# Patient Record
Sex: Female | Born: 1960 | Race: White | Hispanic: No | Smoking: Current every day smoker
Health system: Southern US, Community
[De-identification: ages and names within clinical notes are randomized; demographics above are authoritative.]

## PROBLEM LIST (undated history)

## (undated) DIAGNOSIS — J9612 Chronic respiratory failure with hypercapnia: Secondary | ICD-10-CM

## (undated) DIAGNOSIS — I82409 Acute embolism and thrombosis of unspecified deep veins of unspecified lower extremity: Secondary | ICD-10-CM

## (undated) DIAGNOSIS — J449 Chronic obstructive pulmonary disease, unspecified: Secondary | ICD-10-CM

## (undated) DIAGNOSIS — J9611 Chronic respiratory failure with hypoxia: Secondary | ICD-10-CM

## (undated) DIAGNOSIS — I639 Cerebral infarction, unspecified: Secondary | ICD-10-CM

## (undated) DIAGNOSIS — I10 Essential (primary) hypertension: Secondary | ICD-10-CM

## (undated) DIAGNOSIS — E0590 Thyrotoxicosis, unspecified without thyrotoxic crisis or storm: Secondary | ICD-10-CM

## (undated) DIAGNOSIS — G4733 Obstructive sleep apnea (adult) (pediatric): Secondary | ICD-10-CM

## (undated) DIAGNOSIS — I4891 Unspecified atrial fibrillation: Secondary | ICD-10-CM

## (undated) DIAGNOSIS — I50810 Right heart failure, unspecified: Secondary | ICD-10-CM

---

## 2018-12-29 ENCOUNTER — Other Ambulatory Visit: Payer: Self-pay

## 2018-12-29 ENCOUNTER — Emergency Department (HOSPITAL_BASED_OUTPATIENT_CLINIC_OR_DEPARTMENT_OTHER)
Admission: EM | Admit: 2018-12-29 | Discharge: 2018-12-29 | Discharge status: Against Medical Advice | Payer: Self-pay | Attending: Emergency Medicine | Admitting: Emergency Medicine

## 2018-12-29 ENCOUNTER — Emergency Department (HOSPITAL_BASED_OUTPATIENT_CLINIC_OR_DEPARTMENT_OTHER): Payer: Self-pay

## 2018-12-29 ENCOUNTER — Encounter (HOSPITAL_BASED_OUTPATIENT_CLINIC_OR_DEPARTMENT_OTHER): Payer: Self-pay | Admitting: Radiology

## 2018-12-29 DIAGNOSIS — J441 Chronic obstructive pulmonary disease with (acute) exacerbation: Secondary | ICD-10-CM | POA: Insufficient documentation

## 2018-12-29 DIAGNOSIS — Z79899 Other long term (current) drug therapy: Secondary | ICD-10-CM | POA: Insufficient documentation

## 2018-12-29 DIAGNOSIS — Z20828 Contact with and (suspected) exposure to other viral communicable diseases: Secondary | ICD-10-CM | POA: Insufficient documentation

## 2018-12-29 DIAGNOSIS — R600 Localized edema: Secondary | ICD-10-CM | POA: Insufficient documentation

## 2018-12-29 DIAGNOSIS — J9601 Acute respiratory failure with hypoxia: Secondary | ICD-10-CM | POA: Insufficient documentation

## 2018-12-29 LAB — TROPONIN I (HIGH SENSITIVITY)
Troponin I (High Sensitivity): 6 ng/L (ref <18)
Troponin I (High Sensitivity): 6 ng/L (ref <18)

## 2018-12-29 LAB — CBC WITH DIFFERENTIAL/PLATELET
Abs Immature Granulocytes: 0.02 10*3/uL (ref 0.00–0.07)
Basophils Absolute: 0 10*3/uL (ref 0.0–0.1)
Basophils Relative: 0 %
Eosinophils Absolute: 0.2 10*3/uL (ref 0.0–0.5)
Eosinophils Relative: 2 %
HCT: 50.1 % — ABNORMAL HIGH (ref 36.0–46.0)
Hemoglobin: 14.8 g/dL (ref 12.0–15.0)
Immature Granulocytes: 0 %
Lymphocytes Relative: 18 %
Lymphs Abs: 1.4 10*3/uL (ref 0.7–4.0)
MCH: 25.5 pg — ABNORMAL LOW (ref 26.0–34.0)
MCHC: 29.5 g/dL — ABNORMAL LOW (ref 30.0–36.0)
MCV: 86.2 fL (ref 80.0–100.0)
Monocytes Absolute: 0.6 10*3/uL (ref 0.1–1.0)
Monocytes Relative: 7 %
Neutro Abs: 5.6 10*3/uL (ref 1.7–7.7)
Neutrophils Relative %: 73 %
Platelets: 223 10*3/uL (ref 150–400)
RBC: 5.81 MIL/uL — ABNORMAL HIGH (ref 3.87–5.11)
RDW: 13.9 % (ref 11.5–15.5)
WBC: 7.8 10*3/uL (ref 4.0–10.5)
nRBC: 0 % (ref 0.0–0.2)

## 2018-12-29 LAB — COMPREHENSIVE METABOLIC PANEL
ALT: 18 U/L (ref 0–44)
AST: 19 U/L (ref 15–41)
Albumin: 3.6 g/dL (ref 3.5–5.0)
Alkaline Phosphatase: 74 U/L (ref 38–126)
Anion gap: 9 (ref 5–15)
BUN: 9 mg/dL (ref 6–20)
CO2: 30 mmol/L (ref 22–32)
Calcium: 8.8 mg/dL — ABNORMAL LOW (ref 8.9–10.3)
Chloride: 102 mmol/L (ref 98–111)
Creatinine, Ser: 0.55 mg/dL (ref 0.44–1.00)
GFR calc Af Amer: >60 mL/min (ref >60)
GFR calc non Af Amer: >60 mL/min (ref >60)
Glucose, Bld: 141 mg/dL — ABNORMAL HIGH (ref 70–99)
Potassium: 4 mmol/L (ref 3.5–5.1)
Sodium: 141 mmol/L (ref 135–145)
Total Bilirubin: 0.9 mg/dL (ref 0.3–1.2)
Total Protein: 6.7 g/dL (ref 6.5–8.1)

## 2018-12-29 LAB — BRAIN NATRIURETIC PEPTIDE: B Natriuretic Peptide: 51.8 pg/mL (ref 0.0–100.0)

## 2018-12-29 LAB — SARS CORONAVIRUS 2 AG (30 MIN TAT): SARS Coronavirus 2 Ag: NEGATIVE

## 2018-12-29 MED ORDER — METHYLPREDNISOLONE SODIUM SUCC 125 MG IJ SOLR
125.0000 mg | Freq: Once | INTRAMUSCULAR | Status: AC
  Administered 2018-12-29: 19:00:00 125 mg via INTRAVENOUS
  Filled 2018-12-29: qty 2

## 2018-12-29 MED ORDER — IOHEXOL 350 MG/ML SOLN
100.0000 mL | Freq: Once | INTRAVENOUS | Status: AC | PRN
  Administered 2018-12-29: 100 mL via INTRAVENOUS

## 2018-12-29 MED ORDER — ALBUTEROL SULFATE HFA 108 (90 BASE) MCG/ACT IN AERS
8.0000 | INHALATION_SPRAY | Freq: Once | RESPIRATORY_TRACT | Status: AC
  Administered 2018-12-29: 19:00:00 8 via RESPIRATORY_TRACT
  Filled 2018-12-29: qty 6.7

## 2018-12-29 MED ORDER — PREDNISONE 20 MG PO TABS: 60.0000 mg | ORAL_TABLET | Freq: Every day | ORAL | 0 refills | Status: AC

## 2018-12-29 NOTE — ED Notes (Signed)
Pt on monitor 

## 2018-12-29 NOTE — ED Notes (Addendum)
Attempted to place 20g IV to RAC- unsuccessful. Ct informed

## 2018-12-29 NOTE — ED Notes (Signed)
Pt refusing to stay attached to monitor- states "I am ready to go, I am done with all of this". Pt explained we were monitoring her vitals due to the hypoxia earlier. RN explained she was waiting on ultrasound and she was next. Pt states "I want to go now" EDP informed.

## 2018-12-29 NOTE — ED Triage Notes (Signed)
Pt reports left ankle edema x 2 months, intermittent shortness of breath with ambulation, heavy smoker . denies chest pain nor cough , no fever. Alert and oriented x 4. Pt got hypoxic after walking from triage to treatment room

## 2018-12-29 NOTE — ED Notes (Signed)
ED Provider at bedside. 

## 2018-12-29 NOTE — ED Provider Notes (Signed)
[facility] EMERGENCY DEPARTMENT Provider Note   CSN: [id number] Arrival date & time: 12/29/18  1614    History   Chief Complaint Chief Complaint  Patient presents with  . Shortness of Breath  . Ankle Pain    edema    HPI Yvette MolaSharon Booth is a 58 y.o. female.     The history is provided by the patient.  Shortness of Breath Severity:  Moderate Onset quality:  Gradual Timing:  Constant Progression:  Unchanged Chronicity:  New Context: activity and smoke exposure   Relieved by:  Inhaler Worsened by:  Activity Associated symptoms: wheezing   Associated symptoms: no abdominal pain, no chest pain, no cough, no ear pain, no fever, no rash, no sore throat and no vomiting   Associated symptoms comment:  Left lower leg swelling for the last several months as well Risk factors: no hx of PE/DVT     History reviewed. No pertinent past medical history.  There are no active problems to display for this patient.   History reviewed. No pertinent surgical history.   OB History   No obstetric history on file.      Home Medications    Prior to Admission medications   Medication Sig Start Date End Date Taking? Authorizing Provider  predniSONE (DELTASONE) 20 MG tablet Take 3 tablets (60 mg total) by mouth daily for 5 days. 12/29/18 01/03/19  Virgina Norfolkuratolo, Tresa Jolley, DO    Family History No family history on file.  Social History Social History   Tobacco Use  . Smoking status: Not on file  Substance Use Topics  . Alcohol use: Not on file  . Drug use: Not on file     Allergies   Patient has no known allergies.   Review of Systems Review of Systems  Constitutional: Negative for chills and fever.  HENT: Negative for ear pain and sore throat.   Eyes: Negative for pain and visual disturbance.  Respiratory: Positive for shortness of breath and wheezing. Negative for cough.   Cardiovascular: Positive for leg swelling. Negative for chest pain and palpitations.   Gastrointestinal: Negative for abdominal pain and vomiting.  Genitourinary: Negative for dysuria and hematuria.  Musculoskeletal: Negative for arthralgias and back pain.  Skin: Negative for color change and rash.  Neurological: Negative for seizures and syncope.  All other systems reviewed and are negative.    Physical Exam Updated Vital Signs  ED Triage Vitals  Enc Vitals Group     BP 12/29/18 1636 (!) 159/82     Pulse Rate 12/29/18 1636 99     Resp 12/29/18 1636 (!) 32     Temp 12/29/18 1636 99.1 F (37.3 C)     Temp Source 12/29/18 1636 Oral     SpO2 12/29/18 1636 98 %     Weight --      Height --      Head Circumference --      Peak Flow --      Pain Score 12/29/18 1646 0     Pain Loc --      Pain Edu? --      Excl. in GC? --     Physical Exam Vitals signs and nursing note reviewed.  Constitutional:      General: She is not in acute distress.    Appearance: She is well-developed.  HENT:     Head: Normocephalic and atraumatic.     Mouth/Throat:     Mouth: Mucous membranes are moist.  Eyes:  Conjunctiva/sclera: Conjunctivae normal.     Pupils: Pupils are equal, round, and reactive to light.  Neck:     Musculoskeletal: Normal range of motion and neck supple.  Cardiovascular:     Rate and Rhythm: Normal rate and regular rhythm.     Pulses: Normal pulses.     Heart sounds: Normal heart sounds. No murmur.  Pulmonary:     Effort: Tachypnea present. No respiratory distress.     Breath sounds: Decreased breath sounds and wheezing present.  Abdominal:     Palpations: Abdomen is soft.     Tenderness: There is no abdominal tenderness.  Musculoskeletal:     Right lower leg: She exhibits no tenderness. No edema.     Left lower leg: She exhibits tenderness. Edema present.  Skin:    General: Skin is warm and dry.     Capillary Refill: Capillary refill takes less than 2 seconds.  Neurological:     General: No focal deficit present.     Mental Status: She is  alert.      ED Treatments / Results  Labs (all labs ordered are listed, but only abnormal results are displayed) Labs Reviewed  CBC WITH DIFFERENTIAL/PLATELET - Abnormal; Notable for the following components:      Result Value   RBC 5.81 (*)    HCT 50.1 (*)    MCH 25.5 (*)    MCHC 29.5 (*)    All other components within normal limits  COMPREHENSIVE METABOLIC PANEL - Abnormal; Notable for the following components:   Glucose, Bld 141 (*)    Calcium 8.8 (*)    All other components within normal limits  SARS CORONAVIRUS 2 (HOSP ORDER, PERFORMED IN [place] LAB VIA ABBOTT ID)  TROPONIN I (HIGH SENSITIVITY)  BRAIN NATRIURETIC PEPTIDE  TROPONIN I (HIGH SENSITIVITY)    EKG EKG Interpretation  Date/Time:  Thursday December 29 2018 16:34:23 EDT Ventricular Rate:  101 PR Interval:    QRS Duration: 81 QT Interval:  351 QTC Calculation: 455 R Axis:   97 Text Interpretation:  Sinus tachycardia Biatrial enlargement Borderline right axis deviation Probable anteroseptal infarct, old Confirmed by Virgina NorfolkAdam, Sherwood Castilla [phone]([id number]) on 12/29/2018 5:04:05 PM   Radiology Ct Angio Chest Pe W And/or Wo Contrast  Result Date: 12/29/2018 CLINICAL DATA:  Chest pain EXAM: CT ANGIOGRAPHY CHEST WITH CONTRAST TECHNIQUE: Multidetector CT imaging of the chest was performed using the standard protocol during bolus administration of intravenous contrast. Multiplanar CT image reconstructions and MIPs were obtained to evaluate the vascular anatomy. CONTRAST:  100mL OMNIPAQUE IOHEXOL 350 MG/ML SOLN COMPARISON:  Radiograph 12/29/2018 FINDINGS: Cardiovascular: Satisfactory opacification of the pulmonary arteries to the segmental level. No evidence of pulmonary embolism. Normal heart size. No pericardial effusion. Nonaneurysmal aorta. No dissection is seen. Mediastinum/Nodes: Midline trachea. Heterogenous thyroid with multiple nodules, measuring up to 2.3 cm on the left. Scattered parenchymal calcifications. No significantly  enlarged lymph nodes. Esophagus within normal limits. Lungs/Pleura: Severe emphysema. No acute airspace disease or effusion. Subpleural 4 mm right lower lobe pulmonary nodule, series 5, image number 58. Upper Abdomen: 15 mm fat density left adrenal mass. 2.2 cm fat density right adrenal mass. No acute abnormalities in the upper abdomen. Musculoskeletal: No chest wall abnormality. No acute or significant osseous findings. Review of the MIP images confirms the above findings. IMPRESSION: 1. No CT evidence for acute pulmonary embolus or aortic dissection. 2. Severe emphysema without acute pulmonary infiltrate. 3. 4 mm right lower lobe pulmonary nodule. No follow-up needed if patient  is low-risk. Non-contrast chest CT can be considered in 12 months if patient is high-risk. This recommendation follows the consensus statement: Guidelines for Management of Incidental Pulmonary Nodules Detected on CT Images: From the Fleischner Society 2017; Radiology 2017; 284:228-243. 4. Heterogenous thyroid with multiple nodules, consider correlation with nonemergent thyroid ultrasound 5. Bilateral adrenal masses, consistent with adenoma Emphysema (ICD10-J43.9). Electronically Signed   By: Jasmine PangKim  Fujinaga M.D.   On: 12/29/2018 18:24   Dg Chest Portable 1 View  Result Date: 12/29/2018 CLINICAL DATA:  Shortness of breath. EXAM: PORTABLE CHEST 1 VIEW COMPARISON:  None. FINDINGS: 1643 hours. Lungs are hyperexpanded. Interstitial markings are diffusely coarsened with chronic features. Cardiopericardial silhouette is at upper limits of normal for size. Hila are prominent bilaterally although likely related to vasculature. Bones are diffusely demineralized. Telemetry leads overlie the chest. IMPRESSION: 1. Hyperexpansion with underlying chronic interstitial changes. No definite acute cardiopulmonary process. Electronically Signed   By: Kennith CenterEric  Mansell M.D.   On: 12/29/2018 17:50    Procedures .Critical Care Performed by: Virgina Norfolkuratolo, Jereld Presti, DO  Authorized by: Virgina Norfolkuratolo, Shaniah Baltes, DO   Critical care provider statement:    Critical care time (minutes):  35   Critical care time was exclusive of:  Separately billable procedures and treating other patients and teaching time   Critical care was necessary to treat or prevent imminent or life-threatening deterioration of the following conditions:  Respiratory failure   Critical care was time spent personally by me on the following activities:  Blood draw for specimens, development of treatment plan with patient or surrogate, discussions with primary provider, evaluation of patient's response to treatment, examination of patient, obtaining history from patient or surrogate, ordering and performing treatments and interventions, ordering and review of laboratory studies, ordering and review of radiographic studies, pulse oximetry, re-evaluation of patient's condition and review of old charts   I assumed direction of critical care for this patient from another provider in my specialty: no     (including critical care time)  Medications Ordered in ED Medications  iohexol (OMNIPAQUE) 350 MG/ML injection 100 mL (100 mLs Intravenous Contrast Given 12/29/18 1752)  albuterol (VENTOLIN HFA) 108 (90 Base) MCG/ACT inhaler 8 puff (8 puffs Inhalation Given 12/29/18 1852)  methylPREDNISolone sodium succinate (SOLU-MEDROL) 125 mg/2 mL injection 125 mg (125 mg Intravenous Given 12/29/18 1849)     Initial Impression / Assessment and Plan / ED Course  I have reviewed the triage vital signs and the nursing notes.  Pertinent labs & imaging results that were available during my care of the patient were reviewed by me and considered in my medical decision making (see chart for details).     Yvette MolaSharon Slaugh is a 58 year old female history of thyroid disorder, COPD who presents to the ED with shortness of breath, left leg swelling.  Patient found to be hypoxic and improved on 4 L of oxygen.  Patient with wheezing throughout  on exam.  No fever, no cough.  Patient has had left lower leg swelling for the last several months.  Has not seek medical attention for this.  Patient continues to smoke daily.  Has been using inhaler with no relief.  No history of blood clots.  No chest pain.  EKG shows sinus tachycardia.  No ischemic changes.  Concern for possible PE given left leg swelling, shortness of breath.  Possible COPD exacerbation.  Will give albuterol.  Will start with a CT scan of the chest.  May need a DVT study.  Less likely infectious process  in the left lower leg given the duration of symptoms.  No fever.  PE scan shows no pneumonia, no PE.  Emphysema changes.  Patient has some pulmonary nodules that she was made aware of and will need follow-up CT scans.  Patient with no significant leukocytosis, anemia.  No significant kidney injury.  BNP normal.  Doubt heart failure.  Troponin within normal limits.  Doubt ACS.  Coronavirus test negative.  Will give further breathing treatments, IV Solu-Medrol.  Patient still continues to require oxygen.  Will get a DVT study.  Patient does not want to stay for admission for COPD despite new oxygen requirement.  Will reevaluate her after DVT scan and further breathing treatments.  Patient wants to leave [place].  She does not want a stay for DVT scan.  She does not want to be admitted for acute hypoxic respiratory failure likely due to COPD exacerbation.  She has capacity to make this decision.  Will give patient prednisone prescription.  Recommend that she aggressively use her inhaler.  Told her to come back to the ED if she changes her mind.  Patient discharged [place].  This chart was dictated using voice recognition software.  Despite best efforts to proofread,  errors can occur which can change the documentation meaning.    Final Clinical Impressions(s) / ED Diagnoses   Final diagnoses:  COPD exacerbation ([place])  Acute respiratory failure with hypoxia  [facility])    ED Discharge Orders         Ordered    predniSONE (DELTASONE) 20 MG tablet  Daily     12/29/18 1947           Trigo Winterbottom, [place], DO 12/29/18 1949

## 2018-12-29 NOTE — ED Notes (Signed)
Provided discharge paperwork despite AMA status, given prednisone RX. Encouraged patient to follow up with PCP as soon as possible, and to call 911 if she feels like she felt earlier today. Pt escorted to check out area via wheelchair by EMT.

## 2019-11-05 IMAGING — CT CT ANGIOGRAPHY CHEST
1 of 8 series · 3 of 16 positions shown · IV contrast (omnipaque)
Comparison: Radiograph 12/29/2018

CLINICAL DATA: Chest pain

EXAM:
CT ANGIOGRAPHY CHEST WITH CONTRAST
TECHNIQUE: Multidetector CT imaging of the chest was performed using the
standard protocol during bolus administration of intravenous
contrast. Multiplanar CT image reconstructions and MIPs were
obtained to evaluate the vascular anatomy.
CONTRAST:  100mL OMNIPAQUE IOHEXOL 350 MG/ML SOLN

[Series 6: pe thins · axial · 0.80mm/px · z∈[-304,-3]mm · 3 of 302 slices shown]
[im 1/302  lung]
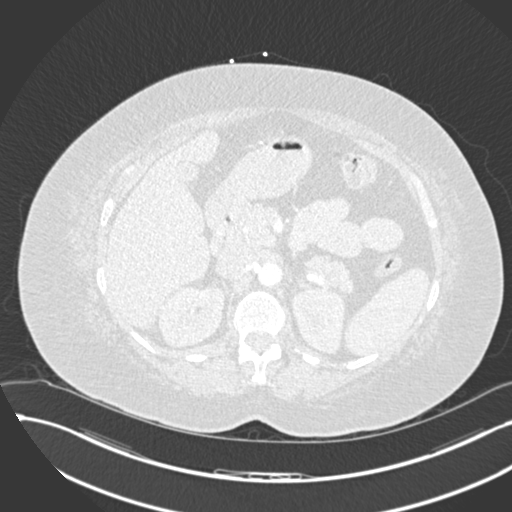
[im 151/302  soft-tissue]
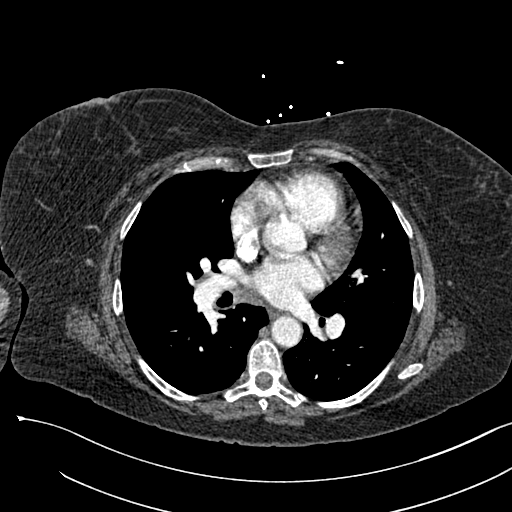
[im 302/302  lung]
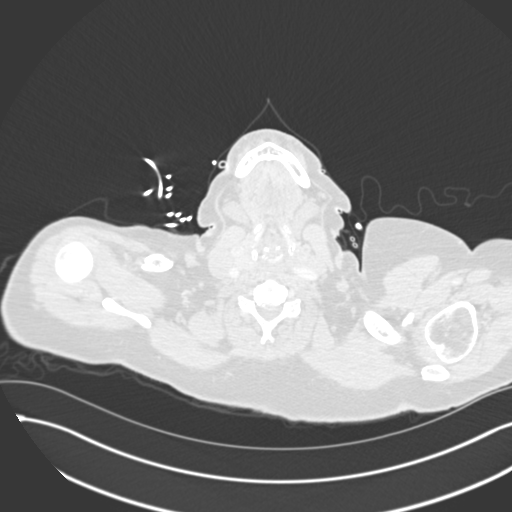

[3 of 16 positions shown; findings below may reference images not displayed]

FINDINGS: Cardiovascular: Satisfactory opacification of the pulmonary arteries
to the segmental level. No evidence of pulmonary embolism. Normal
heart size. No pericardial effusion. Nonaneurysmal aorta. No
dissection is seen.

Mediastinum/Nodes: Midline trachea. Heterogenous thyroid with
multiple nodules, measuring up to 2.3 cm on the left. Scattered
parenchymal calcifications. No significantly enlarged lymph nodes.
Esophagus within normal limits.

Lungs/Pleura: Severe emphysema. No acute airspace disease or
effusion. Subpleural 4 mm right lower lobe pulmonary nodule, series
5, image number 58.

Upper Abdomen: 15 mm fat density left adrenal mass. 2.2 cm fat
density right adrenal mass. No acute abnormalities in the upper
abdomen.

Musculoskeletal: No chest wall abnormality. No acute or significant
osseous findings.

Review of the MIP images confirms the above findings.
IMPRESSION: 1. No CT evidence for acute pulmonary embolus or aortic dissection.
2. Severe emphysema without acute pulmonary infiltrate.
3. 4 mm right lower lobe pulmonary nodule. No follow-up needed if
patient is low-risk. Non-contrast chest CT can be considered in 12
months if patient is high-risk. This recommendation follows the
consensus statement: Guidelines for Management of Incidental
Pulmonary Nodules Detected on CT Images: From the [HOSPITAL]
4. Heterogenous thyroid with multiple nodules, consider correlation
with nonemergent thyroid ultrasound
5. Bilateral adrenal masses, consistent with adenoma

Emphysema ([id number]).

## 2023-08-20 ENCOUNTER — Encounter (HOSPITAL_COMMUNITY): Payer: Self-pay | Admitting: Internal Medicine

## 2023-08-20 ENCOUNTER — Emergency Department (HOSPITAL_COMMUNITY): Payer: Medicare HMO

## 2023-08-20 ENCOUNTER — Inpatient Hospital Stay (HOSPITAL_COMMUNITY)
Admission: EM | Admit: 2023-08-20 | Discharge: 2023-08-23 | DRG: 189 | Disposition: A | Discharge status: Home / Self Care | Payer: Medicare HMO | Attending: Internal Medicine | Admitting: Internal Medicine

## 2023-08-20 ENCOUNTER — Other Ambulatory Visit: Payer: Self-pay

## 2023-08-20 DIAGNOSIS — I110 Hypertensive heart disease with heart failure: Secondary | ICD-10-CM | POA: Diagnosis present

## 2023-08-20 DIAGNOSIS — Z7901 Long term (current) use of anticoagulants: Secondary | ICD-10-CM | POA: Diagnosis not present

## 2023-08-20 DIAGNOSIS — G4733 Obstructive sleep apnea (adult) (pediatric): Secondary | ICD-10-CM | POA: Diagnosis present

## 2023-08-20 DIAGNOSIS — E0590 Thyrotoxicosis, unspecified without thyrotoxic crisis or storm: Secondary | ICD-10-CM | POA: Diagnosis present

## 2023-08-20 DIAGNOSIS — J439 Emphysema, unspecified: Secondary | ICD-10-CM | POA: Diagnosis present

## 2023-08-20 DIAGNOSIS — Z1152 Encounter for screening for COVID-19: Secondary | ICD-10-CM | POA: Diagnosis not present

## 2023-08-20 DIAGNOSIS — J9622 Acute and chronic respiratory failure with hypercapnia: Principal | ICD-10-CM | POA: Diagnosis present

## 2023-08-20 DIAGNOSIS — J9621 Acute and chronic respiratory failure with hypoxia: Secondary | ICD-10-CM | POA: Diagnosis present

## 2023-08-20 DIAGNOSIS — J441 Chronic obstructive pulmonary disease with (acute) exacerbation: Secondary | ICD-10-CM | POA: Diagnosis not present

## 2023-08-20 DIAGNOSIS — T380X5A Adverse effect of glucocorticoids and synthetic analogues, initial encounter: Secondary | ICD-10-CM | POA: Diagnosis not present

## 2023-08-20 DIAGNOSIS — Z8673 Personal history of transient ischemic attack (TIA), and cerebral infarction without residual deficits: Secondary | ICD-10-CM

## 2023-08-20 DIAGNOSIS — Z716 Tobacco abuse counseling: Secondary | ICD-10-CM

## 2023-08-20 DIAGNOSIS — R739 Hyperglycemia, unspecified: Secondary | ICD-10-CM | POA: Diagnosis not present

## 2023-08-20 DIAGNOSIS — I50810 Right heart failure, unspecified: Secondary | ICD-10-CM | POA: Diagnosis present

## 2023-08-20 DIAGNOSIS — I480 Paroxysmal atrial fibrillation: Secondary | ICD-10-CM | POA: Diagnosis present

## 2023-08-20 DIAGNOSIS — F17210 Nicotine dependence, cigarettes, uncomplicated: Secondary | ICD-10-CM | POA: Diagnosis present

## 2023-08-20 DIAGNOSIS — Z79899 Other long term (current) drug therapy: Secondary | ICD-10-CM | POA: Diagnosis not present

## 2023-08-20 HISTORY — DX: Chronic respiratory failure with hypoxia: J96.11

## 2023-08-20 HISTORY — DX: Chronic respiratory failure with hypercapnia: J96.12

## 2023-08-20 HISTORY — DX: Unspecified atrial fibrillation: I48.91

## 2023-08-20 HISTORY — DX: Essential (primary) hypertension: I10

## 2023-08-20 HISTORY — DX: Acute embolism and thrombosis of unspecified deep veins of unspecified lower extremity: I82.409

## 2023-08-20 HISTORY — DX: Obstructive sleep apnea (adult) (pediatric): G47.33

## 2023-08-20 HISTORY — DX: Chronic obstructive pulmonary disease, unspecified: J44.9

## 2023-08-20 HISTORY — DX: Thyrotoxicosis, unspecified without thyrotoxic crisis or storm: E05.90

## 2023-08-20 HISTORY — DX: Cerebral infarction, unspecified: I63.9

## 2023-08-20 HISTORY — DX: Right heart failure, unspecified: I50.810

## 2023-08-20 LAB — COMPREHENSIVE METABOLIC PANEL
ALT: 9 U/L (ref 0–44)
AST: 11 U/L — ABNORMAL LOW (ref 15–41)
Albumin: 3.7 g/dL (ref 3.5–5.0)
Alkaline Phosphatase: 67 U/L (ref 38–126)
Anion gap: 9 (ref 5–15)
BUN: 15 mg/dL (ref 8–23)
CO2: 42 mmol/L — ABNORMAL HIGH (ref 22–32)
Calcium: 9 mg/dL (ref 8.9–10.3)
Chloride: 90 mmol/L — ABNORMAL LOW (ref 98–111)
Creatinine, Ser: 0.64 mg/dL (ref 0.44–1.00)
GFR, Estimated: >60 mL/min (ref >60)
Glucose, Bld: 135 mg/dL — ABNORMAL HIGH (ref 70–99)
Potassium: 4.4 mmol/L (ref 3.5–5.1)
Sodium: 141 mmol/L (ref 135–145)
Total Bilirubin: 0.5 mg/dL (ref 0.0–1.2)
Total Protein: 6.9 g/dL (ref 6.5–8.1)

## 2023-08-20 LAB — CBC WITH DIFFERENTIAL/PLATELET
Abs Immature Granulocytes: 0.02 10*3/uL (ref 0.00–0.07)
Basophils Absolute: 0 10*3/uL (ref 0.0–0.1)
Basophils Relative: 0 %
Eosinophils Absolute: 0.1 10*3/uL (ref 0.0–0.5)
Eosinophils Relative: 1 %
HCT: 49.3 % — ABNORMAL HIGH (ref 36.0–46.0)
Hemoglobin: 15 g/dL (ref 12.0–15.0)
Immature Granulocytes: 0 %
Lymphocytes Relative: 21 %
Lymphs Abs: 1.4 10*3/uL (ref 0.7–4.0)
MCH: 29.2 pg (ref 26.0–34.0)
MCHC: 30.4 g/dL (ref 30.0–36.0)
MCV: 96.1 fL (ref 80.0–100.0)
Monocytes Absolute: 0.4 10*3/uL (ref 0.1–1.0)
Monocytes Relative: 6 %
Neutro Abs: 4.7 10*3/uL (ref 1.7–7.7)
Neutrophils Relative %: 72 %
Platelets: 229 10*3/uL (ref 150–400)
RBC: 5.13 MIL/uL — ABNORMAL HIGH (ref 3.87–5.11)
RDW: 13 % (ref 11.5–15.5)
WBC: 6.6 10*3/uL (ref 4.0–10.5)
nRBC: 0 % (ref 0.0–0.2)

## 2023-08-20 LAB — BLOOD GAS, VENOUS
Acid-Base Excess: 16.9 mmol/L — ABNORMAL HIGH (ref 0.0–2.0)
Bicarbonate: 48.6 mmol/L — ABNORMAL HIGH (ref 20.0–28.0)
O2 Saturation: 68.4 %
Patient temperature: 37
pCO2, Ven: 101 mm[Hg] (ref 44–60)
pH, Ven: 7.29 (ref 7.25–7.43)
pO2, Ven: 32 mm[Hg] (ref 32–45)

## 2023-08-20 LAB — TROPONIN I (HIGH SENSITIVITY)
Troponin I (High Sensitivity): 3 ng/L (ref <18)
Troponin I (High Sensitivity): 3 ng/L (ref <18)

## 2023-08-20 LAB — RESP PANEL BY RT-PCR (RSV, FLU A&B, COVID)  RVPGX2
Influenza A by PCR: NEGATIVE
Influenza B by PCR: NEGATIVE
Resp Syncytial Virus by PCR: NEGATIVE
SARS Coronavirus 2 by RT PCR: NEGATIVE

## 2023-08-20 MED ORDER — IPRATROPIUM-ALBUTEROL 0.5-2.5 (3) MG/3ML IN SOLN
3.0000 mL | Freq: Once | RESPIRATORY_TRACT | Status: AC
Start: 2023-08-20 — End: 2023-08-20
  Administered 2023-08-20: 3 mL via RESPIRATORY_TRACT
  Filled 2023-08-20: qty 3

## 2023-08-20 MED ORDER — METHYLPREDNISOLONE SODIUM SUCC 125 MG IJ SOLR
125.0000 mg | Freq: Once | INTRAMUSCULAR | Status: AC
Start: 2023-08-20 — End: 2023-08-20
  Administered 2023-08-20: 125 mg via INTRAVENOUS
  Filled 2023-08-20: qty 2

## 2023-08-20 MED ORDER — SODIUM CHLORIDE 0.9 % IV SOLN
500.0000 mg | Freq: Once | INTRAVENOUS | Status: AC
  Administered 2023-08-20: 500 mg via INTRAVENOUS
  Filled 2023-08-20: qty 5

## 2023-08-20 MED ORDER — UMECLIDINIUM BROMIDE 62.5 MCG/ACT IN AEPB
1.0000 | INHALATION_SPRAY | Freq: Every day | RESPIRATORY_TRACT | Status: DC
Start: 2023-08-21 — End: 2023-08-23
  Administered 2023-08-21 – 2023-08-23 (×3): 1 via RESPIRATORY_TRACT
  Filled 2023-08-20: qty 7

## 2023-08-20 MED ORDER — ATORVASTATIN CALCIUM 10 MG PO TABS
10.0000 mg | ORAL_TABLET | Freq: Every day | ORAL | Status: DC
  Administered 2023-08-21 – 2023-08-23 (×3): 10 mg via ORAL
  Filled 2023-08-20 (×3): qty 1

## 2023-08-20 MED ORDER — NICOTINE POLACRILEX 2 MG MT GUM: 2.0000 mg | CHEWING_GUM | OROMUCOSAL | Status: DC | PRN

## 2023-08-20 MED ORDER — NICOTINE 14 MG/24HR TD PT24
14.0000 mg | MEDICATED_PATCH | Freq: Every day | TRANSDERMAL | Status: DC
  Administered 2023-08-21 – 2023-08-23 (×3): 14 mg via TRANSDERMAL
  Filled 2023-08-20 (×3): qty 1

## 2023-08-20 MED ORDER — ONDANSETRON HCL 4 MG/2ML IJ SOLN: 4.0000 mg | Freq: Four times a day (QID) | INTRAMUSCULAR | Status: DC | PRN

## 2023-08-20 MED ORDER — IPRATROPIUM-ALBUTEROL 0.5-2.5 (3) MG/3ML IN SOLN: 9.0000 mL | Freq: Once | RESPIRATORY_TRACT | Status: DC

## 2023-08-20 MED ORDER — FLUTICASONE FUROATE-VILANTEROL 200-25 MCG/ACT IN AEPB
1.0000 | INHALATION_SPRAY | Freq: Every day | RESPIRATORY_TRACT | Status: DC
  Administered 2023-08-21 – 2023-08-23 (×3): 1 via RESPIRATORY_TRACT
  Filled 2023-08-20: qty 28

## 2023-08-20 MED ORDER — MELATONIN 3 MG PO TABS
6.0000 mg | ORAL_TABLET | Freq: Every evening | ORAL | Status: DC | PRN
  Administered 2023-08-21: 6 mg via ORAL
  Filled 2023-08-20: qty 2

## 2023-08-20 MED ORDER — SODIUM CHLORIDE 0.9 % IV SOLN
1.0000 g | Freq: Once | INTRAVENOUS | Status: AC
  Administered 2023-08-20: 1 g via INTRAVENOUS
  Filled 2023-08-20: qty 10

## 2023-08-20 MED ORDER — MONTELUKAST SODIUM 10 MG PO TABS
10.0000 mg | ORAL_TABLET | Freq: Every day | ORAL | Status: DC
  Administered 2023-08-21 – 2023-08-22 (×2): 10 mg via ORAL
  Filled 2023-08-20 (×2): qty 1

## 2023-08-20 MED ORDER — APIXABAN 5 MG PO TABS
5.0000 mg | ORAL_TABLET | Freq: Two times a day (BID) | ORAL | Status: DC
  Administered 2023-08-21 – 2023-08-23 (×5): 5 mg via ORAL
  Filled 2023-08-20 (×5): qty 1

## 2023-08-20 MED ORDER — METOPROLOL SUCCINATE ER 50 MG PO TB24
50.0000 mg | ORAL_TABLET | Freq: Every day | ORAL | Status: DC
  Administered 2023-08-21 – 2023-08-23 (×3): 50 mg via ORAL
  Filled 2023-08-20 (×3): qty 1

## 2023-08-20 MED ORDER — POLYETHYLENE GLYCOL 3350 17 G PO PACK: 17.0000 g | PACK | Freq: Every day | ORAL | Status: DC | PRN

## 2023-08-20 MED ORDER — LEVOFLOXACIN 500 MG PO TABS
500.0000 mg | ORAL_TABLET | Freq: Every day | ORAL | Status: DC
  Administered 2023-08-21 – 2023-08-22 (×2): 500 mg via ORAL
  Filled 2023-08-20 (×2): qty 1

## 2023-08-20 MED ORDER — IPRATROPIUM-ALBUTEROL 0.5-2.5 (3) MG/3ML IN SOLN
3.0000 mL | Freq: Four times a day (QID) | RESPIRATORY_TRACT | Status: DC
  Administered 2023-08-20 – 2023-08-23 (×10): 3 mL via RESPIRATORY_TRACT
  Filled 2023-08-20 (×12): qty 3

## 2023-08-20 MED ORDER — METHYLPREDNISOLONE SODIUM SUCC 125 MG IJ SOLR
60.0000 mg | Freq: Two times a day (BID) | INTRAMUSCULAR | Status: DC
  Administered 2023-08-21 – 2023-08-23 (×5): 60 mg via INTRAVENOUS
  Filled 2023-08-20 (×6): qty 2

## 2023-08-20 MED ORDER — ACETAMINOPHEN 500 MG PO TABS: 1000.0000 mg | ORAL_TABLET | Freq: Four times a day (QID) | ORAL | Status: DC | PRN

## 2023-08-20 MED ORDER — ALBUTEROL SULFATE (2.5 MG/3ML) 0.083% IN NEBU: 2.5000 mg | INHALATION_SOLUTION | RESPIRATORY_TRACT | Status: DC | PRN

## 2023-08-20 MED ORDER — SODIUM CHLORIDE 0.9% FLUSH
3.0000 mL | Freq: Two times a day (BID) | INTRAVENOUS | Status: DC
  Administered 2023-08-20 – 2023-08-23 (×6): 3 mL via INTRAVENOUS

## 2023-08-20 MED ORDER — METHIMAZOLE 10 MG PO TABS
10.0000 mg | ORAL_TABLET | Freq: Two times a day (BID) | ORAL | Status: DC
  Administered 2023-08-21 – 2023-08-23 (×5): 10 mg via ORAL
  Filled 2023-08-20 (×7): qty 1

## 2023-08-20 NOTE — ED Provider Notes (Signed)
EMERGENCY DEPARTMENT AT [facility] Provider Note   CSN: [id number] Arrival date & time: 08/20/23  1906     History  Chief Complaint  Patient presents with   Shortness of Breath    Yvette Booth is a 63 y.o. female history of COPD, on 2 L nasal cannula, A-fib not currently anticoagulated, hypertension, diabetes, history of DVT that was provoked presented with shortness of breath is been present for the past 2 days.  Patient does endorse sick contacts but does not know what they have had.  Patient she is coughing up sputum but cannot say whether or not it is clear or colored.  Patient denies any fevers.  Patient arrived via EMS after receiving 2 DuoNeb's and was on 4 L nonrebreather and was 86% room air with them.  Patient denies any chest pain but states she is wheezing.  Patient does not know if she is currently on Eliquis.  Home Medications Prior to Admission medications   Not on File      Allergies    Patient has no known allergies.    Review of Systems   Review of Systems  Respiratory:  Positive for shortness of breath.     Physical Exam Updated Vital Signs BP (!) 145/123   Pulse (!) 104   Temp 98.8 F (37.1 C) (Oral)   Resp (!) 32   SpO2 95%  Physical Exam Constitutional:      General: She is in acute distress.  Cardiovascular:     Rate and Rhythm: Normal rate and regular rhythm.     Pulses: Normal pulses.     Heart sounds: Normal heart sounds.  Pulmonary:     Effort: No respiratory distress.     Breath sounds: Wheezing present.     Comments: Able to speak in full sentences On nonrebreather 8 L Nonproductive cough Musculoskeletal:     Right lower leg: No tenderness. No edema.     Left lower leg: No tenderness. No edema.  Skin:    General: Skin is warm and dry.  Neurological:     Mental Status: She is alert.  Psychiatric:        Mood and Affect: Mood normal.     ED Results / Procedures / Treatments   Labs (all labs ordered are  listed, but only abnormal results are displayed) Labs Reviewed  COMPREHENSIVE METABOLIC PANEL - Abnormal; Notable for the following components:      Result Value   Chloride 90 (*)    CO2 42 (*)    Glucose, Bld 135 (*)    AST 11 (*)    All other components within normal limits  CBC WITH DIFFERENTIAL/PLATELET - Abnormal; Notable for the following components:   RBC 5.13 (*)    HCT 49.3 (*)    All other components within normal limits  RESP PANEL BY RT-PCR (RSV, FLU A&B, COVID)  [id number]  TROPONIN I (HIGH SENSITIVITY)  TROPONIN I (HIGH SENSITIVITY)    EKG None  Radiology DG Chest Portable 1 View Result Date: 08/20/2023 CLINICAL DATA:  Dyspnea EXAM: PORTABLE CHEST 1 VIEW COMPARISON:  04/23/2023 FINDINGS: Lungs are symmetrically well inflated. Paucity of vasculature in the lung apices is in keeping with underlying emphysema. No confluent pulmonary infiltrate. No pneumothorax or pleural effusion. Cardiac size within normal limits. Pulmonary vascularity is otherwise normal. No acute bone abnormality. IMPRESSION: 1. Emphysema. No active disease. Electronically Signed   By: Helyn Numbers M.D.   On: 08/20/2023 20:46  Procedures .Critical Care  Performed by: Netta Corrigan, PA-C Authorized by: Netta Corrigan, PA-C   Critical care provider statement:    Critical care time (minutes):  40   Critical care time was exclusive of:  Separately billable procedures and treating other patients   Critical care was necessary to treat or prevent imminent or life-threatening deterioration of the following conditions:  Respiratory failure   Critical care was time spent personally by me on the following activities:  Blood draw for specimens, development of treatment plan with patient or surrogate, discussions with consultants, evaluation of patient's response to treatment, examination of patient, obtaining history from patient or surrogate, review of old charts, re-evaluation of patient's condition, pulse  oximetry, ordering and review of laboratory studies, ordering and performing treatments and interventions and ordering and review of radiographic studies   I assumed direction of critical care for this patient from another provider in my specialty: no     Care discussed with: admitting provider       Medications Ordered in ED Medications  azithromycin (ZITHROMAX) 500 mg in sodium chloride 0.9 % 250 mL IVPB (500 mg Intravenous New Bag/Given 08/20/23 2023)  methylPREDNISolone sodium succinate (SOLU-MEDROL) 125 mg/2 mL injection 125 mg (125 mg Intravenous Given 08/20/23 1947)  ipratropium-albuterol (DUONEB) 0.5-2.5 (3) MG/3ML nebulizer solution 3 mL (3 mLs Nebulization Given 08/20/23 1947)  cefTRIAXone (ROCEPHIN) 1 g in sodium chloride 0.9 % 100 mL IVPB (0 g Intravenous Stopped 08/20/23 2023)    ED Course/ Medical Decision Making/ A&P                                 Medical Decision Making Amount and/or Complexity of Data Reviewed Labs: ordered. Radiology: ordered.  Risk Prescription drug management.   Yvette Booth 63 y.o. presented today for shortness of breath.  Working DDx that I considered at this time includes, but not limited to, asthma/COPD exacerbation, URI, viral illness, anemia, ACS, PE, pneumonia, pleural effusion, lung cancer, CHF, medication side effect, intoxication.  R/o DDx: COPD exacerbation, URI, anemia, ACS, PE, pneumonia, pleural effusion, lung cancer, CHF, respiratory distress, medication side effect, intoxication: These are considered less likely due to history of present illness, physical exam, labs/imaging findings  Review of prior external notes: 08/17/2023 office visit  Unique Tests and My Independent Interpretation:  CBC: Unremarkable CMP: Hypercapnia 42, slightly chronic due to COPD EKG: Sinus 90 bpm, PVCs noted but otherwise no signs of ST elevations or depressions indicative of ischemia Troponin: 3 CXR: No acute findings Respiratory Panel:  Negative  Social Determinants of Health: none  Discussion with Independent Historian:  EMS  Discussion of Management of Tests:  [place], MD Hospitalist  Risk: High: hospitalization or escalation of hospital-level care  Risk Stratification Score: None  Plan: On exam patient was in acute distress on 8 L nonrebreather when I evaluated her.  Patient was reportedly hypoxic with EMS on room air 86% however patient states that she normally wears 2 L.  Triage note states that patient normally wears 4 L however patient tells me 2 L. Physical exam showed able to speak in full sentences but does have increased work of breathing with wheezing bilaterally suspicious of COPD exacerbation. The cardiac monitor was ordered secondary to the patient's history of shortness of breath and to monitor the patient for dysrhythmia. Cardiac monitor by my independent interpretation showed normal sinus.  Will obtain labs and imaging and give another round  of DuoNebs and steroids patient is already received 2 rounds of DuoNebs.  Patient was unsure whether or not she is having clear or colored sputum however when I was about to leave the room patient thinks that she may have colored sputum so will cover with antibiotics.  Due to her oxygen requirement do feel patient will need admission.  Upon chart review it appears patient is no longer on Eliquis for her provoked DVT and with her wheezing and having sick contacts at home with a nonproductive cough do feel that this is more consistent with COPD exacerbation versus PE.  Patient's labs and imaging were ultimately reassuring and at this time we will admit to hospitalist for COPD exacerbation from suspected viral illness.  I spoke to the hospitalist and patient was accepted for admission.  Patient stable to be admitted.  This chart was dictated using voice recognition software.  Despite best efforts to proofread,  errors can occur which can change the documentation  meaning.         Final Clinical Impression(s) / ED Diagnoses Final diagnoses:  COPD exacerbation (HCC)  Acute on chronic respiratory failure with hypoxia [facility])    Rx / DC Orders ED Discharge Orders     None         Remi Deter 08/20/23 2059    Benjiman Core, MD 08/20/23 [phone]

## 2023-08-20 NOTE — ED Triage Notes (Signed)
BIBA from home for [facility] x 2 days. Hx of COPD, 4 lpm at all times. Exp wheezing all over. 2 Duonebs given PTA. 86% room air, 92% @ 4 lpm 136/68 BP 70 HR 24 RR

## 2023-08-20 NOTE — H&P (Signed)
History and Physical    Yvette Booth WUJ:[id number] DOB: 1960-08-08 DOA: 08/20/2023  PCP: [facility]   Patient coming from: Home   Chief Complaint:  Chief Complaint  Patient presents with   Shortness of Breath    HPI: History limited patient poor historian.  Obtained by patient and chart review Yvette Booth is a 63 y.o. 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  DVT, hypertension, hyperthyroidism, OSA, who presented due to acute onset of dyspnea.  Reports that she woke up from a nap felt hot and difficulty breathing.  On EMS arrival was 86% on room air requiring nonrebreather initially.   History is different from ED providers on my interview denies recent cough.  Per ED reporting cough and sick contacts.  Otherwise denying fevers, chills, URI symptoms, chest pain, lower extremity swelling, or any other recent illness.   Review of Systems:  ROS complete and negative except as marked above   No Known Allergies  Prior to Admission medications   Not on File  Per 2/11 Pulmonology note:   Meds Ordered in [place]  Medication Sig Dispense Refill  albuterol HFA (PROVENTIL HFA;VENTOLIN HFA;PROAIR HFA) 90 mcg/actuation inhaler Inhale 1 puff every 4 (four) hours as needed for wheezing. 8.5 g 11  atorvastatin (LIPITOR) 10 mg tablet Take 10 mg by mouth Once Daily for 30 days. 30 tablet 0  diabetic supplies, miscellan. misc Glucometer - Preferred brand, Daily, Check blood sugar before meals and at bedtime, ICD-10 Code: Type 2 - E11.9 1 each 0  fluticasone propion-salmeteroL (ADVAIR DISKUS) 250-50 mcg/dose diskus inhaler Inhale 1 puff in the morning and 1 puff before bedtime. 180 each 0  furosemide (LASIX) 40 mg tablet Take 1 tablet (40 mg total) by mouth in the morning. 90 tablet 0  glucose blood test  strip Use as directed. 20 each 2  methIMAzole (TAPAZOLE) 10 mg tablet Take 1 tablet (10 mg total) by mouth 2 (two) times a day. 60 tablet 0  metoprolol succinate (TOPROL XL) 50 mg 24 hr tablet Take 1 tablet (50 mg total) by mouth daily. 90 tablet 0  montelukast (SINGULAIR) 10 mg tablet Take 10 mg by mouth Once Daily for 30 days. 30 tablet 0  nicotine (NICODERM CQ) 21 mg/24 hr patch Place 1 patch on the skin daily.  umeclidinium (Incruse Ellipta) 62.5 mcg/actuation dsdv Inhale 1 puff once daily. 90 each 0   On my interview also reports taking Apixaban 5 mg BID   Past Medical History:  Diagnosis Date   A-fib (HCC)    Chronic hypercapnic respiratory failure (HCC)    BIPAP at night   Chronic hypoxic respiratory failure (HCC)    COPD (chronic obstructive pulmonary disease) (HCC)    DVT (deep venous thrombosis) (HCC)    Hypertension    Hyperthyroidism    Ischemic stroke (HCC)    OSA (obstructive sleep apnea)    RVF (right ventricular failure) (HCC)     History reviewed. No pertinent surgical history.   reports that she has been smoking cigarettes. She does not have any smokeless tobacco history on file. She reports that she does not currently use alcohol. She reports that she does not use drugs.  History reviewed. No pertinent family history.   Physical Exam: Vitals:   08/20/23 2000 08/20/23 2030 08/20/23 2100 08/20/23 2200  BP: (!) 165/78 (!) 145/123 (!) 143/82 (!) 133/109  Pulse: 96 (!) 104 (!) 102 100  Resp: (!) 30 (!) 32 (!) 28 (!) 32  Temp:      TempSrc:      SpO2: 99% 95% 95% 99%    Gen: Awake, alert, NAD   CV: Regular, normal S1, S2, no murmurs  Resp: Increased WOB, tachypneic, on 3L O2, Diffuse expiratory wheeze.   Abd: Flat, normoactive, nontender MSK: Symmetric, no edema  Skin: No rashes or lesions to exposed skin  Neuro: Alert and interactive  Psych: euthymic, appropriate    Data review:   Labs reviewed, notable for:   Bicarb 42 Other chemistries and  blood counts unremarkable  Micro:  Results for orders placed or performed during the hospital encounter of 08/20/23  Resp panel by RT-PCR (RSV, Flu A&B, Covid) Anterior Nasal Swab     Status: None   Collection Time: 08/20/23  7:25 PM   Specimen: Anterior Nasal Swab  Result Value Ref Range Status   SARS Coronavirus 2 by RT PCR NEGATIVE NEGATIVE Final    Comment: (NOTE) SARS-CoV-2 target nucleic acids are NOT DETECTED.  The SARS-CoV-2 RNA is generally detectable in upper respiratory specimens during the acute phase of infection. The lowest concentration of SARS-CoV-2 viral copies this assay can detect is 138 copies/mL. A negative result does not preclude SARS-Cov-2 infection and should not be used as the sole basis for treatment or other patient management decisions. A negative result may occur with  improper specimen collection/handling, submission of specimen other than nasopharyngeal swab, presence of viral mutation(s) within the areas targeted by this assay, and inadequate number of viral copies(<138 copies/mL). A negative result must be combined with clinical observations, patient history, and epidemiological information. The expected result is Negative.  Fact Sheet for Patients:  BloggerCourse.com  Fact Sheet for Healthcare Providers:  SeriousBroker.it  This test is no t yet approved or cleared by the [place] FDA and  has been authorized for detection and/or diagnosis of SARS-CoV-2 by FDA under an Emergency Use Authorization (EUA). This EUA will remain  in effect (meaning this test can be used) for the duration of the COVID-19 declaration under Section 564(b)(1) of the Act, 21 U.S.C.section 360bbb-3(b)(1), unless the authorization is terminated  or revoked sooner.       Influenza A by PCR NEGATIVE NEGATIVE Final   Influenza B by PCR NEGATIVE NEGATIVE Final    Comment: (NOTE) The Xpert Xpress SARS-CoV-2/FLU/RSV plus  assay is intended as an aid in the diagnosis of influenza from Nasopharyngeal swab specimens and should not be used as a sole basis for treatment. Nasal washings and aspirates are unacceptable for Xpert Xpress SARS-CoV-2/FLU/RSV testing.  Fact Sheet for Patients: BloggerCourse.com  Fact Sheet for Healthcare Providers: SeriousBroker.it  This test is not yet approved or cleared by the [place] FDA and has been authorized for detection and/or diagnosis of SARS-CoV-2 by FDA under an Emergency Use Authorization (EUA). This EUA will remain in effect (meaning this test can be used) for the duration of the COVID-19 declaration under Section 564(b)(1) of the Act, 21 U.S.C. section 360bbb-3(b)(1), unless the authorization is terminated or revoked.     Resp Syncytial Virus by PCR NEGATIVE NEGATIVE Final    Comment: (NOTE) Fact Sheet for Patients: BloggerCourse.com  Fact Sheet for Healthcare Providers: SeriousBroker.it  This test is not yet approved or cleared by the [place] FDA and has been authorized for detection and/or diagnosis of SARS-CoV-2 by FDA  under an Emergency Use Authorization (EUA). This EUA will remain in effect (meaning this test can be used) for the duration of the COVID-19 declaration under Section 564(b)(1) of the Act, 21 U.S.C. section 360bbb-3(b)(1), unless the authorization is terminated or revoked.  Performed at [facility], [address] [place], [place] [address]     Imaging reviewed:  DG Chest Portable 1 View Result Date: 08/20/2023 CLINICAL DATA:  Dyspnea EXAM: PORTABLE CHEST 1 VIEW COMPARISON:  04/23/2023 FINDINGS: Lungs are symmetrically well inflated. Paucity of vasculature in the lung apices is in keeping with underlying emphysema. No confluent pulmonary infiltrate. No pneumothorax or pleural effusion. Cardiac size within  normal limits. Pulmonary vascularity is otherwise normal. No acute bone abnormality. IMPRESSION: 1. Emphysema. No active disease. Electronically Signed   By: Helyn Numbers M.D.   On: 08/20/2023 20:46    EKG:  Personally reviewed sinus rhythm with PVCs, RAD, no acute ischemic changes.  ED Course:  Treated with methylprednisolone 125 mg x 1, ceftriaxone, azithromycin, nebulizers.   Assessment/Plan:  63 y.o. 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  DVT, hypertension, hyperthyroidism, OSA, who presented due to acute onset of dyspnea. Admitted for COPD exacerbation and acute on chronic hypoxic respiratory failure   COPD exacerbation, severe Acute on chronic hypoxic respiratory failure Chronic hypercapnic respiratory failure Acute dyspnea unclear hx of cough, . Hypoxic to 86% on RA on EMS requiring NRB -> able to be weaned to 3L at time of admission. On initial ED evaluation tachypneic in the low 30s, tachycardic. Exam with diffuse wheezing, poor air movement. Mostly compensated hypercarbic failure with 7.29 / 101, bicarb 42. Flu / COVID / RSV neg. CXR with no infiltrate. Overall consistent with COPD exacerbation. -Placed on BiPAP overnight with her hypercarbia and repeat VBG in the morning; to continue BiPAP nightly in the hospital, ordered. -S/p methylprednisolone 125 mg x 1 in the ED, continue 60 mg IV every 12 hours for now -Switch antibiotics to levofloxacin 500 mg in the evening tomorrow -Check RVP, sputum culture - Home Advair/Incruse Ellipta equivalent, DuoNebs every 6 hours scheduled, albuterol every 4 hours as needed, incentive spirometer, flutter valve, encourage out of bed to chair. Continue home singulair  -Home O2 evaluation prior to discharge -Consider referral to pulmonary rehab -Smoking cessation per below  Smoking  cessation 150-pack-year smoker, currently smoking 1 pack/day.  She is contemplative about cutting down on smoking but does not think she will be able to accomplish this due to her physical dependency and lifelong smoking.  Counseled on smoking cessation - Nicotine patch 14 mg plus nicotine gum, would Rx both at time of discharge.  Chronic medical problems: Medication management: Medication management: Pharm med history not completed at time of admission, med rec completed based on chart review and discussion with patient.  Attention to pharmacy med rec: Completed History of RV failure: Per chart review; she is not sure if still taking lasix 40 mg daily held for now  History of CVA: On DOAC monotherapy, atorvastatin 10 mg A-fib, paroxysmal: Reports on anticoagulation, continue apixaban 5 mg twice daily, continue home Metoprolol 50 mg daily.  ?  DVT: Not on anticoagulation per above Hypertension: Not on home antihypertensives Hyperthyroidism: Reports on methimazole 10 mg twice daily, continue OSA: nightly bipap per above   There is no height or weight on  file to calculate BMI.    DVT prophylaxis:  Eliquis Code Status:  Full Code Diet:  Diet Orders (From admission, onward)     Start     Ordered   08/20/23 2112  Diet Heart Room service appropriate? Yes; Fluid consistency: Thin  Diet effective now       Question Answer Comment  Room service appropriate? Yes   Fluid consistency: Thin      08/20/23 2115           Family Communication:  No   Consults:  None   Admission status:   Inpatient, Step Down Unit  Severity of Illness: The appropriate patient status for this patient is INPATIENT. Inpatient status is judged to be reasonable and necessary in order to provide the required intensity of service to ensure the patient's safety. The patient's presenting symptoms, physical exam findings, and initial radiographic and laboratory data in the context of their chronic comorbidities is felt to  place them at high risk for further clinical deterioration. Furthermore, it is not anticipated that the patient will be medically stable for discharge from the hospital within 2 midnights of admission.   * I certify that at the point of admission it is my clinical judgment that the patient will require inpatient hospital care spanning beyond 2 midnights from the point of admission due to high intensity of service, high risk for further deterioration and high frequency of surveillance required.*   Dolly Rias, MD [facility]  How to contact the [facility] Attending or Consulting provider 7A - 7P or covering provider during after hours 7P -7A, for this patient.  Check the care team in [facility] and look for a) attending/consulting [facility] provider listed and b) the [facility] team listed Log into [email or web address] and use [place]'s universal password to access. If you do not have the password, please contact the hospital operator. Locate the [facility] provider you are looking for under [facility] and page to a number that you can be directly reached. If you still have difficulty reaching the provider, please page the [facility] (Director on Call) for the Hospitalists listed on amion for assistance.  08/20/2023, 10:14 PM

## 2023-08-20 NOTE — ED Notes (Signed)
Patient to be placed on BiPAP around 1130pm. She is finished eating.

## 2023-08-20 NOTE — ED Notes (Signed)
Patient was given sandwich and drink. Sitting up in bed eating at this time.

## 2023-08-20 NOTE — ED Notes (Signed)
Placed patient on her normal 3L [place] at this time.

## 2023-08-20 NOTE — Progress Notes (Signed)
Pt states she told the RN that she would like another cup of ginger ale before she would go on bipap. Bipap standby will wait when pt ready.

## 2023-08-21 DIAGNOSIS — R739 Hyperglycemia, unspecified: Secondary | ICD-10-CM | POA: Insufficient documentation

## 2023-08-21 DIAGNOSIS — Z716 Tobacco abuse counseling: Secondary | ICD-10-CM | POA: Diagnosis not present

## 2023-08-21 DIAGNOSIS — J9621 Acute and chronic respiratory failure with hypoxia: Secondary | ICD-10-CM | POA: Diagnosis not present

## 2023-08-21 DIAGNOSIS — J441 Chronic obstructive pulmonary disease with (acute) exacerbation: Secondary | ICD-10-CM

## 2023-08-21 DIAGNOSIS — Z8673 Personal history of transient ischemic attack (TIA), and cerebral infarction without residual deficits: Secondary | ICD-10-CM | POA: Diagnosis not present

## 2023-08-21 DIAGNOSIS — E0590 Thyrotoxicosis, unspecified without thyrotoxic crisis or storm: Secondary | ICD-10-CM | POA: Insufficient documentation

## 2023-08-21 DIAGNOSIS — J9622 Acute and chronic respiratory failure with hypercapnia: Secondary | ICD-10-CM

## 2023-08-21 DIAGNOSIS — I480 Paroxysmal atrial fibrillation: Secondary | ICD-10-CM | POA: Insufficient documentation

## 2023-08-21 DIAGNOSIS — G4733 Obstructive sleep apnea (adult) (pediatric): Secondary | ICD-10-CM | POA: Insufficient documentation

## 2023-08-21 LAB — BASIC METABOLIC PANEL
Anion gap: 10 (ref 5–15)
BUN: 22 mg/dL (ref 8–23)
CO2: 39 mmol/L — ABNORMAL HIGH (ref 22–32)
Calcium: 9.3 mg/dL (ref 8.9–10.3)
Chloride: 92 mmol/L — ABNORMAL LOW (ref 98–111)
Creatinine, Ser: 0.71 mg/dL (ref 0.44–1.00)
GFR, Estimated: >60 mL/min (ref >60)
Glucose, Bld: 309 mg/dL — ABNORMAL HIGH (ref 70–99)
Potassium: 4.2 mmol/L (ref 3.5–5.1)
Sodium: 141 mmol/L (ref 135–145)

## 2023-08-21 LAB — GLUCOSE, CAPILLARY
Glucose-Capillary: 169 mg/dL — ABNORMAL HIGH (ref 70–99)
Glucose-Capillary: 145 mg/dL — ABNORMAL HIGH (ref 70–99)
Glucose-Capillary: 163 mg/dL — ABNORMAL HIGH (ref 70–99)
Glucose-Capillary: 122 mg/dL — ABNORMAL HIGH (ref 70–99)

## 2023-08-21 LAB — CBC
HCT: 44.4 % (ref 36.0–46.0)
Hemoglobin: 13.2 g/dL (ref 12.0–15.0)
MCH: 28.6 pg (ref 26.0–34.0)
MCHC: 29.7 g/dL — ABNORMAL LOW (ref 30.0–36.0)
MCV: 96.1 fL (ref 80.0–100.0)
Platelets: 201 10*3/uL (ref 150–400)
RBC: 4.62 MIL/uL (ref 3.87–5.11)
RDW: 13.1 % (ref 11.5–15.5)
WBC: 6.8 10*3/uL (ref 4.0–10.5)
nRBC: 0 % (ref 0.0–0.2)

## 2023-08-21 LAB — RESPIRATORY PANEL BY PCR

## 2023-08-21 LAB — MRSA NEXT GEN BY PCR, NASAL: MRSA by PCR Next Gen: DETECTED — AB

## 2023-08-21 LAB — BLOOD GAS, VENOUS
Acid-Base Excess: 17.7 mmol/L — ABNORMAL HIGH (ref 0.0–2.0)
Bicarbonate: 48 mmol/L — ABNORMAL HIGH (ref 20.0–28.0)
O2 Saturation: 94.3 %
Patient temperature: 36.5
pCO2, Ven: 85 mm[Hg] (ref 44–60)
pH, Ven: 7.36 (ref 7.25–7.43)
pO2, Ven: 55 mm[Hg] — ABNORMAL HIGH (ref 32–45)

## 2023-08-21 LAB — MAGNESIUM: Magnesium: 2.3 mg/dL (ref 1.7–2.4)

## 2023-08-21 LAB — HEMOGLOBIN A1C
Hgb A1c MFr Bld: 5.1 % (ref 4.8–5.6)
Mean Plasma Glucose: 99.67 mg/dL

## 2023-08-21 LAB — BRAIN NATRIURETIC PEPTIDE: B Natriuretic Peptide: 37.7 pg/mL (ref 0.0–100.0)

## 2023-08-21 LAB — PHOSPHORUS: Phosphorus: 2.8 mg/dL (ref 2.5–4.6)

## 2023-08-21 MED ORDER — INSULIN ASPART 100 UNIT/ML IJ SOLN: 0.0000 [IU] | Freq: Every day | INTRAMUSCULAR | Status: DC

## 2023-08-21 MED ORDER — INSULIN ASPART 100 UNIT/ML IJ SOLN
0.0000 [IU] | Freq: Three times a day (TID) | INTRAMUSCULAR | Status: DC
  Administered 2023-08-21: 3 [IU] via SUBCUTANEOUS
  Administered 2023-08-21: 2 [IU] via SUBCUTANEOUS
  Administered 2023-08-21: 3 [IU] via SUBCUTANEOUS
  Administered 2023-08-22: 2 [IU] via SUBCUTANEOUS
  Administered 2023-08-22 – 2023-08-23 (×2): 3 [IU] via SUBCUTANEOUS

## 2023-08-21 MED ORDER — ORAL CARE MOUTH RINSE: 15.0000 mL | OROMUCOSAL | Status: DC | PRN

## 2023-08-21 MED ORDER — CHLORHEXIDINE GLUCONATE CLOTH 2 % EX PADS
6.0000 | MEDICATED_PAD | Freq: Every day | CUTANEOUS | Status: DC
  Administered 2023-08-21 – 2023-08-23 (×3): 6 via TOPICAL

## 2023-08-21 MED ORDER — ORAL CARE MOUTH RINSE
15.0000 mL | OROMUCOSAL | Status: DC
  Administered 2023-08-21 – 2023-08-23 (×6): 15 mL via OROMUCOSAL

## 2023-08-21 NOTE — Assessment & Plan Note (Signed)
-  Continue methimazole ?

## 2023-08-21 NOTE — Assessment & Plan Note (Signed)
-   ongoing tobacco use; approx 150 pack year history per H&P - continue nicotine patch

## 2023-08-21 NOTE — Assessment & Plan Note (Signed)
-   On 2 to 4 L oxygen at home - down to ~4 L this morning

## 2023-08-21 NOTE — ED Notes (Signed)
 ED TO INPATIENT HANDOFF REPORT  ED Nurse Name and Phone #: Jacqulyn Liner EMTP   S Name/Age/Gender Yvette Booth 63 y.o. female Room/Bed: WA02/WA02  Code Status   Code Status: Full Code  Home/SNF/Other Home Patient oriented to: self, place, time, and situation Is this baseline? Yes   Triage Complete: Triage complete  Chief Complaint COPD exacerbation (HCC) [J44.1]  Triage Note BIBA from home for [facility] x 2 days. Hx of COPD, 4 lpm at all times. Exp wheezing all over. 2 Duonebs given PTA. 86% room air, 92% @ 4 lpm 136/68 BP 70 HR 24 RR    Allergies No Known Allergies  Level of Care/Admitting Diagnosis ED Disposition     ED Disposition  Admit   Condition  --   Comment  Hospital Area: [facility] [[id number]]  Level of Care: Stepdown [14]  Admit to SDU based on following criteria: Respiratory Distress:  Frequent assessment and/or intervention to maintain adequate ventilation/respiration, pulmonary toilet, and respiratory treatment.  May admit patient to [facility] or [facility] if equivalent level of care is available:: No  Covid Evaluation: Asymptomatic - no recent exposure (last 10 days) testing not required  Diagnosis: COPD exacerbation [facility]) [[id number]]  Admitting Physician: Dolly Rias [[id number]]  Attending Physician: Dolly Rias [[id number]]  Certification:: I certify this patient will need inpatient services for at least 2 midnights  Expected Medical Readiness: 08/23/2023          B Medical/Surgery History Past Medical History:  Diagnosis Date   A-fib (HCC)    Chronic hypercapnic respiratory failure (HCC)    BIPAP at night   Chronic hypoxic respiratory failure (HCC)    COPD (chronic obstructive pulmonary disease) (HCC)    DVT (deep venous thrombosis) (HCC)    Hypertension    Hyperthyroidism    Ischemic stroke (HCC)    OSA (obstructive sleep apnea)    RVF (right ventricular failure) (HCC)    History reviewed. No pertinent  surgical history.   A IV Location/Drains/Wounds Patient Lines/Drains/Airways Status     Active Line/Drains/Airways     Name Placement date Placement time Site Days   Peripheral IV 08/20/23 20 G 1" Right Antecubital 08/20/23  1931  Antecubital  1            Intake/Output Last 24 hours No intake or output data in the 24 hours ending 08/21/23 0113  Labs/Imaging Results for orders placed or performed during the hospital encounter of 08/20/23 (from the past 48 hours)  Resp panel by RT-PCR (RSV, Flu A&B, Covid) Anterior Nasal Swab     Status: None   Collection Time: 08/20/23  7:25 PM   Specimen: Anterior Nasal Swab  Result Value Ref Range   SARS Coronavirus 2 by RT PCR NEGATIVE NEGATIVE    Comment: (NOTE) SARS-CoV-2 target nucleic acids are NOT DETECTED.  The SARS-CoV-2 RNA is generally detectable in upper respiratory specimens during the acute phase of infection. The lowest concentration of SARS-CoV-2 viral copies this assay can detect is 138 copies/mL. A negative result does not preclude SARS-Cov-2 infection and should not be used as the sole basis for treatment or other patient management decisions. A negative result may occur with  improper specimen collection/handling, submission of specimen other than nasopharyngeal swab, presence of viral mutation(s) within the areas targeted by this assay, and inadequate number of viral copies(<138 copies/mL). A negative result must be combined with clinical observations, patient history, and epidemiological information. The expected result is Negative.  Fact Sheet for  Patients:  BloggerCourse.com  Fact Sheet for Healthcare Providers:  SeriousBroker.it  This test is no t yet approved or cleared by the [place] FDA and  has been authorized for detection and/or diagnosis of SARS-CoV-2 by FDA under an Emergency Use Authorization (EUA). This EUA will remain  in effect (meaning  this test can be used) for the duration of the COVID-19 declaration under Section 564(b)(1) of the Act, 21 U.S.C.section 360bbb-3(b)(1), unless the authorization is terminated  or revoked sooner.       Influenza A by PCR NEGATIVE NEGATIVE   Influenza B by PCR NEGATIVE NEGATIVE    Comment: (NOTE) The Xpert Xpress SARS-CoV-2/FLU/RSV plus assay is intended as an aid in the diagnosis of influenza from Nasopharyngeal swab specimens and should not be used as a sole basis for treatment. Nasal washings and aspirates are unacceptable for Xpert Xpress SARS-CoV-2/FLU/RSV testing.  Fact Sheet for Patients: BloggerCourse.com  Fact Sheet for Healthcare Providers: SeriousBroker.it  This test is not yet approved or cleared by the [place] FDA and has been authorized for detection and/or diagnosis of SARS-CoV-2 by FDA under an Emergency Use Authorization (EUA). This EUA will remain in effect (meaning this test can be used) for the duration of the COVID-19 declaration under Section 564(b)(1) of the Act, 21 U.S.C. section 360bbb-3(b)(1), unless the authorization is terminated or revoked.     Resp Syncytial Virus by PCR NEGATIVE NEGATIVE    Comment: (NOTE) Fact Sheet for Patients: BloggerCourse.com  Fact Sheet for Healthcare Providers: SeriousBroker.it  This test is not yet approved or cleared by the [place] FDA and has been authorized for detection and/or diagnosis of SARS-CoV-2 by FDA under an Emergency Use Authorization (EUA). This EUA will remain in effect (meaning this test can be used) for the duration of the COVID-19 declaration under Section 564(b)(1) of the Act, 21 U.S.C. section 360bbb-3(b)(1), unless the authorization is terminated or revoked.  Performed at [facility], [address] [place], [place] [address]   Comprehensive metabolic panel      Status: Abnormal   Collection Time: 08/20/23  7:37 PM  Result Value Ref Range   Sodium 141 135 - 145 mmol/L   Potassium 4.4 3.5 - 5.1 mmol/L   Chloride 90 (L) 98 - 111 mmol/L   CO2 42 (H) 22 - 32 mmol/L   Glucose, Bld 135 (H) 70 - 99 mg/dL    Comment: Glucose reference range applies only to samples taken after fasting for at least 8 hours.   BUN 15 8 - 23 mg/dL   Creatinine, Ser 6.04 0.44 - 1.00 mg/dL   Calcium 9.0 8.9 - 54.0 mg/dL   Total Protein 6.9 6.5 - 8.1 g/dL   Albumin 3.7 3.5 - 5.0 g/dL   AST 11 (L) 15 - 41 U/L   ALT 9 0 - 44 U/L   Alkaline Phosphatase 67 38 - 126 U/L   Total Bilirubin 0.5 0.0 - 1.2 mg/dL   GFR, Estimated >98 >11 mL/min    Comment: (NOTE) Calculated using the CKD-EPI Creatinine Equation (2021)    Anion gap 9 5 - 15    Comment: Performed at [facility], [address], [place], [place] [address]  CBC with Differential     Status: Abnormal   Collection Time: 08/20/23  7:37 PM  Result Value Ref Range   WBC 6.6 4.0 - 10.5 K/uL   RBC 5.13 (H) 3.87 - 5.11 MIL/uL   Hemoglobin 15.0 12.0 - 15.0 g/dL  HCT 49.3 (H) 36.0 - 46.0 %   MCV 96.1 80.0 - 100.0 fL   MCH 29.2 26.0 - 34.0 pg   MCHC 30.4 30.0 - 36.0 g/dL   RDW 16.1 09.6 - 04.5 %   Platelets 229 150 - 400 K/uL   nRBC 0.0 0.0 - 0.2 %   Neutrophils Relative % 72 %   Neutro Abs 4.7 1.7 - 7.7 K/uL   Lymphocytes Relative 21 %   Lymphs Abs 1.4 0.7 - 4.0 K/uL   Monocytes Relative 6 %   Monocytes Absolute 0.4 0.1 - 1.0 K/uL   Eosinophils Relative 1 %   Eosinophils Absolute 0.1 0.0 - 0.5 K/uL   Basophils Relative 0 %   Basophils Absolute 0.0 0.0 - 0.1 K/uL   Immature Granulocytes 0 %   Abs Immature Granulocytes 0.02 0.00 - 0.07 K/uL    Comment: Performed at [facility], [address], [place], [place] [address]  Troponin I (High Sensitivity)     Status: None   Collection Time: 08/20/23  7:37 PM  Result Value Ref Range   Troponin I (High Sensitivity) 3 <18 ng/L     Comment: (NOTE) Elevated high sensitivity troponin I (hsTnI) values and significant  changes across serial measurements may suggest ACS but many other  chronic and acute conditions are known to elevate hsTnI results.  Refer to the "Links" section for chest pain algorithms and additional  guidance. Performed at [facility], [address] [place], [place] [address]   Troponin I (High Sensitivity)     Status: None   Collection Time: 08/20/23  9:52 PM  Result Value Ref Range   Troponin I (High Sensitivity) 3 <18 ng/L    Comment: (NOTE) Elevated high sensitivity troponin I (hsTnI) values and significant  changes across serial measurements may suggest ACS but many other  chronic and acute conditions are known to elevate hsTnI results.  Refer to the "Links" section for chest pain algorithms and additional  guidance. Performed at [facility], [address] [place], [place] [address]   Blood gas, venous     Status: Abnormal   Collection Time: 08/20/23  9:52 PM  Result Value Ref Range   pH, Ven 7.29 7.25 - 7.43   pCO2, Ven 101 (HH) 44 - 60 mmHg    Comment: CRITICAL RESULT CALLED TO, READ BACK BY AND VERIFIED WITH: Tyger Oka,C AT 2222 ON 08/20/2023 BY LUZOLOP    pO2, Ven 32 32 - 45 mmHg   Bicarbonate 48.6 (H) 20.0 - 28.0 mmol/L   Acid-Base Excess 16.9 (H) 0.0 - 2.0 mmol/L   O2 Saturation 68.4 %   Patient temperature 37.0     Comment: Performed at [facility], [address], [place], [place] [address]   DG Chest Portable 1 View Result Date: 08/20/2023 CLINICAL DATA:  Dyspnea EXAM: PORTABLE CHEST 1 VIEW COMPARISON:  04/23/2023 FINDINGS: Lungs are symmetrically well inflated. Paucity of vasculature in the lung apices is in keeping with underlying emphysema. No confluent pulmonary infiltrate. No pneumothorax or pleural effusion. Cardiac size within normal limits. Pulmonary vascularity is otherwise normal. No acute bone abnormality.  IMPRESSION: 1. Emphysema. No active disease. Electronically Signed   By: Helyn Numbers M.D.   On: 08/20/2023 20:46    Pending Labs Unresulted Labs (From admission, onward)     Start     Ordered   08/21/23 0500  HIV Antibody (routine testing w rflx)  (HIV Antibody (Routine testing  w reflex) panel)  Tomorrow morning,   R        08/20/23 2115   08/21/23 0500  Basic metabolic panel  Tomorrow morning,   R        08/20/23 2115   08/21/23 0500  CBC  Tomorrow morning,   R        08/20/23 2115   08/21/23 0500  Magnesium  Tomorrow morning,   R        08/20/23 2115   08/21/23 0500  Phosphorus  Tomorrow morning,   R        08/20/23 2115   08/21/23 0500  Brain natriuretic peptide  Tomorrow morning,   R        08/20/23 2115   08/21/23 0500  Blood gas, venous  Tomorrow morning,   R        08/20/23 2224   08/20/23 2114  Expectorated Sputum Assessment w Gram Stain, Rflx to Resp Cult  Once,   R        08/20/23 2115   08/20/23 2114  Respiratory (~20 pathogens) panel by PCR  (Respiratory panel by PCR (~20 pathogens, ~24 hr TAT)  w precautions)  Once,   R        08/20/23 2115            Vitals/Pain Today's Vitals   08/20/23 2200 08/20/23 2230 08/20/23 2306 08/21/23 0015  BP: (!) 133/109 103/64    Pulse: 100 (!) 118  (!) 110  Resp: (!) 32 (!) 35  (!) 27  Temp:   98.2 F (36.8 C)   TempSrc:   Oral   SpO2: 99% 92%  97%    Isolation Precautions Droplet precaution  Medications Medications  sodium chloride flush (NS) 0.9 % injection 3 mL (3 mLs Intravenous Given 08/20/23 2147)  acetaminophen (TYLENOL) tablet 1,000 mg (has no administration in time range)  melatonin tablet 6 mg (has no administration in time range)  polyethylene glycol (MIRALAX / GLYCOLAX) packet 17 g (has no administration in time range)  ondansetron (ZOFRAN) injection 4 mg (has no administration in time range)  ipratropium-albuterol (DUONEB) 0.5-2.5 (3) MG/3ML nebulizer solution 3 mL (3 mLs Nebulization Given 08/20/23  2152)  albuterol (PROVENTIL) (2.5 MG/3ML) 0.083% nebulizer solution 2.5 mg (has no administration in time range)  methylPREDNISolone sodium succinate (SOLU-MEDROL) 125 mg/2 mL injection 60 mg (has no administration in time range)  levofloxacin (LEVAQUIN) tablet 500 mg (has no administration in time range)  apixaban (ELIQUIS) tablet 5 mg (has no administration in time range)  fluticasone furoate-vilanterol (BREO ELLIPTA) 200-25 MCG/ACT 1 puff (has no administration in time range)  umeclidinium bromide (INCRUSE ELLIPTA) 62.5 MCG/ACT 1 puff (has no administration in time range)  atorvastatin (LIPITOR) tablet 10 mg (has no administration in time range)  methimazole (TAPAZOLE) tablet 10 mg (has no administration in time range)  metoprolol succinate (TOPROL-XL) 24 hr tablet 50 mg (has no administration in time range)  montelukast (SINGULAIR) tablet 10 mg (has no administration in time range)  nicotine (NICODERM CQ - dosed in mg/24 hours) patch 14 mg (has no administration in time range)  nicotine polacrilex (NICORETTE) gum 2 mg (has no administration in time range)  methylPREDNISolone sodium succinate (SOLU-MEDROL) 125 mg/2 mL injection 125 mg (125 mg Intravenous Given 08/20/23 1947)  ipratropium-albuterol (DUONEB) 0.5-2.5 (3) MG/3ML nebulizer solution 3 mL (3 mLs Nebulization Given 08/20/23 1947)  cefTRIAXone (ROCEPHIN) 1 g in sodium chloride 0.9 % 100 mL IVPB (0 g Intravenous Stopped 08/20/23 2023)  azithromycin (ZITHROMAX) 500 mg in sodium chloride 0.9 % 250 mL IVPB (0 mg Intravenous Stopped 08/20/23 2130)    Mobility walks     Focused Assessments Pulmonary Assessment Handoff:  Lung sounds: Bilateral Breath Sounds: Diminished L Breath Sounds: Expiratory wheezes R Breath Sounds: Expiratory wheezes        R Recommendations: See Admitting Provider Note  Report given to:   Additional Notes: Patient currently on BiPAP, will repeat VBG in am to see if has improvement.

## 2023-08-21 NOTE — Progress Notes (Addendum)
 Progress Note    Yvette Booth   ION:[id number]  DOB: 1961/06/27  DOA: 08/20/2023     1 PCP: [facility]  Initial CC: hypoxic  Hospital Course: Yvette Booth is a 63 year old female with PMH COPD, chronic hypoxia on 2 to 4 L oxygen at home, nightly BiPAP, ongoing tobacco use, PAF, HTN, hyperthyroidism, OSA who presented with worsening dyspnea.  She was found to be hypoxic on room air with EMS and brought to the hospital. Respiratory swabs were all negative including RVP, COVID, flu, RSV.  CXR was compatible with underlying emphysema but no other acute findings. Initial VBG was consistent with hypercarbic respiratory failure and she was placed on BiPAP on admission and treated for COPD exac.   Interval History:  Resting comfortably with BiPAP in place when seen this morning.  Speaking full sentences easily.  Awake and alert and following commands easily.  Assessment and Plan: * COPD exacerbation (HCC) - No obvious etiology aside from ongoing tobacco use - COVID, flu, RSV, RVP negative - CXR unremarkable  -Continue nebulizers and steroids - Continue BiPAP as needed and at night  Acute on chronic respiratory failure with hypoxia and hypercapnia (HCC) - On 2 to 4 L oxygen at home - Currently on intermittent BiPAP.  Transition back to home regimen as able  Hyperglycemia - Follow-up A1c - Continue SSI and CBG monitoring for now  History of CVA (cerebrovascular accident) - per MRI brain 01/08/23: "Remote bilateral occipital infarcts. Small remote left thalamic lacunar infarct. Additional scattered T2/FLAIR hyperintensities the white matter, compatible with chronic  microvascular ischemic disease." - continue Eliquis and Lipitor  OSA (obstructive sleep apnea) - On nightly BiPAP at home  PAF (paroxysmal atrial fibrillation) (HCC) - Continue Eliquis and Toprol  Hyperthyroidism - Continue methimazole  Encounter for smoking cessation counseling - ongoing  tobacco use; approx 150 pack year history per H&P - continue nicotine patch   Old records reviewed in assessment of this patient  Antimicrobials: Azithromycin and Rocephin 08/20/2023 x 1 Levaquin 08/21/2023 >> current  DVT prophylaxis:   apixaban (ELIQUIS) tablet 5 mg   Code Status:   Code Status: Full Code  Mobility Assessment (Last 72 Hours)     Mobility Assessment   No documentation.           Barriers to discharge: none Disposition Plan:  Home HH orders placed: n/a Status is: Inpt  Objective: Blood pressure (!) 156/75, pulse 92, temperature 98.2 F (36.8 C), temperature source Oral, resp. rate (!) 22, weight 74.4 kg, SpO2 91%.  Examination:  Physical Exam Constitutional:      Appearance: Normal appearance.  HENT:     Head: Normocephalic and atraumatic.     Mouth/Throat:     Mouth: Mucous membranes are moist.  Eyes:     Extraocular Movements: Extraocular movements intact.  Cardiovascular:     Rate and Rhythm: Normal rate and regular rhythm.  Pulmonary:     Effort: Pulmonary effort is normal. No respiratory distress.     Breath sounds: Decreased air movement present. Wheezing present.     Comments: Tight breath sounds Abdominal:     General: Bowel sounds are normal. There is no distension.     Palpations: Abdomen is soft.     Tenderness: There is no abdominal tenderness.  Musculoskeletal:        General: Normal range of motion.     Cervical back: Normal range of motion and neck supple.  Skin:    General:  Skin is warm and dry.  Neurological:     General: No focal deficit present.     Mental Status: She is alert.  Psychiatric:        Mood and Affect: Mood normal.      Consultants:    Procedures:    Data Reviewed: Results for orders placed or performed during the hospital encounter of 08/20/23 (from the past 24 hours)  Resp panel by RT-PCR (RSV, Flu A&B, Covid) Anterior Nasal Swab     Status: None   Collection Time: 08/20/23  7:25 PM    Specimen: Anterior Nasal Swab  Result Value Ref Range   SARS Coronavirus 2 by RT PCR NEGATIVE NEGATIVE   Influenza A by PCR NEGATIVE NEGATIVE   Influenza B by PCR NEGATIVE NEGATIVE   Resp Syncytial Virus by PCR NEGATIVE NEGATIVE  Respiratory (~20 pathogens) panel by PCR     Status: None   Collection Time: 08/20/23  7:25 PM   Specimen: Nasopharyngeal Swab; Respiratory  Result Value Ref Range   Adenovirus NOT DETECTED NOT DETECTED   Coronavirus 229E NOT DETECTED NOT DETECTED   Coronavirus HKU1 NOT DETECTED NOT DETECTED   Coronavirus NL63 NOT DETECTED NOT DETECTED   Coronavirus OC43 NOT DETECTED NOT DETECTED   Metapneumovirus NOT DETECTED NOT DETECTED   Rhinovirus / Enterovirus NOT DETECTED NOT DETECTED   Influenza A NOT DETECTED NOT DETECTED   Influenza B NOT DETECTED NOT DETECTED   Parainfluenza Virus 1 NOT DETECTED NOT DETECTED   Parainfluenza Virus 2 NOT DETECTED NOT DETECTED   Parainfluenza Virus 3 NOT DETECTED NOT DETECTED   Parainfluenza Virus 4 NOT DETECTED NOT DETECTED   Respiratory Syncytial Virus NOT DETECTED NOT DETECTED   Bordetella pertussis NOT DETECTED NOT DETECTED   Bordetella Parapertussis NOT DETECTED NOT DETECTED   Chlamydophila pneumoniae NOT DETECTED NOT DETECTED   Mycoplasma pneumoniae NOT DETECTED NOT DETECTED  Comprehensive metabolic panel     Status: Abnormal   Collection Time: 08/20/23  7:37 PM  Result Value Ref Range   Sodium 141 135 - 145 mmol/L   Potassium 4.4 3.5 - 5.1 mmol/L   Chloride 90 (L) 98 - 111 mmol/L   CO2 42 (H) 22 - 32 mmol/L   Glucose, Bld 135 (H) 70 - 99 mg/dL   BUN 15 8 - 23 mg/dL   Creatinine, Ser 1.61 0.44 - 1.00 mg/dL   Calcium 9.0 8.9 - 09.6 mg/dL   Total Protein 6.9 6.5 - 8.1 g/dL   Albumin 3.7 3.5 - 5.0 g/dL   AST 11 (L) 15 - 41 U/L   ALT 9 0 - 44 U/L   Alkaline Phosphatase 67 38 - 126 U/L   Total Bilirubin 0.5 0.0 - 1.2 mg/dL   GFR, Estimated >04 >54 mL/min   Anion gap 9 5 - 15  CBC with Differential     Status: Abnormal    Collection Time: 08/20/23  7:37 PM  Result Value Ref Range   WBC 6.6 4.0 - 10.5 K/uL   RBC 5.13 (H) 3.87 - 5.11 MIL/uL   Hemoglobin 15.0 12.0 - 15.0 g/dL   HCT 09.8 (H) 11.9 - 14.7 %   MCV 96.1 80.0 - 100.0 fL   MCH 29.2 26.0 - 34.0 pg   MCHC 30.4 30.0 - 36.0 g/dL   RDW 82.9 56.2 - 13.0 %   Platelets 229 150 - 400 K/uL   nRBC 0.0 0.0 - 0.2 %   Neutrophils Relative % 72 %   Neutro Abs  4.7 1.7 - 7.7 K/uL   Lymphocytes Relative 21 %   Lymphs Abs 1.4 0.7 - 4.0 K/uL   Monocytes Relative 6 %   Monocytes Absolute 0.4 0.1 - 1.0 K/uL   Eosinophils Relative 1 %   Eosinophils Absolute 0.1 0.0 - 0.5 K/uL   Basophils Relative 0 %   Basophils Absolute 0.0 0.0 - 0.1 K/uL   Immature Granulocytes 0 %   Abs Immature Granulocytes 0.02 0.00 - 0.07 K/uL  Troponin I (High Sensitivity)     Status: None   Collection Time: 08/20/23  7:37 PM  Result Value Ref Range   Troponin I (High Sensitivity) 3 <18 ng/L  Troponin I (High Sensitivity)     Status: None   Collection Time: 08/20/23  9:52 PM  Result Value Ref Range   Troponin I (High Sensitivity) 3 <18 ng/L  Blood gas, venous     Status: Abnormal   Collection Time: 08/20/23  9:52 PM  Result Value Ref Range   pH, Ven 7.29 7.25 - 7.43   pCO2, Ven 101 (HH) 44 - 60 mmHg   pO2, Ven 32 32 - 45 mmHg   Bicarbonate 48.6 (H) 20.0 - 28.0 mmol/L   Acid-Base Excess 16.9 (H) 0.0 - 2.0 mmol/L   O2 Saturation 68.4 %   Patient temperature 37.0   MRSA Next Gen by PCR, Nasal     Status: Abnormal   Collection Time: 08/21/23  1:49 AM   Specimen: Nasal Mucosa; Nasal Swab  Result Value Ref Range   MRSA by PCR Next Gen DETECTED (A) NOT DETECTED  Basic metabolic panel     Status: Abnormal   Collection Time: 08/21/23  3:17 AM  Result Value Ref Range   Sodium 141 135 - 145 mmol/L   Potassium 4.2 3.5 - 5.1 mmol/L   Chloride 92 (L) 98 - 111 mmol/L   CO2 39 (H) 22 - 32 mmol/L   Glucose, Bld 309 (H) 70 - 99 mg/dL   BUN 22 8 - 23 mg/dL   Creatinine, Ser 1.61 0.44 -  1.00 mg/dL   Calcium 9.3 8.9 - 09.6 mg/dL   GFR, Estimated >04 >54 mL/min   Anion gap 10 5 - 15  CBC     Status: Abnormal   Collection Time: 08/21/23  3:17 AM  Result Value Ref Range   WBC 6.8 4.0 - 10.5 K/uL   RBC 4.62 3.87 - 5.11 MIL/uL   Hemoglobin 13.2 12.0 - 15.0 g/dL   HCT 09.8 11.9 - 14.7 %   MCV 96.1 80.0 - 100.0 fL   MCH 28.6 26.0 - 34.0 pg   MCHC 29.7 (L) 30.0 - 36.0 g/dL   RDW 82.9 56.2 - 13.0 %   Platelets 201 150 - 400 K/uL   nRBC 0.0 0.0 - 0.2 %  Magnesium     Status: None   Collection Time: 08/21/23  3:17 AM  Result Value Ref Range   Magnesium 2.3 1.7 - 2.4 mg/dL  Phosphorus     Status: None   Collection Time: 08/21/23  3:17 AM  Result Value Ref Range   Phosphorus 2.8 2.5 - 4.6 mg/dL  Brain natriuretic peptide     Status: None   Collection Time: 08/21/23  3:17 AM  Result Value Ref Range   B Natriuretic Peptide 37.7 0.0 - 100.0 pg/mL  Blood gas, venous     Status: Abnormal   Collection Time: 08/21/23  3:17 AM  Result Value Ref Range   pH, Peter Minium  7.36 7.25 - 7.43   pCO2, Ven 85 (HH) 44 - 60 mmHg   pO2, Ven 55 (H) 32 - 45 mmHg   Bicarbonate 48.0 (H) 20.0 - 28.0 mmol/L   Acid-Base Excess 17.7 (H) 0.0 - 2.0 mmol/L   O2 Saturation 94.3 %   Patient temperature 36.5   Glucose, capillary     Status: Abnormal   Collection Time: 08/21/23  8:14 AM  Result Value Ref Range   Glucose-Capillary 169 (H) 70 - 99 mg/dL    I have reviewed pertinent nursing notes, vitals, labs, and images as necessary. I have ordered labwork to follow up on as indicated.  I have reviewed the last notes from staff over past 24 hours. I have discussed patient's care plan and test results with nursing staff, CM/SW, and other staff as appropriate.  Critical care time to evaluate and treat this patient was 55 minutes.  Independent of separate billable services  This patient is critically ill with the following life-threatening issues requiring my presence at the bedside: Hemodynamic instability  requiring titration of medications Oxygenation/ventilation instability requiring frequent modifications of support Cardiac rhythm disturbances requiring evaluation and/or interventions Fluctuations in neurologic function requiring evaluation and/or interventions and/or fluid/volume titration    LOS: 1 day   Lewie Chamber, MD [facility] 08/21/2023, 11:44 AM

## 2023-08-21 NOTE — Assessment & Plan Note (Signed)
-   Follow-up A1c - Continue SSI and CBG monitoring for now

## 2023-08-21 NOTE — Progress Notes (Signed)
 Pt transferred from ED to ICU on the bipap with RN without any complications.

## 2023-08-21 NOTE — Assessment & Plan Note (Signed)
 Continue Eliquis and Toprol

## 2023-08-21 NOTE — Assessment & Plan Note (Signed)
 On nightly BiPAP at home.

## 2023-08-21 NOTE — Assessment & Plan Note (Signed)
-   per MRI brain 01/08/23: "Remote bilateral occipital infarcts. Small remote left thalamic lacunar infarct. Additional scattered T2/FLAIR hyperintensities the white matter, compatible with chronic  microvascular ischemic disease." - continue Eliquis and Lipitor

## 2023-08-21 NOTE — Assessment & Plan Note (Signed)
-   No obvious etiology aside from ongoing tobacco use - COVID, flu, RSV, RVP negative - CXR unremarkable  -Continue nebulizers and steroids - Continue BiPAP night; has improved and no longer in need of PRN -De-escalate back to home oxygen setting and continue Levaquin and prednisone at discharge to complete courses

## 2023-08-21 NOTE — Progress Notes (Signed)
   08/21/23 0015  BiPAP/CPAP/SIPAP  $ Non-Invasive Ventilator  Non-Invasive Vent Initial  $ Face Mask Medium Yes  BiPAP/CPAP/SIPAP Pt Type Adult  BiPAP/CPAP/SIPAP V60  Mask Type Full face mask  Dentures removed? Not applicable  Mask Size Medium  Set Rate 22 breaths/min  Respiratory Rate 25 breaths/min  IPAP 18 cmH20  EPAP 7 cmH2O  FiO2 (%) 40 %  Minute Ventilation 13  Leak 1  Peak Inspiratory Pressure (PIP) 19  Tidal Volume (Vt) 445  Patient Home Equipment No  Auto Titrate No  Press High Alarm 35 cmH2O  Press Low Alarm 5 cmH2O  CPAP/SIPAP surface wiped down Yes  BiPAP/CPAP /SiPAP Vitals  Pulse Rate (!) 110  Resp (!) 27  SpO2 97 %  Bilateral Breath Sounds Diminished  MEWS Score/Color  MEWS Score 3  MEWS Score Color Yellow   Pt placed on bipap per Order and pt is tolerating it well at this time. RN aware.

## 2023-08-21 NOTE — Hospital Course (Signed)
 Yvette Booth is a 63 year old female with PMH COPD, chronic hypoxia on 2 to 4 L oxygen at home, nightly BiPAP, ongoing tobacco use, PAF, HTN, hyperthyroidism, OSA who presented with worsening dyspnea.  She was found to be hypoxic on room air with EMS and brought to the hospital. Respiratory swabs were all negative including RVP, COVID, flu, RSV.  CXR was compatible with underlying emphysema but no other acute findings. Initial VBG was consistent with hypercarbic respiratory failure and she was placed on BiPAP on admission and treated for COPD exac.

## 2023-08-22 DIAGNOSIS — J9621 Acute and chronic respiratory failure with hypoxia: Secondary | ICD-10-CM | POA: Diagnosis not present

## 2023-08-22 DIAGNOSIS — J9622 Acute and chronic respiratory failure with hypercapnia: Secondary | ICD-10-CM | POA: Diagnosis not present

## 2023-08-22 DIAGNOSIS — J441 Chronic obstructive pulmonary disease with (acute) exacerbation: Secondary | ICD-10-CM | POA: Diagnosis not present

## 2023-08-22 DIAGNOSIS — Z716 Tobacco abuse counseling: Secondary | ICD-10-CM | POA: Diagnosis not present

## 2023-08-22 LAB — BASIC METABOLIC PANEL
Anion gap: 10 (ref 5–15)
BUN: 26 mg/dL — ABNORMAL HIGH (ref 8–23)
CO2: 40 mmol/L — ABNORMAL HIGH (ref 22–32)
Calcium: 9.3 mg/dL (ref 8.9–10.3)
Chloride: 91 mmol/L — ABNORMAL LOW (ref 98–111)
Creatinine, Ser: 0.64 mg/dL (ref 0.44–1.00)
GFR, Estimated: >60 mL/min (ref >60)
Glucose, Bld: 150 mg/dL — ABNORMAL HIGH (ref 70–99)
Potassium: 4.8 mmol/L (ref 3.5–5.1)
Sodium: 141 mmol/L (ref 135–145)

## 2023-08-22 LAB — GLUCOSE, CAPILLARY
Glucose-Capillary: 118 mg/dL — ABNORMAL HIGH (ref 70–99)
Glucose-Capillary: 146 mg/dL — ABNORMAL HIGH (ref 70–99)
Glucose-Capillary: 185 mg/dL — ABNORMAL HIGH (ref 70–99)
Glucose-Capillary: 158 mg/dL — ABNORMAL HIGH (ref 70–99)

## 2023-08-22 LAB — MAGNESIUM: Magnesium: 2.5 mg/dL — ABNORMAL HIGH (ref 1.7–2.4)

## 2023-08-22 LAB — HIV ANTIBODY (ROUTINE TESTING W REFLEX): HIV Screen 4th Generation wRfx: NONREACTIVE

## 2023-08-22 NOTE — Plan of Care (Signed)
  Problem: Clinical Measurements: Goal: Ability to maintain clinical measurements within normal limits will improve Outcome: Progressing Goal: Diagnostic test results will improve Outcome: Progressing Goal: Respiratory complications will improve Outcome: Progressing   Problem: Activity: Goal: Risk for activity intolerance will decrease Outcome: Progressing   Problem: Nutrition: Goal: Adequate nutrition will be maintained Outcome: Progressing   Problem: Coping: Goal: Level of anxiety will decrease Outcome: Progressing

## 2023-08-22 NOTE — Progress Notes (Signed)
 Progress Note    Yvette Booth   JYN:[id number]  DOB: 1960-09-28  DOA: 08/20/2023     2 PCP: [facility]  Initial CC: hypoxic  Hospital Course: Yvette Booth is a 63 year old female with PMH COPD, chronic hypoxia on 2 to 4 L oxygen at home, nightly BiPAP, ongoing tobacco use, PAF, HTN, hyperthyroidism, OSA who presented with worsening dyspnea.  She was found to be hypoxic on room air with EMS and brought to the hospital. Respiratory swabs were all negative including RVP, COVID, flu, RSV.  CXR was compatible with underlying emphysema but no other acute findings. Initial VBG was consistent with hypercarbic respiratory failure and she was placed on BiPAP on admission and treated for COPD exac.   Interval History:  Noted overnight.  Breathing has improved and only on BiPAP at night now. Still somewhat short of breath this morning and breath sounds are still tight.  Continuing hospitalization 1 more night and tentative plan for discharge tomorrow.  Assessment and Plan: * COPD exacerbation (HCC) - No obvious etiology aside from ongoing tobacco use - COVID, flu, RSV, RVP negative - CXR unremarkable  -Continue nebulizers and steroids - Continue BiPAP night; has improved and no longer in need of PRN  Acute on chronic respiratory failure with hypoxia and hypercapnia (HCC) - On 2 to 4 L oxygen at home - down to ~4 L this morning   Hyperglycemia - A1c 5.1% - Continue SSI and CBG monitoring for now  History of CVA (cerebrovascular accident) - per MRI brain 01/08/23: "Remote bilateral occipital infarcts. Small remote left thalamic lacunar infarct. Additional scattered T2/FLAIR hyperintensities the white matter, compatible with chronic  microvascular ischemic disease." - continue Eliquis and Lipitor  OSA (obstructive sleep apnea) - On nightly BiPAP at home  PAF (paroxysmal atrial fibrillation) (HCC) - Continue Eliquis and Toprol  Hyperthyroidism - Continue  methimazole  Encounter for smoking cessation counseling - ongoing tobacco use; approx 150 pack year history per H&P - continue nicotine patch   Old records reviewed in assessment of this patient  Antimicrobials: Azithromycin and Rocephin 08/20/2023 x 1 Levaquin 08/21/2023 >> current  DVT prophylaxis:   apixaban (ELIQUIS) tablet 5 mg   Code Status:   Code Status: Full Code  Mobility Assessment (Last 72 Hours)     Mobility Assessment   No documentation.           Barriers to discharge: none Disposition Plan:  Home HH orders placed: n/a Status is: Inpt  Objective: Blood pressure (!) 167/65, pulse 90, temperature 97.9 F (36.6 C), temperature source Oral, resp. rate 19, height 5\' 3"  (1.6 m), weight 74.4 kg, SpO2 (!) 87%.  Examination:  Physical Exam Constitutional:      Appearance: Normal appearance.  HENT:     Head: Normocephalic and atraumatic.     Mouth/Throat:     Mouth: Mucous membranes are moist.  Eyes:     Extraocular Movements: Extraocular movements intact.  Cardiovascular:     Rate and Rhythm: Normal rate and regular rhythm.  Pulmonary:     Effort: Pulmonary effort is normal. No respiratory distress.     Breath sounds: Decreased air movement present. Wheezing present.     Comments: Tight breath sounds Abdominal:     General: Bowel sounds are normal. There is no distension.     Palpations: Abdomen is soft.     Tenderness: There is no abdominal tenderness.  Musculoskeletal:        General: Normal range of  motion.     Cervical back: Normal range of motion and neck supple.  Skin:    General: Skin is warm and dry.  Neurological:     General: No focal deficit present.     Mental Status: She is alert.  Psychiatric:        Mood and Affect: Mood normal.      Consultants:    Procedures:    Data Reviewed: Results for orders placed or performed during the hospital encounter of 08/20/23 (from the past 24 hours)  Glucose, capillary     Status:  Abnormal   Collection Time: 08/21/23  4:08 PM  Result Value Ref Range   Glucose-Capillary 163 (H) 70 - 99 mg/dL   Comment 1 Notify RN    Comment 2 Document in Chart   Glucose, capillary     Status: Abnormal   Collection Time: 08/21/23  9:03 PM  Result Value Ref Range   Glucose-Capillary 122 (H) 70 - 99 mg/dL   Comment 1 Notify RN    Comment 2 Document in Chart   HIV Antibody (routine testing w rflx)     Status: None   Collection Time: 08/22/23  2:42 AM  Result Value Ref Range   HIV Screen 4th Generation wRfx Non Reactive Non Reactive  Basic metabolic panel     Status: Abnormal   Collection Time: 08/22/23  2:42 AM  Result Value Ref Range   Sodium 141 135 - 145 mmol/L   Potassium 4.8 3.5 - 5.1 mmol/L   Chloride 91 (L) 98 - 111 mmol/L   CO2 40 (H) 22 - 32 mmol/L   Glucose, Bld 150 (H) 70 - 99 mg/dL   BUN 26 (H) 8 - 23 mg/dL   Creatinine, Ser 1.19 0.44 - 1.00 mg/dL   Calcium 9.3 8.9 - 14.7 mg/dL   GFR, Estimated >82 >95 mL/min   Anion gap 10 5 - 15  Magnesium     Status: Abnormal   Collection Time: 08/22/23  2:42 AM  Result Value Ref Range   Magnesium 2.5 (H) 1.7 - 2.4 mg/dL  Glucose, capillary     Status: Abnormal   Collection Time: 08/22/23  7:41 AM  Result Value Ref Range   Glucose-Capillary 118 (H) 70 - 99 mg/dL   Comment 1 Notify RN    Comment 2 Document in Chart   Glucose, capillary     Status: Abnormal   Collection Time: 08/22/23 11:24 AM  Result Value Ref Range   Glucose-Capillary 146 (H) 70 - 99 mg/dL   Comment 1 Notify RN    Comment 2 Document in Chart     I have reviewed pertinent nursing notes, vitals, labs, and images as necessary. I have ordered labwork to follow up on as indicated.  I have reviewed the last notes from staff over past 24 hours. I have discussed patient's care plan and test results with nursing staff, CM/SW, and other staff as appropriate.  Time spent: Greater than 50% of the 55 minute visit was spent in counseling/coordination of care for  the patient as laid out in the A&P.    LOS: 2 days   Lewie Chamber, MD [facility] 08/22/2023, 1:24 PM

## 2023-08-22 NOTE — Plan of Care (Signed)
  Problem: Activity: Goal: Risk for activity intolerance will decrease Outcome: Progressing   Problem: Nutrition: Goal: Adequate nutrition will be maintained Outcome: Progressing   Problem: Coping: Goal: Level of anxiety will decrease Outcome: Progressing   Problem: Elimination: Goal: Will not experience complications related to urinary retention Outcome: Progressing   Problem: Pain Managment: Goal: General experience of comfort will improve and/or be controlled Outcome: Progressing   Problem: Safety: Goal: Ability to remain free from injury will improve Outcome: Progressing   Problem: Skin Integrity: Goal: Risk for impaired skin integrity will decrease Outcome: Progressing   Problem: Fluid Volume: Goal: Ability to maintain a balanced intake and output will improve Outcome: Progressing   Problem: Tissue Perfusion: Goal: Adequacy of tissue perfusion will improve Outcome: Progressing

## 2023-08-22 NOTE — Progress Notes (Signed)
   08/22/23 1027  TOC Brief Assessment  Insurance and Status Reviewed  Patient has primary care physician Yes  Home environment has been reviewed apartment  Prior level of function: independent  Social Drivers of Health Review SDOH reviewed no interventions necessary  Readmission risk has been reviewed Yes  Transition of care needs no transition of care needs at this time

## 2023-08-23 ENCOUNTER — Other Ambulatory Visit (HOSPITAL_COMMUNITY): Payer: Self-pay

## 2023-08-23 DIAGNOSIS — J9621 Acute and chronic respiratory failure with hypoxia: Secondary | ICD-10-CM | POA: Diagnosis not present

## 2023-08-23 DIAGNOSIS — Z716 Tobacco abuse counseling: Secondary | ICD-10-CM | POA: Diagnosis not present

## 2023-08-23 DIAGNOSIS — J441 Chronic obstructive pulmonary disease with (acute) exacerbation: Secondary | ICD-10-CM | POA: Diagnosis not present

## 2023-08-23 DIAGNOSIS — J9622 Acute and chronic respiratory failure with hypercapnia: Secondary | ICD-10-CM | POA: Diagnosis not present

## 2023-08-23 LAB — GLUCOSE, CAPILLARY
Glucose-Capillary: 168 mg/dL — ABNORMAL HIGH (ref 70–99)
Glucose-Capillary: 126 mg/dL — ABNORMAL HIGH (ref 70–99)

## 2023-08-23 MED ORDER — PREDNISONE 20 MG PO TABS
40.0000 mg | ORAL_TABLET | Freq: Every day | ORAL | 0 refills | Status: AC
  Filled 2023-08-23: qty 10, 5d supply, fill #0

## 2023-08-23 MED ORDER — MUPIROCIN 2 % EX OINT: 1.0000 | TOPICAL_OINTMENT | Freq: Two times a day (BID) | CUTANEOUS | Status: DC

## 2023-08-23 MED ORDER — CHLORHEXIDINE GLUCONATE CLOTH 2 % EX PADS: 6.0000 | MEDICATED_PAD | Freq: Every day | CUTANEOUS | Status: DC

## 2023-08-23 MED ORDER — LEVOFLOXACIN 500 MG PO TABS
500.0000 mg | ORAL_TABLET | Freq: Every day | ORAL | 0 refills | Status: AC
  Filled 2023-08-23: qty 3, 3d supply, fill #0

## 2023-08-23 NOTE — Discharge Summary (Signed)
 Physician Discharge Summary   Yvette Booth WUJ:[id number] DOB: 1960-12-15 DOA: 08/20/2023  PCP: [facility]  Admit date: 08/20/2023 Discharge date: 08/23/2023  Admitted From: Home Disposition:  Home  Discharging physician: Lewie Chamber, MD Barriers to discharge: none  Recommendations at discharge: Encouraged smoking cessation   Discharge Condition: stable CODE STATUS: Full Diet recommendation:  Diet Orders (From admission, onward)     Start     Ordered   08/23/23 0000  Diet general        08/23/23 1129   08/21/23 0941  Diet regular Fluid consistency: Thin  Diet effective now       Question:  Fluid consistency:  Answer:  Thin   08/21/23 0940            Hospital Course: Yvette Booth is a 63 year old female with PMH COPD, chronic hypoxia on 2 to 4 L oxygen at home, nightly BiPAP, ongoing tobacco use, PAF, HTN, hyperthyroidism, OSA who presented with worsening dyspnea.  She was found to be hypoxic on room air with EMS and brought to the hospital. Respiratory swabs were all negative including RVP, COVID, flu, RSV.  CXR was compatible with underlying emphysema but no other acute findings. Initial VBG was consistent with hypercarbic respiratory failure and she was placed on BiPAP on admission and treated for COPD exac.   Assessment and Plan: * COPD exacerbation (HCC) - No obvious etiology aside from ongoing tobacco use - COVID, flu, RSV, RVP negative - CXR unremarkable  -Continue nebulizers and steroids - Continue BiPAP night; has improved and no longer in need of PRN -De-escalate back to home oxygen setting and continue Levaquin and prednisone at discharge to complete courses  Acute on chronic respiratory failure with hypoxia and hypercapnia (HCC) - On 2 to 4 L oxygen at home - Discharged back on home oxygen settings  Hyperglycemia - A1c 5.1% -Likely steroid-induced  History of CVA (cerebrovascular accident) - per MRI brain 01/08/23: "Remote  bilateral occipital infarcts. Small remote left thalamic lacunar infarct. Additional scattered T2/FLAIR hyperintensities the white matter, compatible with chronic  microvascular ischemic disease." - continue Eliquis and Lipitor  OSA (obstructive sleep apnea) - On nightly BiPAP at home  PAF (paroxysmal atrial fibrillation) (HCC) - Continue Eliquis and Toprol  Hyperthyroidism - Continue methimazole  Encounter for smoking cessation counseling - ongoing tobacco use; approx 150 pack year history per H&P - continue nicotine patch    The patient's acute and chronic medical conditions were treated accordingly. On day of discharge, patient was felt deemed stable for discharge. Patient/family member advised to call PCP or come back to ER if needed.   Principal Diagnosis: COPD exacerbation [facility])  Discharge Diagnoses: Active Hospital Problems   Diagnosis Date Noted   COPD exacerbation (HCC) 08/20/2023    Priority: 1.   Acute on chronic respiratory failure with hypoxia and hypercapnia (HCC) 08/20/2023    Priority: 2.   Hyperthyroidism 08/21/2023   PAF (paroxysmal atrial fibrillation) (HCC) 08/21/2023   OSA (obstructive sleep apnea) 08/21/2023   History of CVA (cerebrovascular accident) 08/21/2023   Hyperglycemia 08/21/2023   Encounter for smoking cessation counseling 08/20/2023    Resolved Hospital Problems  No resolved problems to display.     Discharge Instructions     Diet general   Complete by: As directed    Increase activity slowly   Complete by: As directed       Allergies as of 08/23/2023       Reactions   Seroquel [  quetiapine] Other (See Comments)   Delirium and confusion         Medication List     TAKE these medications    albuterol 108 (90 Base) MCG/ACT inhaler Commonly known as: VENTOLIN HFA Inhale 2 puffs into the lungs every 6 (six) hours as needed for wheezing or shortness of breath.   Eliquis 5 MG Tabs tablet Generic drug: apixaban Take 5 mg by  mouth 2 (two) times daily.   fluticasone-salmeterol 250-50 MCG/ACT Aepb Commonly known as: ADVAIR Inhale 1 puff into the lungs in the morning and at bedtime.   furosemide 40 MG tablet Commonly known as: LASIX Take 40 mg by mouth daily.   Incruse Ellipta 62.5 MCG/ACT Aepb Generic drug: umeclidinium bromide Inhale 1 puff into the lungs daily.   levofloxacin 500 MG tablet Commonly known as: LEVAQUIN Take 1 tablet (500 mg total) by mouth daily for 3 days.   methimazole 10 MG tablet Commonly known as: TAPAZOLE Take 20 mg by mouth daily.   metoprolol succinate 50 MG 24 hr tablet Commonly known as: TOPROL-XL Take 50 mg by mouth daily. Take with or immediately following a meal.   predniSONE 20 MG tablet Commonly known as: DELTASONE Take 2 tablets (40 mg total) by mouth daily with breakfast for 5 days.        Allergies  Allergen Reactions   Seroquel [Quetiapine] Other (See Comments)    Delirium and confusion     Consultations:  Procedures:   Discharge Exam: BP 135/69   Pulse 80   Temp 98 F (36.7 C) (Oral)   Resp (!) 27   Ht 5\' 3"  (1.6 m)   Wt 74.4 kg   SpO2 98%   BMI 29.06 kg/m  Physical Exam Constitutional:      Appearance: Normal appearance.  HENT:     Head: Normocephalic and atraumatic.     Mouth/Throat:     Mouth: Mucous membranes are moist.  Eyes:     Extraocular Movements: Extraocular movements intact.  Cardiovascular:     Rate and Rhythm: Normal rate and regular rhythm.  Pulmonary:     Effort: Pulmonary effort is normal. No respiratory distress.     Breath sounds: Decreased air movement present. Wheezing (Minimal and improved) present.     Comments: Tight breath sounds Abdominal:     General: Bowel sounds are normal. There is no distension.     Palpations: Abdomen is soft.     Tenderness: There is no abdominal tenderness.  Musculoskeletal:        General: Normal range of motion.     Cervical back: Normal range of motion and neck supple.   Skin:    General: Skin is warm and dry.  Neurological:     General: No focal deficit present.     Mental Status: She is alert.  Psychiatric:        Mood and Affect: Mood normal.      The results of significant diagnostics from this hospitalization (including imaging, microbiology, ancillary and laboratory) are listed below for reference.   Microbiology: Recent Results (from the past 240 hours)  Resp panel by RT-PCR (RSV, Flu A&B, Covid) Anterior Nasal Swab     Status: None   Collection Time: 08/20/23  7:25 PM   Specimen: Anterior Nasal Swab  Result Value Ref Range Status   SARS Coronavirus 2 by RT PCR NEGATIVE NEGATIVE Final    Comment: (NOTE) SARS-CoV-2 target nucleic acids are NOT DETECTED.  The SARS-CoV-2 RNA is  generally detectable in upper respiratory specimens during the acute phase of infection. The lowest concentration of SARS-CoV-2 viral copies this assay can detect is 138 copies/mL. A negative result does not preclude SARS-Cov-2 infection and should not be used as the sole basis for treatment or other patient management decisions. A negative result may occur with  improper specimen collection/handling, submission of specimen other than nasopharyngeal swab, presence of viral mutation(s) within the areas targeted by this assay, and inadequate number of viral copies(<138 copies/mL). A negative result must be combined with clinical observations, patient history, and epidemiological information. The expected result is Negative.  Fact Sheet for Patients:  BloggerCourse.com  Fact Sheet for Healthcare Providers:  SeriousBroker.it  This test is no t yet approved or cleared by the [place] FDA and  has been authorized for detection and/or diagnosis of SARS-CoV-2 by FDA under an Emergency Use Authorization (EUA). This EUA will remain  in effect (meaning this test can be used) for the duration of the COVID-19  declaration under Section 564(b)(1) of the Act, 21 U.S.C.section 360bbb-3(b)(1), unless the authorization is terminated  or revoked sooner.       Influenza A by PCR NEGATIVE NEGATIVE Final   Influenza B by PCR NEGATIVE NEGATIVE Final    Comment: (NOTE) The Xpert Xpress SARS-CoV-2/FLU/RSV plus assay is intended as an aid in the diagnosis of influenza from Nasopharyngeal swab specimens and should not be used as a sole basis for treatment. Nasal washings and aspirates are unacceptable for Xpert Xpress SARS-CoV-2/FLU/RSV testing.  Fact Sheet for Patients: BloggerCourse.com  Fact Sheet for Healthcare Providers: SeriousBroker.it  This test is not yet approved or cleared by the [place] FDA and has been authorized for detection and/or diagnosis of SARS-CoV-2 by FDA under an Emergency Use Authorization (EUA). This EUA will remain in effect (meaning this test can be used) for the duration of the COVID-19 declaration under Section 564(b)(1) of the Act, 21 U.S.C. section 360bbb-3(b)(1), unless the authorization is terminated or revoked.     Resp Syncytial Virus by PCR NEGATIVE NEGATIVE Final    Comment: (NOTE) Fact Sheet for Patients: BloggerCourse.com  Fact Sheet for Healthcare Providers: SeriousBroker.it  This test is not yet approved or cleared by the [place] FDA and has been authorized for detection and/or diagnosis of SARS-CoV-2 by FDA under an Emergency Use Authorization (EUA). This EUA will remain in effect (meaning this test can be used) for the duration of the COVID-19 declaration under Section 564(b)(1) of the Act, 21 U.S.C. section 360bbb-3(b)(1), unless the authorization is terminated or revoked.  Performed at [facility], [address] [place], [place] [address]   Respiratory (~20 pathogens) panel by PCR     Status: None   Collection  Time: 08/20/23  7:25 PM   Specimen: Nasopharyngeal Swab; Respiratory  Result Value Ref Range Status   Adenovirus NOT DETECTED NOT DETECTED Final   Coronavirus 229E NOT DETECTED NOT DETECTED Final    Comment: (NOTE) The Coronavirus on the Respiratory Panel, DOES NOT test for the novel  Coronavirus (2019 nCoV)    Coronavirus HKU1 NOT DETECTED NOT DETECTED Final   Coronavirus NL63 NOT DETECTED NOT DETECTED Final   Coronavirus OC43 NOT DETECTED NOT DETECTED Final   Metapneumovirus NOT DETECTED NOT DETECTED Final   Rhinovirus / Enterovirus NOT DETECTED NOT DETECTED Final   Influenza A NOT DETECTED NOT DETECTED Final   Influenza B NOT DETECTED NOT DETECTED Final   Parainfluenza Virus 1 NOT DETECTED NOT DETECTED Final  Parainfluenza Virus 2 NOT DETECTED NOT DETECTED Final   Parainfluenza Virus 3 NOT DETECTED NOT DETECTED Final   Parainfluenza Virus 4 NOT DETECTED NOT DETECTED Final   Respiratory Syncytial Virus NOT DETECTED NOT DETECTED Final   Bordetella pertussis NOT DETECTED NOT DETECTED Final   Bordetella Parapertussis NOT DETECTED NOT DETECTED Final   Chlamydophila pneumoniae NOT DETECTED NOT DETECTED Final   Mycoplasma pneumoniae NOT DETECTED NOT DETECTED Final    Comment: Performed at [facility], [address], [place], [place] [address]  MRSA Next Gen by PCR, Nasal     Status: Abnormal   Collection Time: 08/21/23  1:49 AM   Specimen: Nasal Mucosa; Nasal Swab  Result Value Ref Range Status   MRSA by PCR Next Gen DETECTED (A) NOT DETECTED Final    Comment: CRITICAL RESULT CALLED TO, READ BACK BY AND VERIFIED WITH: Tamera Reason, RN @ [phone] [facility] 08/21/23 (NOTE) The GeneXpert MRSA Assay (FDA approved for NASAL specimens only), is one component of a comprehensive MRSA colonization surveillance program. It is not intended to diagnose MRSA infection nor to guide or monitor treatment for MRSA infections. Test performance is not FDA approved in patients less than 31  years old. Performed at [facility], [address], [place], [place] [address]      Labs: BNP (last 3 results) Recent Labs    08/21/23 0317  BNP 37.7   Basic Metabolic Panel: Recent Labs  Lab 08/20/23 1937 08/21/23 0317 08/22/23 0242  NA 141 141 141  K 4.4 4.2 4.8  CL 90* 92* 91*  CO2 42* 39* 40*  GLUCOSE 135* 309* 150*  BUN 15 22 26*  CREATININE 0.64 0.71 0.64  CALCIUM 9.0 9.3 9.3  MG  --  2.3 2.5*  PHOS  --  2.8  --    Liver Function Tests: Recent Labs  Lab 08/20/23 1937  AST 11*  ALT 9  ALKPHOS 67  BILITOT 0.5  PROT 6.9  ALBUMIN 3.7   No results for input(s): "LIPASE", "AMYLASE" in the last 168 hours. No results for input(s): "AMMONIA" in the last 168 hours. CBC: Recent Labs  Lab 08/20/23 1937 08/21/23 0317  WBC 6.6 6.8  NEUTROABS 4.7  --   HGB 15.0 13.2  HCT 49.3* 44.4  MCV 96.1 96.1  PLT 229 201   Cardiac Enzymes: No results for input(s): "CKTOTAL", "CKMB", "CKMBINDEX", "TROPONINI" in the last 168 hours. BNP: Invalid input(s): "POCBNP" CBG: Recent Labs  Lab 08/22/23 1124 08/22/23 1610 08/22/23 2124 08/23/23 0735 08/23/23 1145  GLUCAP 146* 185* 158* 168* 126*   D-Dimer No results for input(s): "DDIMER" in the last 72 hours. Hgb A1c Recent Labs    08/21/23 1019  HGBA1C 5.1   Lipid Profile No results for input(s): "CHOL", "HDL", "LDLCALC", "TRIG", "CHOLHDL", "LDLDIRECT" in the last 72 hours. Thyroid function studies No results for input(s): "TSH", "T4TOTAL", "T3FREE", "THYROIDAB" in the last 72 hours.  Invalid input(s): "FREET3" Anemia work up No results for input(s): "VITAMINB12", "FOLATE", "FERRITIN", "TIBC", "IRON", "RETICCTPCT" in the last 72 hours. Urinalysis No results found for: "COLORURINE", "APPEARANCEUR", "LABSPEC", "PHURINE", "GLUCOSEU", "HGBUR", "BILIRUBINUR", "KETONESUR", "PROTEINUR", "UROBILINOGEN", "NITRITE", "LEUKOCYTESUR" Sepsis Labs Recent Labs  Lab 08/20/23 1937 08/21/23 0317  WBC  6.6 6.8   Microbiology Recent Results (from the past 240 hours)  Resp panel by RT-PCR (RSV, Flu A&B, Covid) Anterior Nasal Swab     Status: None   Collection Time: 08/20/23  7:25 PM   Specimen: Anterior Nasal Swab  Result Value Ref Range Status   SARS Coronavirus 2 by RT PCR NEGATIVE NEGATIVE Final    Comment: (NOTE) SARS-CoV-2 target nucleic acids are NOT DETECTED.  The SARS-CoV-2 RNA is generally detectable in upper respiratory specimens during the acute phase of infection. The lowest concentration of SARS-CoV-2 viral copies this assay can detect is 138 copies/mL. A negative result does not preclude SARS-Cov-2 infection and should not be used as the sole basis for treatment or other patient management decisions. A negative result may occur with  improper specimen collection/handling, submission of specimen other than nasopharyngeal swab, presence of viral mutation(s) within the areas targeted by this assay, and inadequate number of viral copies(<138 copies/mL). A negative result must be combined with clinical observations, patient history, and epidemiological information. The expected result is Negative.  Fact Sheet for Patients:  BloggerCourse.com  Fact Sheet for Healthcare Providers:  SeriousBroker.it  This test is no t yet approved or cleared by the [place] FDA and  has been authorized for detection and/or diagnosis of SARS-CoV-2 by FDA under an Emergency Use Authorization (EUA). This EUA will remain  in effect (meaning this test can be used) for the duration of the COVID-19 declaration under Section 564(b)(1) of the Act, 21 U.S.C.section 360bbb-3(b)(1), unless the authorization is terminated  or revoked sooner.       Influenza A by PCR NEGATIVE NEGATIVE Final   Influenza B by PCR NEGATIVE NEGATIVE Final    Comment: (NOTE) The Xpert Xpress SARS-CoV-2/FLU/RSV plus assay is intended as an aid in the diagnosis of  influenza from Nasopharyngeal swab specimens and should not be used as a sole basis for treatment. Nasal washings and aspirates are unacceptable for Xpert Xpress SARS-CoV-2/FLU/RSV testing.  Fact Sheet for Patients: BloggerCourse.com  Fact Sheet for Healthcare Providers: SeriousBroker.it  This test is not yet approved or cleared by the [place] FDA and has been authorized for detection and/or diagnosis of SARS-CoV-2 by FDA under an Emergency Use Authorization (EUA). This EUA will remain in effect (meaning this test can be used) for the duration of the COVID-19 declaration under Section 564(b)(1) of the Act, 21 U.S.C. section 360bbb-3(b)(1), unless the authorization is terminated or revoked.     Resp Syncytial Virus by PCR NEGATIVE NEGATIVE Final    Comment: (NOTE) Fact Sheet for Patients: BloggerCourse.com  Fact Sheet for Healthcare Providers: SeriousBroker.it  This test is not yet approved or cleared by the [place] FDA and has been authorized for detection and/or diagnosis of SARS-CoV-2 by FDA under an Emergency Use Authorization (EUA). This EUA will remain in effect (meaning this test can be used) for the duration of the COVID-19 declaration under Section 564(b)(1) of the Act, 21 U.S.C. section 360bbb-3(b)(1), unless the authorization is terminated or revoked.  Performed at [facility], [address] [place], [place] [address]   Respiratory (~20 pathogens) panel by PCR     Status: None   Collection Time: 08/20/23  7:25 PM   Specimen: Nasopharyngeal Swab; Respiratory  Result Value Ref Range Status   Adenovirus NOT DETECTED NOT DETECTED Final   Coronavirus 229E NOT DETECTED NOT DETECTED Final    Comment: (NOTE) The Coronavirus on the Respiratory Panel, DOES NOT test for the novel  Coronavirus (2019 nCoV)    Coronavirus HKU1 NOT DETECTED  NOT DETECTED Final   Coronavirus NL63 NOT DETECTED NOT DETECTED Final   Coronavirus OC43 NOT DETECTED NOT DETECTED Final   Metapneumovirus NOT DETECTED NOT DETECTED Final   Rhinovirus / Enterovirus  NOT DETECTED NOT DETECTED Final   Influenza A NOT DETECTED NOT DETECTED Final   Influenza B NOT DETECTED NOT DETECTED Final   Parainfluenza Virus 1 NOT DETECTED NOT DETECTED Final   Parainfluenza Virus 2 NOT DETECTED NOT DETECTED Final   Parainfluenza Virus 3 NOT DETECTED NOT DETECTED Final   Parainfluenza Virus 4 NOT DETECTED NOT DETECTED Final   Respiratory Syncytial Virus NOT DETECTED NOT DETECTED Final   Bordetella pertussis NOT DETECTED NOT DETECTED Final   Bordetella Parapertussis NOT DETECTED NOT DETECTED Final   Chlamydophila pneumoniae NOT DETECTED NOT DETECTED Final   Mycoplasma pneumoniae NOT DETECTED NOT DETECTED Final    Comment: Performed at [facility], [address], [place], [place] [address]  MRSA Next Gen by PCR, Nasal     Status: Abnormal   Collection Time: 08/21/23  1:49 AM   Specimen: Nasal Mucosa; Nasal Swab  Result Value Ref Range Status   MRSA by PCR Next Gen DETECTED (A) NOT DETECTED Final    Comment: CRITICAL RESULT CALLED TO, READ BACK BY AND VERIFIED WITH: Tamera Reason, RN @ [phone] [facility] 08/21/23 (NOTE) The GeneXpert MRSA Assay (FDA approved for NASAL specimens only), is one component of a comprehensive MRSA colonization surveillance program. It is not intended to diagnose MRSA infection nor to guide or monitor treatment for MRSA infections. Test performance is not FDA approved in patients less than 78 years old. Performed at [facility], [address], [place], [place] [address]     Procedures/Studies: DG Chest Portable 1 View Result Date: 08/20/2023 CLINICAL DATA:  Dyspnea EXAM: PORTABLE CHEST 1 VIEW COMPARISON:  04/23/2023 FINDINGS: Lungs are symmetrically well inflated. Paucity of vasculature in the lung apices is in keeping with  underlying emphysema. No confluent pulmonary infiltrate. No pneumothorax or pleural effusion. Cardiac size within normal limits. Pulmonary vascularity is otherwise normal. No acute bone abnormality. IMPRESSION: 1. Emphysema. No active disease. Electronically Signed   By: Helyn Numbers M.D.   On: 08/20/2023 20:46     Time coordinating discharge: Over 30 minutes    Lewie Chamber, MD  [facility] 08/23/2023, 2:09 PM

## 2023-08-23 NOTE — TOC Transition Note (Signed)
 Transition of Care [facility]) - Discharge Note  Patient Details  Name: Yvette Booth MRN: [id number] Date of Birth: 11/28/1960  Transition of [facility] CM/SW Contact:  Ewing Schlein, LCSW Phone Number: 08/23/2023, 12:53 PM  Clinical Narrative: CSW spoke with patient as patient will need a travel oxygen tank to discharge home. Patient reported she receives her oxygen through [employer]. CSW called Ashly with [employer] to have a tank delivered to patient's room as patient does not have a tank at the hospital. [employer] to deliver tank to patient's room. TOC signing off.  Final next level of care: Home/Self Care Barriers to Discharge: Barriers Resolved  Discharge Plan and Services Additional resources added to the After Visit Summary for          DME Arranged: Oxygen DME Agency: Patsy Lager Date DME Agency Contacted: 08/23/23 Time DME Agency Contacted: 1146 Representative spoke with at DME Agency: Kathie Rhodes  Social Drivers of Health (SDOH) Interventions SDOH Screenings   Food Insecurity: Patient Unable To Answer (08/21/2023)  Housing: Patient Unable To Answer (08/21/2023)  Transportation Needs: Patient Unable To Answer (08/21/2023)  Utilities: Patient Unable To Answer (08/21/2023)  Financial Resource Strain: Low Risk  (12/16/2022)   Received from [facility]  Physical Activity: Not on File (10/23/2021)   Received from [facility]  Social Connections: Not on File (04/01/2023)   Received from [facility]  Stress: Not on File (10/23/2021)   Received from [facility]  Tobacco Use: High Risk (08/20/2023)   Readmission Risk Interventions    08/22/2023   10:27 AM  Readmission Risk Prevention Plan  Post Dischage Appt Complete  Medication Screening Complete  Transportation Screening Complete

## 2023-08-23 NOTE — Care Management Important Message (Signed)
 Important Message  Patient Details IM Letter given. Name: Yvette Booth MRN: [id number] Date of Birth: [birth year]   Important Message Given:  Yes - Medicare IM     Caren Macadam 08/23/2023, 11:43 AM

## 2023-08-30 ENCOUNTER — Emergency Department (HOSPITAL_COMMUNITY): Payer: Medicare HMO

## 2023-08-30 ENCOUNTER — Inpatient Hospital Stay (HOSPITAL_COMMUNITY)
Admission: EM | Admit: 2023-08-30 | Discharge: 2023-09-01 | DRG: 189 | Disposition: A | Discharge status: Home / Self Care | Payer: Medicare HMO | Attending: Family Medicine | Admitting: Family Medicine

## 2023-08-30 ENCOUNTER — Encounter (HOSPITAL_COMMUNITY): Payer: Self-pay | Admitting: *Deleted

## 2023-08-30 ENCOUNTER — Other Ambulatory Visit: Payer: Self-pay

## 2023-08-30 DIAGNOSIS — Z91148 Patient's other noncompliance with medication regimen for other reason: Secondary | ICD-10-CM

## 2023-08-30 DIAGNOSIS — I480 Paroxysmal atrial fibrillation: Secondary | ICD-10-CM | POA: Diagnosis present

## 2023-08-30 DIAGNOSIS — Z86718 Personal history of other venous thrombosis and embolism: Secondary | ICD-10-CM

## 2023-08-30 DIAGNOSIS — R0689 Other abnormalities of breathing: Secondary | ICD-10-CM | POA: Diagnosis present

## 2023-08-30 DIAGNOSIS — E874 Mixed disorder of acid-base balance: Secondary | ICD-10-CM | POA: Diagnosis present

## 2023-08-30 DIAGNOSIS — E039 Hypothyroidism, unspecified: Secondary | ICD-10-CM | POA: Diagnosis present

## 2023-08-30 DIAGNOSIS — J441 Chronic obstructive pulmonary disease with (acute) exacerbation: Secondary | ICD-10-CM | POA: Diagnosis present

## 2023-08-30 DIAGNOSIS — G4733 Obstructive sleep apnea (adult) (pediatric): Secondary | ICD-10-CM | POA: Diagnosis present

## 2023-08-30 DIAGNOSIS — Z888 Allergy status to other drugs, medicaments and biological substances status: Secondary | ICD-10-CM | POA: Diagnosis not present

## 2023-08-30 DIAGNOSIS — I10 Essential (primary) hypertension: Secondary | ICD-10-CM | POA: Diagnosis present

## 2023-08-30 DIAGNOSIS — F17210 Nicotine dependence, cigarettes, uncomplicated: Secondary | ICD-10-CM | POA: Diagnosis present

## 2023-08-30 DIAGNOSIS — Z7901 Long term (current) use of anticoagulants: Secondary | ICD-10-CM | POA: Diagnosis not present

## 2023-08-30 DIAGNOSIS — J9621 Acute and chronic respiratory failure with hypoxia: Secondary | ICD-10-CM | POA: Diagnosis present

## 2023-08-30 DIAGNOSIS — Z79899 Other long term (current) drug therapy: Secondary | ICD-10-CM | POA: Diagnosis not present

## 2023-08-30 DIAGNOSIS — J9622 Acute and chronic respiratory failure with hypercapnia: Principal | ICD-10-CM | POA: Diagnosis present

## 2023-08-30 DIAGNOSIS — E785 Hyperlipidemia, unspecified: Secondary | ICD-10-CM | POA: Diagnosis present

## 2023-08-30 DIAGNOSIS — Z8673 Personal history of transient ischemic attack (TIA), and cerebral infarction without residual deficits: Secondary | ICD-10-CM

## 2023-08-30 DIAGNOSIS — Z7951 Long term (current) use of inhaled steroids: Secondary | ICD-10-CM | POA: Diagnosis not present

## 2023-08-30 DIAGNOSIS — Z1152 Encounter for screening for COVID-19: Secondary | ICD-10-CM | POA: Diagnosis not present

## 2023-08-30 LAB — CBC WITH DIFFERENTIAL/PLATELET
Abs Immature Granulocytes: 0.07 10*3/uL (ref 0.00–0.07)
Basophils Absolute: 0 10*3/uL (ref 0.0–0.1)
Basophils Relative: 0 %
Eosinophils Absolute: 0 10*3/uL (ref 0.0–0.5)
Eosinophils Relative: 0 %
HCT: 49.8 % — ABNORMAL HIGH (ref 36.0–46.0)
Hemoglobin: 14.3 g/dL (ref 12.0–15.0)
Immature Granulocytes: 1 %
Lymphocytes Relative: 14 %
Lymphs Abs: 1.6 10*3/uL (ref 0.7–4.0)
MCH: 28.5 pg (ref 26.0–34.0)
MCHC: 28.7 g/dL — ABNORMAL LOW (ref 30.0–36.0)
MCV: 99.2 fL (ref 80.0–100.0)
Monocytes Absolute: 0.3 10*3/uL (ref 0.1–1.0)
Monocytes Relative: 3 %
Neutro Abs: 9.7 10*3/uL — ABNORMAL HIGH (ref 1.7–7.7)
Neutrophils Relative %: 82 %
Platelets: 206 10*3/uL (ref 150–400)
RBC: 5.02 MIL/uL (ref 3.87–5.11)
RDW: 13.2 % (ref 11.5–15.5)
WBC: 11.8 10*3/uL — ABNORMAL HIGH (ref 4.0–10.5)
nRBC: 0 % (ref 0.0–0.2)

## 2023-08-30 LAB — RESP PANEL BY RT-PCR (RSV, FLU A&B, COVID)  RVPGX2
Influenza A by PCR: NEGATIVE
Influenza B by PCR: NEGATIVE
Resp Syncytial Virus by PCR: NEGATIVE
SARS Coronavirus 2 by RT PCR: NEGATIVE

## 2023-08-30 LAB — BASIC METABOLIC PANEL
Anion gap: 12 (ref 5–15)
BUN: 16 mg/dL (ref 8–23)
CO2: 44 mmol/L — ABNORMAL HIGH (ref 22–32)
Calcium: 8.4 mg/dL — ABNORMAL LOW (ref 8.9–10.3)
Chloride: 88 mmol/L — ABNORMAL LOW (ref 98–111)
Creatinine, Ser: 0.63 mg/dL (ref 0.44–1.00)
GFR, Estimated: >60 mL/min (ref >60)
Glucose, Bld: 155 mg/dL — ABNORMAL HIGH (ref 70–99)
Potassium: 3.7 mmol/L (ref 3.5–5.1)
Sodium: 144 mmol/L (ref 135–145)

## 2023-08-30 LAB — BLOOD GAS, ARTERIAL
Acid-Base Excess: 27.8 mmol/L — ABNORMAL HIGH (ref 0.0–2.0)
Bicarbonate: 62.3 mmol/L — ABNORMAL HIGH (ref 20.0–28.0)
Drawn by: 30136
O2 Saturation: 96.7 %
Patient temperature: 36.7
pCO2 arterial: 119 mmHg (ref 32–48)
pH, Arterial: 7.32 — ABNORMAL LOW (ref 7.35–7.45)
pO2, Arterial: 67 mmHg — ABNORMAL LOW (ref 83–108)

## 2023-08-30 LAB — BRAIN NATRIURETIC PEPTIDE: B Natriuretic Peptide: 64.7 pg/mL (ref 0.0–100.0)

## 2023-08-30 MED ORDER — UMECLIDINIUM BROMIDE 62.5 MCG/ACT IN AEPB
1.0000 | INHALATION_SPRAY | Freq: Every day | RESPIRATORY_TRACT | Status: DC
  Filled 2023-08-30: qty 7

## 2023-08-30 MED ORDER — FLUTICASONE FUROATE-VILANTEROL 100-25 MCG/ACT IN AEPB
1.0000 | INHALATION_SPRAY | Freq: Every day | RESPIRATORY_TRACT | Status: DC
  Administered 2023-08-31 – 2023-09-01 (×2): 1 via RESPIRATORY_TRACT
  Filled 2023-08-30 (×2): qty 28

## 2023-08-30 MED ORDER — ONDANSETRON HCL 4 MG/2ML IJ SOLN: 4.0000 mg | Freq: Four times a day (QID) | INTRAMUSCULAR | Status: DC | PRN

## 2023-08-30 MED ORDER — UMECLIDINIUM BROMIDE 62.5 MCG/ACT IN AEPB
1.0000 | INHALATION_SPRAY | Freq: Every day | RESPIRATORY_TRACT | Status: DC
  Administered 2023-08-31 – 2023-09-01 (×2): 1 via RESPIRATORY_TRACT
  Filled 2023-08-30: qty 7

## 2023-08-30 MED ORDER — IPRATROPIUM-ALBUTEROL 0.5-2.5 (3) MG/3ML IN SOLN
3.0000 mL | Freq: Once | RESPIRATORY_TRACT | Status: AC
  Administered 2023-08-30: 3 mL via RESPIRATORY_TRACT
  Filled 2023-08-30: qty 3

## 2023-08-30 MED ORDER — SODIUM CHLORIDE 0.9 % IV BOLUS
1000.0000 mL | Freq: Once | INTRAVENOUS | Status: AC
  Administered 2023-08-30: 1000 mL via INTRAVENOUS

## 2023-08-30 MED ORDER — IPRATROPIUM-ALBUTEROL 0.5-2.5 (3) MG/3ML IN SOLN: 3.0000 mL | RESPIRATORY_TRACT | Status: DC | PRN

## 2023-08-30 MED ORDER — SODIUM CHLORIDE 0.9 % IV SOLN
500.0000 mg | INTRAVENOUS | Status: DC
  Administered 2023-08-30 – 2023-08-31 (×2): 500 mg via INTRAVENOUS
  Filled 2023-08-30 (×3): qty 5

## 2023-08-30 MED ORDER — FLUTICASONE FUROATE-VILANTEROL 100-25 MCG/ACT IN AEPB
1.0000 | INHALATION_SPRAY | Freq: Every day | RESPIRATORY_TRACT | Status: DC
  Filled 2023-08-30: qty 28

## 2023-08-30 MED ORDER — SENNOSIDES-DOCUSATE SODIUM 8.6-50 MG PO TABS: 1.0000 | ORAL_TABLET | Freq: Every evening | ORAL | Status: DC | PRN

## 2023-08-30 MED ORDER — MAGNESIUM SULFATE 2 GM/50ML IV SOLN
2.0000 g | Freq: Once | INTRAVENOUS | Status: AC
  Administered 2023-08-30: 2 g via INTRAVENOUS
  Filled 2023-08-30: qty 50

## 2023-08-30 MED ORDER — PREDNISONE 50 MG PO TABS
60.0000 mg | ORAL_TABLET | Freq: Every day | ORAL | Status: DC
  Administered 2023-08-31 – 2023-09-01 (×2): 60 mg via ORAL
  Filled 2023-08-30 (×2): qty 1

## 2023-08-30 MED ORDER — APIXABAN 5 MG PO TABS
5.0000 mg | ORAL_TABLET | Freq: Two times a day (BID) | ORAL | Status: DC
  Administered 2023-08-31 – 2023-09-01 (×4): 5 mg via ORAL
  Filled 2023-08-30 (×4): qty 1

## 2023-08-30 MED ORDER — IPRATROPIUM-ALBUTEROL 0.5-2.5 (3) MG/3ML IN SOLN: 3.0000 mL | Freq: Four times a day (QID) | RESPIRATORY_TRACT | Status: DC

## 2023-08-30 MED ORDER — METOPROLOL SUCCINATE ER 50 MG PO TB24
50.0000 mg | ORAL_TABLET | Freq: Every day | ORAL | Status: DC
  Administered 2023-08-31 – 2023-09-01 (×2): 50 mg via ORAL
  Filled 2023-08-30 (×2): qty 1

## 2023-08-30 MED ORDER — ONDANSETRON HCL 4 MG PO TABS: 4.0000 mg | ORAL_TABLET | Freq: Four times a day (QID) | ORAL | Status: DC | PRN

## 2023-08-30 MED ORDER — ACETAMINOPHEN 650 MG RE SUPP: 650.0000 mg | Freq: Four times a day (QID) | RECTAL | Status: DC | PRN

## 2023-08-30 MED ORDER — METHIMAZOLE 10 MG PO TABS
20.0000 mg | ORAL_TABLET | Freq: Every day | ORAL | Status: DC
  Administered 2023-08-31: 20 mg via ORAL
  Filled 2023-08-30: qty 2

## 2023-08-30 MED ORDER — ACETAMINOPHEN 325 MG PO TABS: 650.0000 mg | ORAL_TABLET | Freq: Four times a day (QID) | ORAL | Status: DC | PRN

## 2023-08-30 MED ORDER — NICOTINE 21 MG/24HR TD PT24
21.0000 mg | MEDICATED_PATCH | Freq: Every day | TRANSDERMAL | Status: DC
  Administered 2023-08-31 – 2023-09-01 (×2): 21 mg via TRANSDERMAL
  Filled 2023-08-30 (×3): qty 1

## 2023-08-30 NOTE — ED Provider Notes (Addendum)
 [place] EMERGENCY DEPARTMENT AT [facility] Provider Note  CSN: [id number] Arrival date & time: 08/30/23 1255  Chief Complaint(s) Shortness of Breath  HPI Yvette Booth is a 63 y.o. female who is here today for shortness of breath.  Patient is history of COPD, on between 2 to 4 L at home, BiPAP at night.  Recently discharged.  Came in today for increasing work of breathing shortness of breath.  Patient still smokes.   Past Medical History Past Medical History:  Diagnosis Date   A-fib (HCC)    Chronic hypercapnic respiratory failure (HCC)    BIPAP at night   Chronic hypoxic respiratory failure (HCC)    COPD (chronic obstructive pulmonary disease) (HCC)    DVT (deep venous thrombosis) (HCC)    Hypertension    Hyperthyroidism    Ischemic stroke (HCC)    OSA (obstructive sleep apnea)    RVF (right ventricular failure) (HCC)    Patient Active Problem List   Diagnosis Date Noted   Hyperthyroidism 08/21/2023   PAF (paroxysmal atrial fibrillation) (HCC) 08/21/2023   OSA (obstructive sleep apnea) 08/21/2023   History of CVA (cerebrovascular accident) 08/21/2023   Hyperglycemia 08/21/2023   COPD exacerbation (HCC) 08/20/2023   Acute on chronic respiratory failure with hypoxia and hypercapnia (HCC) 08/20/2023   Acute on chronic respiratory failure with hypercapnia (HCC) 08/20/2023   Encounter for smoking cessation counseling 08/20/2023   Home Medication(s) Prior to Admission medications   Medication Sig Start Date End Date Taking? Authorizing Provider  albuterol (VENTOLIN HFA) 108 (90 Base) MCG/ACT inhaler Inhale 2 puffs into the lungs every 6 (six) hours as needed for wheezing or shortness of breath.    [provider]  apixaban (ELIQUIS) 5 MG TABS tablet Take 5 mg by mouth 2 (two) times daily.    [provider]  fluticasone-salmeterol (ADVAIR) 250-50 MCG/ACT AEPB Inhale 1 puff into the lungs in the morning and at bedtime.    [provider]   furosemide (LASIX) 40 MG tablet Take 40 mg by mouth daily.    [provider]  methimazole (TAPAZOLE) 10 MG tablet Take 20 mg by mouth daily.    [provider]  metoprolol succinate (TOPROL-XL) 50 MG 24 hr tablet Take 50 mg by mouth daily. Take with or immediately following a meal.    [provider]  umeclidinium bromide (INCRUSE ELLIPTA) 62.5 MCG/ACT AEPB Inhale 1 puff into the lungs daily.    [provider]                                                                                                                                    Past Surgical History History reviewed. No pertinent surgical history. Family History History reviewed. No pertinent family history.  Social History Social History   Tobacco Use   Smoking status: Every Day    Current packs/day: 1.00    Types: Cigarettes  Substance Use Topics   Alcohol use: Not Currently   Drug use: Never   Allergies Seroquel [quetiapine]  Review of Systems Review of Systems  Physical Exam Vital Signs  I have reviewed the triage vital signs BP (!) 141/62   Pulse 82   Resp (!) 25   Ht 5\' 3"  (1.6 m)   Wt 74.4 kg   SpO2 94%   BMI 29.06 kg/m   Physical Exam Vitals reviewed.  Constitutional:      General: She is in acute distress.     Appearance: She is ill-appearing. She is not toxic-appearing.  Cardiovascular:     Rate and Rhythm: Tachycardia present.  Pulmonary:     Effort: Tachypnea and accessory muscle usage present.     Breath sounds: Examination of the right-upper field reveals decreased breath sounds and wheezing. Examination of the left-upper field reveals decreased breath sounds and wheezing. Examination of the right-middle field reveals decreased breath sounds and wheezing. Examination of the left-middle field reveals decreased breath sounds and wheezing. Examination of the right-lower field reveals decreased breath sounds and wheezing. Examination of the left-lower field  reveals decreased breath sounds and wheezing. Decreased breath sounds and wheezing present.  Neurological:     Mental Status: She is alert and oriented to person, place, and time.     ED Results and Treatments Labs (all labs ordered are listed, but only abnormal results are displayed) Labs Reviewed  CBC WITH DIFFERENTIAL/PLATELET - Abnormal; Notable for the following components:      Result Value   WBC 11.8 (*)    HCT 49.8 (*)    MCHC 28.7 (*)    Neutro Abs 9.7 (*)    All other components within normal limits  BLOOD GAS, ARTERIAL - Abnormal; Notable for the following components:   pH, Arterial 7.32 (*)    pCO2 arterial 119 (*)    pO2, Arterial 67 (*)    Bicarbonate 62.3 (*)    Acid-Base Excess 27.8 (*)    All other components within normal limits  RESP PANEL BY RT-PCR (RSV, FLU A&B, COVID)  RVPGX2  BASIC METABOLIC PANEL  BRAIN NATRIURETIC PEPTIDE                                                                                                                          Radiology No results found.  Pertinent labs & imaging results that were available during my care of the patient were reviewed by me and considered in my medical decision making (see MDM for details).  Medications Ordered in ED Medications  ipratropium-albuterol (DUONEB) 0.5-2.5 (3) MG/3ML nebulizer solution 3 mL (3 mLs Nebulization Given 08/30/23 1433)  Procedures .Critical Care  Performed by: Arletha Pili, DO Authorized by: Arletha Pili, DO   Critical care provider statement:    Critical care time (minutes):  79   Critical care was necessary to treat or prevent imminent or life-threatening deterioration of the following conditions:  Respiratory failure   Critical care was time spent personally by me on the following activities:  Development of treatment plan with patient  or surrogate, discussions with consultants, evaluation of patient's response to treatment, examination of patient, ordering and review of laboratory studies, ordering and review of radiographic studies, ordering and performing treatments and interventions, pulse oximetry, re-evaluation of patient's condition and review of old charts   (including critical care time)  Medical Decision Making / ED Course   This patient presents to the ED for concern of shortness of breath, this involves an extensive number of treatment options, and is a complaint that carries with it a high risk of complications and morbidity.  The differential diagnosis includes COPD exacerbation, pneumothorax, less likely PE, pneumonia, viral syndrome  MDM: Patient with diffuse diminished air entry bilaterally with wheezing.  Believe is likely COPD exacerbation.  Patient currently oxygenating well, does appear a bit somnolent.  Will obtain an ABG on the patient, chest x-ray.  Will provide the patient with continued breathing treatments.  Reassessment 2:30 PM-patient's CO2 119.  Reassess patient's mental status, she is able to follow commands.  Was able to take her dentures out herself to be placed on BiPAP.  Patient still pending chest x-ray.  Reassessment 3 PM-patient's chest x-ray performed.  No pneumothorax, no pneumonia per my independent review.  Will admit patient to hospitalist for hypercapnia, hypoxia.  Patient doing well on BiPAP.   Additional history obtained: -Additional history obtained from EMS -External records from outside source obtained and reviewed including: Chart review including previous notes, labs, imaging, consultation notes   Lab Tests: -I ordered, reviewed, and interpreted labs.   The pertinent results include:   Labs Reviewed  CBC WITH DIFFERENTIAL/PLATELET - Abnormal; Notable for the following components:      Result Value   WBC 11.8 (*)    HCT 49.8 (*)    MCHC 28.7 (*)    Neutro Abs 9.7  (*)    All other components within normal limits  BLOOD GAS, ARTERIAL - Abnormal; Notable for the following components:   pH, Arterial 7.32 (*)    pCO2 arterial 119 (*)    pO2, Arterial 67 (*)    Bicarbonate 62.3 (*)    Acid-Base Excess 27.8 (*)    All other components within normal limits  RESP PANEL BY RT-PCR (RSV, FLU A&B, COVID)  RVPGX2  BASIC METABOLIC PANEL  BRAIN NATRIURETIC PEPTIDE      EKG sinus rhythm, no ST segment depression or elevation, no T wave inversions, no acute ischemia.  EKG Interpretation Date/Time:  Monday August 30 2023 13:21:53 EST Ventricular Rate:  68 PR Interval:  143 QRS Duration:  84 QT Interval:  409 QTC Calculation: 435 R Axis:   105  Text Interpretation: Sinus rhythm Right axis deviation Confirmed by Anders Simmonds [phone]) on 08/30/2023 2:37:46 PM          Medicines ordered and prescription drug management: Meds ordered this encounter  Medications   ipratropium-albuterol (DUONEB) 0.5-2.5 (3) MG/3ML nebulizer solution 3 mL    -I have reviewed the patients home medicines and have made adjustments as needed  Critical interventions Hypercapnia, hypoxia   Cardiac Monitoring: The patient was  maintained on a cardiac monitor.  I personally viewed and interpreted the cardiac monitored which showed an underlying rhythm of: Normal sinus rhythm  Social Determinants of Health:  Factors impacting patients care include: Tobacco use, poorly controlled COPD, medication noncompliance   Reevaluation: After the interventions noted above, I reevaluated the patient and found that they have :improved  Co morbidities that complicate the patient evaluation  Past Medical History:  Diagnosis Date   A-fib (HCC)    Chronic hypercapnic respiratory failure (HCC)    BIPAP at night   Chronic hypoxic respiratory failure (HCC)    COPD (chronic obstructive pulmonary disease) (HCC)    DVT (deep venous thrombosis) (HCC)    Hypertension    Hyperthyroidism     Ischemic stroke (HCC)    OSA (obstructive sleep apnea)    RVF (right ventricular failure) (HCC)       Dispostion: Admission    Final Clinical Impression(s) / ED Diagnoses Final diagnoses:  COPD exacerbation (HCC)  Hypercapnia     @PCDICTATION @    Anders Simmonds T, DO 08/30/23 1452    Anders Simmonds T, DO 08/30/23 1501

## 2023-08-30 NOTE — ED Notes (Signed)
 Daughter updated, she is asking that patient is not given seroquel

## 2023-08-30 NOTE — H&P (Addendum)
 History and Physical  Yvette Booth ZOX:[id number] DOB: 06/25/1961 DOA: 08/30/2023  PCP: [facility]   Chief Complaint:  Dyspnea  HPI: Yvette Booth is a 62 y.o. female with medical history significant for COPD, chronic hypoxic and hypercapnic respiratory failure on 2-4 L [place], OSA on BiPAP, 150-pack-year smoker, (still smoking 1 pack a day), RV failure, CVA, A-fib on Eliquis, DVT, HTN, hyperthyroidism, who presented to the ED for evaluation of dyspnea.  Patient somnolent and on BiPAP during my evaluation. She follows some commands but not consistently. Able to open eyes on command and takes deep breaths when asked to but does not answer any other questions.  En route, patient received 1 DuoNeb, IV Solu-Medrol 125 mg and 1 albuterol treatment by EMS.  ED Course: Initial vitals showed RR 25, HR 82, BP 143/59, SpO2 94% on 2 L.  Labs significant for mild leukocytosis of 11.8, unremarkable BMP, BNP 64.7, ABG with pH 7.32, pCO2 119, pO2 67 and bicarb 62.3, negative COVID, RSV and flu test.  EKG shows normal sinus. CXR with no active disease. Patient received another DuoNeb in the ED and placed on BiPAP. [facility] was consulted for admission  Review of Systems: Please see HPI for pertinent positives and negatives. A complete 10 system review of systems are otherwise negative.  Past Medical History:  Diagnosis Date   A-fib (HCC)    Chronic hypercapnic respiratory failure (HCC)    BIPAP at night   Chronic hypoxic respiratory failure (HCC)    COPD (chronic obstructive pulmonary disease) (HCC)    DVT (deep venous thrombosis) (HCC)    Hypertension    Hyperthyroidism    Ischemic stroke (HCC)    OSA (obstructive sleep apnea)    RVF (right ventricular failure) (HCC)    History reviewed. No pertinent surgical history. Social History:  reports that she has been smoking cigarettes. She does not have any smokeless tobacco history on file. She reports that she does not currently use  alcohol. She reports that she does not use drugs.  Allergies  Allergen Reactions   Seroquel [Quetiapine] Other (See Comments)    Delirium and confusion     History reviewed. No pertinent family history.   Prior to Admission medications   Medication Sig Start Date End Date Taking? Authorizing Provider  albuterol (VENTOLIN HFA) 108 (90 Base) MCG/ACT inhaler Inhale 2 puffs into the lungs every 6 (six) hours as needed for wheezing or shortness of breath.    [provider]  apixaban (ELIQUIS) 5 MG TABS tablet Take 5 mg by mouth 2 (two) times daily.    [provider]  fluticasone-salmeterol (ADVAIR) 250-50 MCG/ACT AEPB Inhale 1 puff into the lungs in the morning and at bedtime.    [provider]  furosemide (LASIX) 40 MG tablet Take 40 mg by mouth daily.    [provider]  methimazole (TAPAZOLE) 10 MG tablet Take 20 mg by mouth daily.    [provider]  metoprolol succinate (TOPROL-XL) 50 MG 24 hr tablet Take 50 mg by mouth daily. Take with or immediately following a meal.    [provider]  umeclidinium bromide (INCRUSE ELLIPTA) 62.5 MCG/ACT AEPB Inhale 1 puff into the lungs daily.    [provider]    Physical Exam: BP (!) 141/62   Pulse 79   Resp (!) 23   Ht 5\' 3"  (1.6 m)   Wt 74.4 kg   SpO2 (!) 88%   BMI 29.06 kg/m  General: Somnolent elderly woman on BiPAP laying in bed. No acute distress. HEENT: [place]/AT. Anicteric sclera. CV: RRR. No murmurs, rubs, or gallops. No LE edema Pulmonary: On BiPAP. Mild tachypnea. Diffuse wheezing throughout. Diminished lung sounds. No rales or rhonchi. Abdominal: Soft, nontender, nondistended. Normal bowel sounds. Extremities: Palpable radial and DP pulses. Normal ROM. Skin: Warm and dry. No obvious rash or lesions. Decreased skin turgor. Neuro: Somnolent, opens eyes on command. Does not follow commands consistently. Moves all extremities. Normal sensation to light touch. No focal  deficit. Psych: Normal mood and affect          Labs on Admission:  Basic Metabolic Panel: No results for input(s): "NA", "K", "CL", "CO2", "GLUCOSE", "BUN", "CREATININE", "CALCIUM", "MG", "PHOS" in the last 168 hours. Liver Function Tests: No results for input(s): "AST", "ALT", "ALKPHOS", "BILITOT", "PROT", "ALBUMIN" in the last 168 hours. No results for input(s): "LIPASE", "AMYLASE" in the last 168 hours. No results for input(s): "AMMONIA" in the last 168 hours. CBC: Recent Labs  Lab 08/30/23 1415  WBC 11.8*  NEUTROABS 9.7*  HGB 14.3  HCT 49.8*  MCV 99.2  PLT 206   Cardiac Enzymes: No results for input(s): "CKTOTAL", "CKMB", "CKMBINDEX", "TROPONINI" in the last 168 hours. BNP (last 3 results) Recent Labs    08/21/23 0317 08/30/23 1415  BNP 37.7 64.7    ProBNP (last 3 results) No results for input(s): "PROBNP" in the last 8760 hours.  CBG: No results for input(s): "GLUCAP" in the last 168 hours.  Radiological Exams on Admission: No results found. Assessment/Plan Yvette Booth is a 63 y.o. female with medical history significant for COPD, chronic hypoxic and hypercapnic respiratory failure on 2-4 L [place], OSA on BiPAP, 150-pack-year smoker, (still smoking 1 pack a day), RV failure, CVA, A-fib on Eliquis, DVT, HTN, hyperthyroidism, who presented to the ED for evaluation of dyspnea and admitted for COPD exacerbation and acute on chronic hypoxic and hypercarbic respiratory failure.  # COPD exacerbation # Acute on chronic hypoxic and hypercarbic respiratory failure Second presentation in the last 2 weeks for COPD exacerbation in the setting of continuous tobacco abuse. Patient hypoxic and hypercarbic on admission with decreased alertness in the ED. ABG shows significant hypercarbia with pH 7.32/pCO2 119, that is mostly compensated.  No abnormalities on CXR.  Negative RSV, COVID and flu test.  She remains somnolent on exam on BiPAP with diffuse wheezing and poor air movement  throughout her lungs. -Continue intermittent BiPAP therapy -Give IV mag 2 g x1 -Start IV azithromycin 500 mg x 5 days -Prednisone 60 mg daily, consider taper at discharge -Breo and Incruse Ellipta -As needed DuoNebs -Flutter valve, incentive spirometer -Continue supplemental O2 -Repeat VBG in the morning  #Hx of CVA # HLD -Continue atorvastatin and Eliquis  # A-fib EKG shows sinus rhythm. HR stable in the 70s to 80s. -Continue Eliquis and Toprol-XL  # OSA -BiPAP at bedtime  # Hypothyroidism -Continue methimazole -Follow-up repeat TSH  # Tobacco use disorder Continued tobacco abuse with approximately 150-pack-year history -Nicotine patch  DVT prophylaxis: Eliquis    Code Status: Full Code  Consults called: None  Family Communication: No family at bedside  Severity of Illness: The appropriate patient status for this patient is INPATIENT. Inpatient status is judged to be reasonable and necessary in order to provide the required intensity of service to ensure the patient's safety. The patient's presenting symptoms, physical exam findings, and initial radiographic and laboratory data in the context of their chronic comorbidities is felt to  place them at high risk for further clinical deterioration. Furthermore, it is not anticipated that the patient will be medically stable for discharge from the hospital within 2 midnights of admission.   * I certify that at the point of admission it is my clinical judgment that the patient will require inpatient hospital care spanning beyond 2 midnights from the point of admission due to high intensity of service, high risk for further deterioration and high frequency of surveillance required.*  Level of care:  Progressive  Steffanie Rainwater, MD 08/30/2023, 3:16 PM [facility] Pager: [phone] Isaiah 41:10   If 7PM-7AM, please contact night-coverage [email or web address] Password [email or web address]

## 2023-08-30 NOTE — ED Notes (Signed)
RT called to place pt on BiPAP

## 2023-08-30 NOTE — ED Triage Notes (Signed)
 BIB EMS due to shob, pt has COPD, on 02 2L at all times, pt wheezing and shob, EMS gave  1 duoneb #22 r fa, 125 mg Solumedrol given and 1 albuterol tx en route. Pt here for similar event on 08/20/23. 70-110/53-100% on tx, CBG 109

## 2023-08-31 ENCOUNTER — Inpatient Hospital Stay (HOSPITAL_COMMUNITY): Payer: Medicare HMO

## 2023-08-31 DIAGNOSIS — J441 Chronic obstructive pulmonary disease with (acute) exacerbation: Secondary | ICD-10-CM | POA: Diagnosis not present

## 2023-08-31 DIAGNOSIS — R0689 Other abnormalities of breathing: Secondary | ICD-10-CM

## 2023-08-31 LAB — COMPREHENSIVE METABOLIC PANEL
ALT: 9 U/L (ref 0–44)
AST: 14 U/L — ABNORMAL LOW (ref 15–41)
Albumin: 3.5 g/dL (ref 3.5–5.0)
Alkaline Phosphatase: 59 U/L (ref 38–126)
Anion gap: 11 (ref 5–15)
BUN: 21 mg/dL (ref 8–23)
CO2: 42 mmol/L — ABNORMAL HIGH (ref 22–32)
Calcium: 8.8 mg/dL — ABNORMAL LOW (ref 8.9–10.3)
Chloride: 87 mmol/L — ABNORMAL LOW (ref 98–111)
Creatinine, Ser: 0.49 mg/dL (ref 0.44–1.00)
GFR, Estimated: >60 mL/min (ref >60)
Glucose, Bld: 130 mg/dL — ABNORMAL HIGH (ref 70–99)
Potassium: 4.6 mmol/L (ref 3.5–5.1)
Sodium: 140 mmol/L (ref 135–145)
Total Bilirubin: 0.9 mg/dL (ref 0.0–1.2)
Total Protein: 6.3 g/dL — ABNORMAL LOW (ref 6.5–8.1)

## 2023-08-31 LAB — RESPIRATORY PANEL BY PCR

## 2023-08-31 LAB — BLOOD GAS, VENOUS
Acid-Base Excess: 25.9 mmol/L — ABNORMAL HIGH (ref 0.0–2.0)
Bicarbonate: 52.2 mmol/L — ABNORMAL HIGH (ref 20.0–28.0)
Drawn by: 6807
O2 Saturation: 84.3 %
Patient temperature: 37
pCO2, Ven: 57 mmHg (ref 44–60)
pH, Ven: 7.57 — ABNORMAL HIGH (ref 7.25–7.43)
pO2, Ven: 44 mmHg (ref 32–45)

## 2023-08-31 LAB — CBC
HCT: 46.5 % — ABNORMAL HIGH (ref 36.0–46.0)
Hemoglobin: 13.7 g/dL (ref 12.0–15.0)
MCH: 28.5 pg (ref 26.0–34.0)
MCHC: 29.5 g/dL — ABNORMAL LOW (ref 30.0–36.0)
MCV: 96.9 fL (ref 80.0–100.0)
Platelets: 206 10*3/uL (ref 150–400)
RBC: 4.8 MIL/uL (ref 3.87–5.11)
RDW: 12.9 % (ref 11.5–15.5)
WBC: 10.8 10*3/uL — ABNORMAL HIGH (ref 4.0–10.5)
nRBC: 0 % (ref 0.0–0.2)

## 2023-08-31 LAB — TSH: TSH: 4.116 u[IU]/mL (ref 0.350–4.500)

## 2023-08-31 MED ORDER — MONTELUKAST SODIUM 10 MG PO TABS
10.0000 mg | ORAL_TABLET | Freq: Every day | ORAL | Status: DC
  Administered 2023-08-31: 10 mg via ORAL
  Filled 2023-08-31: qty 1

## 2023-08-31 MED ORDER — IPRATROPIUM-ALBUTEROL 0.5-2.5 (3) MG/3ML IN SOLN
3.0000 mL | RESPIRATORY_TRACT | Status: DC | PRN
  Administered 2023-08-31: 3 mL via RESPIRATORY_TRACT

## 2023-08-31 MED ORDER — FUROSEMIDE 40 MG PO TABS
40.0000 mg | ORAL_TABLET | Freq: Every day | ORAL | Status: DC
Start: 2023-08-31 — End: 2023-09-01
  Administered 2023-08-31 – 2023-09-01 (×2): 40 mg via ORAL
  Filled 2023-08-31 (×2): qty 1

## 2023-08-31 MED ORDER — IPRATROPIUM-ALBUTEROL 0.5-2.5 (3) MG/3ML IN SOLN
3.0000 mL | Freq: Four times a day (QID) | RESPIRATORY_TRACT | Status: DC
  Administered 2023-08-31 – 2023-09-01 (×4): 3 mL via RESPIRATORY_TRACT
  Filled 2023-08-31 (×5): qty 3

## 2023-08-31 NOTE — Plan of Care (Signed)
  Problem: Education: Goal: Knowledge of disease or condition will improve Outcome: Progressing   Problem: Clinical Measurements: Goal: Ability to maintain clinical measurements within normal limits will improve Outcome: Progressing Goal: Will remain free from infection Outcome: Progressing Goal: Diagnostic test results will improve Outcome: Progressing

## 2023-08-31 NOTE — Progress Notes (Signed)
 Pt removed from BIPAP and placed on 3 lpm tolerating well at this time.

## 2023-08-31 NOTE — Progress Notes (Signed)
   08/31/23 2002  BiPAP/CPAP/SIPAP  Reason BIPAP/CPAP not in use Non-compliant   Pt found on 3l [place], mild increased wob noted.  HR83, RR 24-26, spo2 89% on 3L [place].  Pt encouraged to go back on bipap, but pt refused at this time.  Bipap remains in room on standby.

## 2023-08-31 NOTE — Plan of Care (Signed)
  Problem: Education: Goal: Knowledge of disease or condition will improve Outcome: Progressing Goal: Knowledge of the prescribed therapeutic regimen will improve Outcome: Progressing   Problem: Education: Goal: Knowledge of General Education information will improve Description: Including pain rating scale, medication(s)/side effects and non-pharmacologic comfort measures Outcome: Progressing   Problem: Clinical Measurements: Goal: Respiratory complications will improve Outcome: Progressing   Problem: Nutrition: Goal: Adequate nutrition will be maintained Outcome: Progressing

## 2023-08-31 NOTE — Progress Notes (Signed)
   08/31/23 2320  BiPAP/CPAP/SIPAP  $ Face Mask Medium Yes  BiPAP/CPAP/SIPAP Pt Type Adult  BiPAP/CPAP/SIPAP V60  Mask Type Full face mask  Dentures removed? Yes - Placed in denture cup  Mask Size Medium (changed to medium mask for better fit)  Set Rate 16 breaths/min  Respiratory Rate 32 breaths/min  IPAP 18 cmH20  EPAP 8 cmH2O  FiO2 (%) 40 %  Minute Ventilation 13.5  Leak 0  Peak Inspiratory Pressure (PIP) 19  Tidal Volume (Vt) 425  Patient Home Equipment No  Auto Titrate No  Press High Alarm 30 cmH2O  Press Low Alarm 5 cmH2O  CPAP/SIPAP surface wiped down Yes  BiPAP/CPAP /SiPAP Vitals  Pulse Rate 80  Resp (!) 32  SpO2 94 %  Bilateral Breath Sounds Expiratory wheezes

## 2023-08-31 NOTE — Progress Notes (Signed)
 PROGRESS NOTE    Yvette Booth  HYQ:[id number] DOB: 04/06/1961 DOA: 08/30/2023 PCP: [facility]   Brief Narrative:  Yvette Booth is a 63 y.o. female with medical history significant for COPD, chronic hypoxic and hypercapnic respiratory failure on 2L nasal cannula around-the-clock(occasionally uses up to 4 L inconsistently with exertion), OSA on BiPAP, 150-pack-year smoker, (still smoking 1 pack a day), RV failure, CVA, A-fib on Eliquis, history of DVT, HTN, hyperthyroidism, who presented to the ED for evaluation of dyspnea.  Patient initially required BiPAP to maintain respiratory status/normoxia.  Hospitalist called for admission.  Of note patient recently admitted chest 2 weeks ago for similar episode with marked improvement ultimately discharged back home.  Concern for ongoing tobacco abuse versus medication noncompliance.  Appears patient had completed a steroid taper and then progressively became more more dyspneic and hypoxic.  Patient planned to follow-up with new pulmonologist Dr. Kendrick Fries at [facility] but appears that she missed this appointment in the interim.  Recommend close follow-up in the next 1 to 2 weeks as scheduled.   Assessment & Plan:   Principal Problem:   COPD exacerbation (HCC) Active Problems:   Hypercapnia   Acute on chronic hypoxic and hypercarbic respiratory failure Acute COPD exacerbation OSA, on BiPAP Concurrent respiratory acidosis with compensated metabolic alkalosis -Recurrent episode of COPD exacerbation, most recently seen in our facility 2 weeks ago. -Continue tobacco abuse, concern for medication noncompliance complicating her clinical status -Patient initially required BiPAP, baseline 2 L nasal cannula(upwards of 4 L per patient) and BiPAP at night for sleep apnea -Profound hypercarbia and hypoxia noted on ABG mostly compensated indicating likely chronic hypercarbic state. -Viral panel negative (as it was 2 weeks ago) -Imaging  unremarkable for any overt opacity or pneumonia, CT chest pending to further evaluate patient's respiratory status -Continue supportive care, azithromycin, prednisone(just completed previous taper days before readmission), DuoNebs and scheduled nebs as well as home Breo/umeclidinium  Paroxysmal A-fib  Hx of CVA HLD -Continue atorvastatin, metoprolol, and Eliquis   OSA -BiPAP at bedtime   Hypothyroidism -Continue methimazole -Follow-up repeat TSH within normal limits   Tobacco use disorder Continued tobacco abuse with approximately 150-pack-year history -Nicotine patch offered - discussed cessation at length with patient  DVT prophylaxis:  apixaban (ELIQUIS) tablet 5 mg   Code Status:   Code Status: Full Code  Family Communication: None present  Status is: Inpt  Dispo: The patient is from: Home              Anticipated d/c is to: Home              Anticipated d/c date is: 24-48h              Patient currently NOT medically stable for discharge  Consultants:  None  Procedures:  None  Antimicrobials:  Azithromycin   Subjective: No acute issues or events overnight tolerating BiPAP well, now back to nasal cannula with ongoing wheeze but improved respiratory status.  Denies nausea vomiting diarrhea constipation headache fevers chills or chest pain  Objective: Vitals:   08/30/23 2251 08/31/23 0021 08/31/23 0221 08/31/23 0503  BP:  135/88  (!) 148/75  Pulse:  83 64 65  Resp:  20 20 18   Temp: 97.9 F (36.6 C) 97.8 F (36.6 C)  97.7 F (36.5 C)  TempSrc: Axillary Oral  Oral  SpO2:  97% 98% 100%  Weight:      Height:        Intake/Output Summary (Last  24 hours) at 08/31/2023 0706 Last data filed at 08/30/2023 2146 Gross per 24 hour  Intake 1300 ml  Output --  Net 1300 ml   Filed Weights   08/30/23 1306  Weight: 74.4 kg    Examination:  General:  Pleasantly resting in bed, No acute distress. HEENT:  Normocephalic atraumatic.  Sclerae nonicteric,  noninjected.  Extraocular movements intact bilaterally. Neck:  Without mass or deformity.  Trachea is midline. Lungs: Bilateral wheeze, right greater than left without overt rales or rhonchi Heart:  Regular rate and rhythm.  Without murmurs, rubs, or gallops. Abdomen:  Soft, nontender, nondistended.  Without guarding or rebound. Extremities: Without cyanosis, clubbing, edema, or obvious deformity. Skin:  Warm and dry, no erythema.  Data Reviewed: I have personally reviewed following labs and imaging studies  CBC: Recent Labs  Lab 08/30/23 1415  WBC 11.8*  NEUTROABS 9.7*  HGB 14.3  HCT 49.8*  MCV 99.2  PLT 206   Basic Metabolic Panel: Recent Labs  Lab 08/30/23 1415  NA 144  K 3.7  CL 88*  CO2 44*  GLUCOSE 155*  BUN 16  CREATININE 0.63  CALCIUM 8.4*   GFR: Estimated Creatinine Clearance: 69.5 mL/min (by C-G formula based on SCr of 0.63 mg/dL). Liver Function Tests: No results for input(s): "AST", "ALT", "ALKPHOS", "BILITOT", "PROT", "ALBUMIN" in the last 168 hours. No results for input(s): "LIPASE", "AMYLASE" in the last 168 hours. No results for input(s): "AMMONIA" in the last 168 hours. Coagulation Profile: No results for input(s): "INR", "PROTIME" in the last 168 hours. Cardiac Enzymes: No results for input(s): "CKTOTAL", "CKMB", "CKMBINDEX", "TROPONINI" in the last 168 hours. BNP (last 3 results) No results for input(s): "PROBNP" in the last 8760 hours. HbA1C: No results for input(s): "HGBA1C" in the last 72 hours. CBG: No results for input(s): "GLUCAP" in the last 168 hours. Lipid Profile: No results for input(s): "CHOL", "HDL", "LDLCALC", "TRIG", "CHOLHDL", "LDLDIRECT" in the last 72 hours. Thyroid Function Tests: No results for input(s): "TSH", "T4TOTAL", "FREET4", "T3FREE", "THYROIDAB" in the last 72 hours. Anemia Panel: No results for input(s): "VITAMINB12", "FOLATE", "FERRITIN", "TIBC", "IRON", "RETICCTPCT" in the last 72 hours. Sepsis Labs: No results  for input(s): "PROCALCITON", "LATICACIDVEN" in the last 168 hours.  Recent Results (from the past 240 hours)  Resp panel by RT-PCR (RSV, Flu A&B, Covid) Anterior Nasal Swab     Status: None   Collection Time: 08/30/23  1:24 PM   Specimen: Anterior Nasal Swab  Result Value Ref Range Status   SARS Coronavirus 2 by RT PCR NEGATIVE NEGATIVE Final    Comment: (NOTE) SARS-CoV-2 target nucleic acids are NOT DETECTED.  The SARS-CoV-2 RNA is generally detectable in upper respiratory specimens during the acute phase of infection. The lowest concentration of SARS-CoV-2 viral copies this assay can detect is 138 copies/mL. A negative result does not preclude SARS-Cov-2 infection and should not be used as the sole basis for treatment or other patient management decisions. A negative result may occur with  improper specimen collection/handling, submission of specimen other than nasopharyngeal swab, presence of viral mutation(s) within the areas targeted by this assay, and inadequate number of viral copies(<138 copies/mL). A negative result must be combined with clinical observations, patient history, and epidemiological information. The expected result is Negative.  Fact Sheet for Patients:  BloggerCourse.com  Fact Sheet for Healthcare Providers:  SeriousBroker.it  This test is no t yet approved or cleared by the [place] FDA and  has been authorized for detection and/or diagnosis  of SARS-CoV-2 by FDA under an Emergency Use Authorization (EUA). This EUA will remain  in effect (meaning this test can be used) for the duration of the COVID-19 declaration under Section 564(b)(1) of the Act, 21 U.S.C.section 360bbb-3(b)(1), unless the authorization is terminated  or revoked sooner.       Influenza A by PCR NEGATIVE NEGATIVE Final   Influenza B by PCR NEGATIVE NEGATIVE Final    Comment: (NOTE) The Xpert Xpress SARS-CoV-2/FLU/RSV plus assay  is intended as an aid in the diagnosis of influenza from Nasopharyngeal swab specimens and should not be used as a sole basis for treatment. Nasal washings and aspirates are unacceptable for Xpert Xpress SARS-CoV-2/FLU/RSV testing.  Fact Sheet for Patients: BloggerCourse.com  Fact Sheet for Healthcare Providers: SeriousBroker.it  This test is not yet approved or cleared by the [place] FDA and has been authorized for detection and/or diagnosis of SARS-CoV-2 by FDA under an Emergency Use Authorization (EUA). This EUA will remain in effect (meaning this test can be used) for the duration of the COVID-19 declaration under Section 564(b)(1) of the Act, 21 U.S.C. section 360bbb-3(b)(1), unless the authorization is terminated or revoked.     Resp Syncytial Virus by PCR NEGATIVE NEGATIVE Final    Comment: (NOTE) Fact Sheet for Patients: BloggerCourse.com  Fact Sheet for Healthcare Providers: SeriousBroker.it  This test is not yet approved or cleared by the [place] FDA and has been authorized for detection and/or diagnosis of SARS-CoV-2 by FDA under an Emergency Use Authorization (EUA). This EUA will remain in effect (meaning this test can be used) for the duration of the COVID-19 declaration under Section 564(b)(1) of the Act, 21 U.S.C. section 360bbb-3(b)(1), unless the authorization is terminated or revoked.  Performed at [facility], [address] [place], [place] [address]     Radiology Studies: [facility] Chest Port 1 View Result Date: 08/30/2023 CLINICAL DATA:  Cough. EXAM: PORTABLE CHEST 1 VIEW COMPARISON:  Chest CT dated 03/21/2023. FINDINGS: Background of emphysema. No focal consolidation, pleural effusion, or pneumothorax. The cardiac silhouette is within normal limits. No acute osseous pathology. IMPRESSION: 1. No active disease. 2. Emphysema.  Electronically Signed   By: Elgie Collard M.D.   On: 08/30/2023 16:15   Scheduled Meds:  apixaban  5 mg Oral BID   fluticasone furoate-vilanterol  1 puff Inhalation Daily   methimazole  20 mg Oral Daily   metoprolol succinate  50 mg Oral Daily   nicotine  21 mg Transdermal Daily   predniSONE  60 mg Oral Q breakfast   umeclidinium bromide  1 puff Inhalation Daily   Continuous Infusions:  azithromycin Stopped (08/30/23 2146)     LOS: 1 day   Time spent:  Azucena Fallen, DO [facility]  If 7PM-7AM, please contact night-coverage [email or web address]  08/31/2023, 7:06 AM

## 2023-08-31 NOTE — Progress Notes (Signed)
 Pt transported from ED to 4W 1436 without complications.

## 2023-09-01 DIAGNOSIS — R0689 Other abnormalities of breathing: Secondary | ICD-10-CM

## 2023-09-01 DIAGNOSIS — J441 Chronic obstructive pulmonary disease with (acute) exacerbation: Secondary | ICD-10-CM | POA: Diagnosis not present

## 2023-09-01 MED ORDER — FLUTICASONE-SALMETEROL 250-50 MCG/ACT IN AEPB: 1.0000 | INHALATION_SPRAY | Freq: Two times a day (BID) | RESPIRATORY_TRACT | 3 refills | Status: DC

## 2023-09-01 MED ORDER — PREDNISONE 10 MG PO TABS: ORAL_TABLET | ORAL | 0 refills | Status: DC

## 2023-09-01 MED ORDER — AZITHROMYCIN 500 MG PO TABS: 500.0000 mg | ORAL_TABLET | Freq: Every day | ORAL | 0 refills | Status: AC

## 2023-09-01 MED ORDER — INCRUSE ELLIPTA 62.5 MCG/ACT IN AEPB: 1.0000 | INHALATION_SPRAY | Freq: Every day | RESPIRATORY_TRACT | 3 refills | Status: DC

## 2023-09-01 NOTE — TOC CM/SW Note (Addendum)
 Transition of Care [facility]) - Inpatient Brief Assessment   Patient Details  Name: Yvette Booth MRN: [id number] Date of Birth: [birth year]  Transition of Care [facility]) CM/SW Contact:    Larrie Kass, LCSW Phone Number: 09/01/2023, 9:46 AM   Clinical Narrative:  Noted pt on oxygen at baseline, may need portable tank upon d/c home. TOC will follow.  ADDEN  Pt is requesting a portable tank. CSW contacted Morrie Sheldon with [employer], and the tank will be delivered to the pt's room prior to d/c. Pt denies need for transportation. TOC sign off.   Transition of Care Asessment: Insurance and Status: Insurance coverage has been reviewed Patient has primary care physician: Yes Home environment has been reviewed: home with self Prior level of function:: independent Prior/Current Home Services: Current home services (home O2) Social Drivers of Health Review: SDOH reviewed no interventions necessary Readmission risk has been reviewed: Yes Transition of care needs: transition of care needs identified, TOC will continue to follow

## 2023-09-01 NOTE — Progress Notes (Signed)
 SATURATION QUALIFICATIONS: (This note is used to comply with regulatory documentation for home oxygen)  Patient saturations on 3 Liters of oxygen at rest=95%  Patient Saturations on 3 Liters of oxygen while Ambulating = 92%  Please briefly explain why patient needs home oxygen: Patient is baseline 3L o2 @home . Patient able to ambulate unit on 3L with o2 sats=92%

## 2023-09-01 NOTE — Discharge Summary (Signed)
 Physician Discharge Summary   Patient: Yvette Booth MRN: [id number] DOB: 03-31-1961  Admit date:     08/30/2023  Discharge date: 09/01/23  Discharge Physician: Meredeth Ide   PCP: [facility]   Recommendations at discharge:   Follow-up PCP in 1 week Follow-up with pulmonologist as outpatient Follow-up for lung nodule with pulmonologist as outpatient  Discharge Diagnoses: Principal Problem:   COPD exacerbation (HCC) Active Problems:   Hypercapnia  Resolved Problems:   * No resolved hospital problems. *  Hospital Course:  63 y.o. female with medical history significant for COPD, chronic hypoxic and hypercapnic respiratory failure on 2L nasal cannula around-the-clock(occasionally uses up to 4 L inconsistently with exertion), OSA on BiPAP, 150-pack-year smoker, (still smoking 1 pack a day), RV failure, CVA, A-fib on Eliquis, history of DVT, HTN, hyperthyroidism, who presented to the ED for evaluation of dyspnea.  Patient initially required BiPAP to maintain respiratory status/normoxia.  Hospitalist called for admission.   Of note patient recently admitted chest 2 weeks ago for similar episode with marked improvement ultimately discharged back home.  Concern for ongoing tobacco abuse versus medication noncompliance.  Appears patient had completed a steroid taper and then progressively became more more dyspneic and hypoxic.  Patient planned to follow-up with new pulmonologist Dr. Kendrick Fries at [facility] but appears that she missed this appointment in the interim.  Recommend close follow-up in the next 1 to 2 weeks as scheduled.    Assessment and Plan:  Acute on chronic hypoxic and hypercarbic respiratory failure Acute COPD exacerbation OSA, on BiPAP Concurrent respiratory acidosis with compensated metabolic alkalosis -Recurrent episode of COPD exacerbation, most recently seen in our facility 2 weeks ago. -Continue tobacco abuse, concern for medication noncompliance  complicating her clinical status -Patient initially required BiPAP, baseline 2 L nasal cannula(upwards of 4 L per patient) and BiPAP at night for sleep apnea -Profound hypercarbia and hypoxia noted on ABG mostly compensated indicating likely chronic hypercarbic state. -Viral panel negative (as it was 2 weeks ago) -Imaging unremarkable for any overt opacity or pneumonia, CT chest did not show acute abnormality except for 3 mm lung nodule.   -Will follow-up with pulmonology as outpatient F -Continue supportive care, azithromycin, prednisone taper for next 4 days.   Continue home inhalers including Incruse Ellipta, Advair   Paroxysmal A-fib  Hx of CVA HLD -Continue atorvastatin, metoprolol, and Eliquis   OSA -BiPAP at bedtime   Hypothyroidism -Continue methimazole -Follow-up repeat TSH within normal limits   Tobacco use disorder Continued tobacco abuse with approximately 150-pack-year history -Nicotine patch offered - discussed cessation at length with patient         Consultants:  Procedures performed:  Disposition: Home Diet recommendation:  Discharge Diet Orders (From admission, onward)     Start     Ordered   09/01/23 0000  Diet - low sodium heart healthy        09/01/23 1347           Regular diet DISCHARGE MEDICATION: Allergies as of 09/01/2023       Reactions   Seroquel [quetiapine] Other (See Comments)   Delirium and confusion         Medication List     TAKE these medications    albuterol 108 (90 Base) MCG/ACT inhaler Commonly known as: VENTOLIN HFA Inhale 2 puffs into the lungs every 6 (six) hours as needed for wheezing or shortness of breath.   azithromycin 500 MG tablet Commonly known as: Zithromax Take  1 tablet (500 mg total) by mouth daily for 2 days. Start taking on: September 02, 2023   Eliquis 5 MG Tabs tablet Generic drug: apixaban Take 5 mg by mouth 2 (two) times daily.   fluticasone-salmeterol 250-50 MCG/ACT Aepb Commonly  known as: ADVAIR Inhale 1 puff into the lungs in the morning and at bedtime.   furosemide 40 MG tablet Commonly known as: LASIX Take 40 mg by mouth daily.   Incruse Ellipta 62.5 MCG/ACT Aepb Generic drug: umeclidinium bromide Inhale 1 puff into the lungs daily.   methimazole 10 MG tablet Commonly known as: TAPAZOLE Take 20 mg by mouth daily.   metoprolol succinate 50 MG 24 hr tablet Commonly known as: TOPROL-XL Take 50 mg by mouth daily. Take with or immediately following a meal.   predniSONE 10 MG tablet Commonly known as: DELTASONE Prednisone 40 mg po daily x 1 day then Prednisone 30 mg po daily x 1 day then Prednisone 20 mg po daily x 1 day then Prednisone 10 mg daily x 1 day then stop... Start taking on: September 02, 2023        Follow-up Information     [facility] Follow up in 1 week(s).   Specialty: Pediatrics Contact information: [address] [place] [address] [phone]         Lupita Leash, MD. Schedule an appointment as soon as possible for a visit.   Specialty: Pulmonary Disease Contact information: [address] [place] [address] [place] [address] [phone]                Discharge Exam: Ceasar Mons Weights   08/30/23 1306  Weight: 74.4 kg   General-appears in no acute distress Heart-S1-S2, regular, no murmur auscultated Lungs-clear to auscultation bilaterally, no wheezing or crackles auscultated Abdomen-soft, nontender, no organomegaly Extremities-no edema in the lower extremities Neuro-alert, oriented x3, no focal deficit noted  Condition at discharge: good  The results of significant diagnostics from this hospitalization (including imaging, microbiology, ancillary and laboratory) are listed below for reference.   Imaging Studies: CT CHEST WO CONTRAST Result Date: 08/31/2023 CLINICAL DATA:  COPD exacerbation. EXAM: CT CHEST WITHOUT CONTRAST TECHNIQUE: Multidetector CT imaging of the  chest was performed following the standard protocol without IV contrast. RADIATION DOSE REDUCTION: This exam was performed according to the departmental dose-optimization program which includes automated exposure control, adjustment of the mA and/or kV according to patient size and/or use of iterative reconstruction technique. COMPARISON:  Chest radiograph dated 08/30/2023. CT dated 03/21/2023. FINDINGS: Evaluation of this exam is limited in the absence of intravenous contrast. Cardiovascular: There is no cardiomegaly or pericardial effusion. Two vessel coronary vascular calcification. Mild atherosclerotic calcification of the thoracic aorta. No aneurysmal dilatation. The central pulmonary arteries are grossly unremarkable. Mediastinum/Nodes: No hilar or mediastinal adenopathy. The esophagus is grossly unremarkable. Enlarged and multinodular thyroid gland similar to prior CT, likely a multinodular goiter. Recommend thyroid US (ref: J Am Coll Radiol. 2015 Feb;12(2): 143-50).No mediastinal fluid collection. Lungs/Pleura: Severe emphysema. No focal consolidation, pleural effusion, pneumothorax. Small calcified granuloma in the left upper lobe. A 3 mm right lower lobe nodule similar to prior CT. The central airways are patent. Upper Abdomen: No acute abnormality. Musculoskeletal: No acute osseous pathology. IMPRESSION: 1. No acute intrathoracic pathology. 2. A 3 mm right lower lobe nodule. No follow-up needed if patient is low-risk.This recommendation follows the consensus statement: Guidelines for Management of Incidental Pulmonary Nodules Detected on CT Images: From the Fleischner  Society 2017; Radiology 2017; 284:228-243. 3. Aortic Atherosclerosis (ICD10-I70.0) and Emphysema (ICD10-J43.9). Electronically Signed   By: Elgie Collard M.D.   On: 08/31/2023 13:38   DG Chest Port 1 View Result Date: 08/30/2023 CLINICAL DATA:  Cough. EXAM: PORTABLE CHEST 1 VIEW COMPARISON:  Chest CT dated 03/21/2023. FINDINGS:  Background of emphysema. No focal consolidation, pleural effusion, or pneumothorax. The cardiac silhouette is within normal limits. No acute osseous pathology. IMPRESSION: 1. No active disease. 2. Emphysema. Electronically Signed   By: Elgie Collard M.D.   On: 08/30/2023 16:15   DG Chest Portable 1 View Result Date: 08/20/2023 CLINICAL DATA:  Dyspnea EXAM: PORTABLE CHEST 1 VIEW COMPARISON:  04/23/2023 FINDINGS: Lungs are symmetrically well inflated. Paucity of vasculature in the lung apices is in keeping with underlying emphysema. No confluent pulmonary infiltrate. No pneumothorax or pleural effusion. Cardiac size within normal limits. Pulmonary vascularity is otherwise normal. No acute bone abnormality. IMPRESSION: 1. Emphysema. No active disease. Electronically Signed   By: Helyn Numbers M.D.   On: 08/20/2023 20:46    Microbiology: Results for orders placed or performed during the hospital encounter of 08/30/23  Resp panel by RT-PCR (RSV, Flu A&B, Covid) Anterior Nasal Swab     Status: None   Collection Time: 08/30/23  1:24 PM   Specimen: Anterior Nasal Swab  Result Value Ref Range Status   SARS Coronavirus 2 by RT PCR NEGATIVE NEGATIVE Final    Comment: (NOTE) SARS-CoV-2 target nucleic acids are NOT DETECTED.  The SARS-CoV-2 RNA is generally detectable in upper respiratory specimens during the acute phase of infection. The lowest concentration of SARS-CoV-2 viral copies this assay can detect is 138 copies/mL. A negative result does not preclude SARS-Cov-2 infection and should not be used as the sole basis for treatment or other patient management decisions. A negative result may occur with  improper specimen collection/handling, submission of specimen other than nasopharyngeal swab, presence of viral mutation(s) within the areas targeted by this assay, and inadequate number of viral copies(<138 copies/mL). A negative result must be combined with clinical observations, patient  history, and epidemiological information. The expected result is Negative.  Fact Sheet for Patients:  BloggerCourse.com  Fact Sheet for Healthcare Providers:  SeriousBroker.it  This test is no t yet approved or cleared by the [place] FDA and  has been authorized for detection and/or diagnosis of SARS-CoV-2 by FDA under an Emergency Use Authorization (EUA). This EUA will remain  in effect (meaning this test can be used) for the duration of the COVID-19 declaration under Section 564(b)(1) of the Act, 21 U.S.C.section 360bbb-3(b)(1), unless the authorization is terminated  or revoked sooner.       Influenza A by PCR NEGATIVE NEGATIVE Final   Influenza B by PCR NEGATIVE NEGATIVE Final    Comment: (NOTE) The Xpert Xpress SARS-CoV-2/FLU/RSV plus assay is intended as an aid in the diagnosis of influenza from Nasopharyngeal swab specimens and should not be used as a sole basis for treatment. Nasal washings and aspirates are unacceptable for Xpert Xpress SARS-CoV-2/FLU/RSV testing.  Fact Sheet for Patients: BloggerCourse.com  Fact Sheet for Healthcare Providers: SeriousBroker.it  This test is not yet approved or cleared by the [place] FDA and has been authorized for detection and/or diagnosis of SARS-CoV-2 by FDA under an Emergency Use Authorization (EUA). This EUA will remain in effect (meaning this test can be used) for the duration of the COVID-19 declaration under Section 564(b)(1) of the Act, 21 U.S.C. section 360bbb-3(b)(1), unless the authorization is  terminated or revoked.     Resp Syncytial Virus by PCR NEGATIVE NEGATIVE Final    Comment: (NOTE) Fact Sheet for Patients: BloggerCourse.com  Fact Sheet for Healthcare Providers: SeriousBroker.it  This test is not yet approved or cleared by the [place] FDA  and has been authorized for detection and/or diagnosis of SARS-CoV-2 by FDA under an Emergency Use Authorization (EUA). This EUA will remain in effect (meaning this test can be used) for the duration of the COVID-19 declaration under Section 564(b)(1) of the Act, 21 U.S.C. section 360bbb-3(b)(1), unless the authorization is terminated or revoked.  Performed at [facility], [address], [place], [place] [address]   Respiratory (~20 pathogens) panel by PCR     Status: None   Collection Time: 08/30/23  1:24 PM   Specimen: Nasopharyngeal Swab; Respiratory  Result Value Ref Range Status   Adenovirus NOT DETECTED NOT DETECTED Final   Coronavirus 229E NOT DETECTED NOT DETECTED Final    Comment: (NOTE) The Coronavirus on the Respiratory Panel, DOES NOT test for the novel  Coronavirus (2019 nCoV)    Coronavirus HKU1 NOT DETECTED NOT DETECTED Final   Coronavirus NL63 NOT DETECTED NOT DETECTED Final   Coronavirus OC43 NOT DETECTED NOT DETECTED Final   Metapneumovirus NOT DETECTED NOT DETECTED Final   Rhinovirus / Enterovirus NOT DETECTED NOT DETECTED Final   Influenza A NOT DETECTED NOT DETECTED Final   Influenza B NOT DETECTED NOT DETECTED Final   Parainfluenza Virus 1 NOT DETECTED NOT DETECTED Final   Parainfluenza Virus 2 NOT DETECTED NOT DETECTED Final   Parainfluenza Virus 3 NOT DETECTED NOT DETECTED Final   Parainfluenza Virus 4 NOT DETECTED NOT DETECTED Final   Respiratory Syncytial Virus NOT DETECTED NOT DETECTED Final   Bordetella pertussis NOT DETECTED NOT DETECTED Final   Bordetella Parapertussis NOT DETECTED NOT DETECTED Final   Chlamydophila pneumoniae NOT DETECTED NOT DETECTED Final   Mycoplasma pneumoniae NOT DETECTED NOT DETECTED Final    Comment: Performed at [facility], [address], [place], [place] [address]    Labs: CBC: Recent Labs  Lab 08/30/23 1415 08/31/23 0647  WBC 11.8* 10.8*  NEUTROABS 9.7*  --   HGB 14.3 13.7  HCT  49.8* 46.5*  MCV 99.2 96.9  PLT 206 206   Basic Metabolic Panel: Recent Labs  Lab 08/30/23 1415 08/31/23 0647  NA 144 140  K 3.7 4.6  CL 88* 87*  CO2 44* 42*  GLUCOSE 155* 130*  BUN 16 21  CREATININE 0.63 0.49  CALCIUM 8.4* 8.8*   Liver Function Tests: Recent Labs  Lab 08/31/23 0647  AST 14*  ALT 9  ALKPHOS 59  BILITOT 0.9  PROT 6.3*  ALBUMIN 3.5   CBG: No results for input(s): "GLUCAP" in the last 168 hours.  Discharge time spent: greater than 30 minutes.  Signed: Meredeth Ide, MD [facility] 09/01/2023

## 2023-09-01 NOTE — Progress Notes (Addendum)
   09/01/23 0253  BiPAP/CPAP/SIPAP  Reason BIPAP/CPAP not in use Non-compliant  BiPAP/CPAP /SiPAP Vitals  SpO2 94 %  Bilateral Breath Sounds Expiratory wheezes   Pt seen during rounding and found off bipap.  Pt stated it was too much and that she wanted it off.  Pt found on 3l [place], HR73, RR22-24, spo2 94%.  Pt stated she does not want to wear bipap any more tonight.  Bipap remains in room on standby.

## 2023-10-15 ENCOUNTER — Encounter (HOSPITAL_COMMUNITY): Payer: Self-pay

## 2023-10-15 ENCOUNTER — Inpatient Hospital Stay (HOSPITAL_COMMUNITY)
Admission: EM | Admit: 2023-10-15 | Discharge: 2023-10-18 | DRG: 189 | Disposition: A | Discharge status: Home / Self Care | Attending: Family Medicine | Admitting: Family Medicine

## 2023-10-15 ENCOUNTER — Other Ambulatory Visit: Payer: Self-pay

## 2023-10-15 ENCOUNTER — Emergency Department (HOSPITAL_COMMUNITY)

## 2023-10-15 DIAGNOSIS — J441 Chronic obstructive pulmonary disease with (acute) exacerbation: Principal | ICD-10-CM | POA: Diagnosis present

## 2023-10-15 DIAGNOSIS — I480 Paroxysmal atrial fibrillation: Secondary | ICD-10-CM | POA: Diagnosis present

## 2023-10-15 DIAGNOSIS — Z7951 Long term (current) use of inhaled steroids: Secondary | ICD-10-CM

## 2023-10-15 DIAGNOSIS — J9621 Acute and chronic respiratory failure with hypoxia: Secondary | ICD-10-CM | POA: Diagnosis present

## 2023-10-15 DIAGNOSIS — F17210 Nicotine dependence, cigarettes, uncomplicated: Secondary | ICD-10-CM | POA: Diagnosis present

## 2023-10-15 DIAGNOSIS — I10 Essential (primary) hypertension: Secondary | ICD-10-CM | POA: Diagnosis present

## 2023-10-15 DIAGNOSIS — Z86718 Personal history of other venous thrombosis and embolism: Secondary | ICD-10-CM

## 2023-10-15 DIAGNOSIS — J439 Emphysema, unspecified: Secondary | ICD-10-CM | POA: Diagnosis present

## 2023-10-15 DIAGNOSIS — Z716 Tobacco abuse counseling: Secondary | ICD-10-CM | POA: Diagnosis not present

## 2023-10-15 DIAGNOSIS — Z7901 Long term (current) use of anticoagulants: Secondary | ICD-10-CM

## 2023-10-15 DIAGNOSIS — Z888 Allergy status to other drugs, medicaments and biological substances status: Secondary | ICD-10-CM

## 2023-10-15 DIAGNOSIS — G4733 Obstructive sleep apnea (adult) (pediatric): Secondary | ICD-10-CM | POA: Diagnosis present

## 2023-10-15 DIAGNOSIS — J9622 Acute and chronic respiratory failure with hypercapnia: Principal | ICD-10-CM | POA: Diagnosis present

## 2023-10-15 DIAGNOSIS — Z79899 Other long term (current) drug therapy: Secondary | ICD-10-CM

## 2023-10-15 DIAGNOSIS — Z8673 Personal history of transient ischemic attack (TIA), and cerebral infarction without residual deficits: Secondary | ICD-10-CM | POA: Diagnosis not present

## 2023-10-15 LAB — CBC WITH DIFFERENTIAL/PLATELET
Abs Immature Granulocytes: 0.02 10*3/uL (ref 0.00–0.07)
Basophils Absolute: 0 10*3/uL (ref 0.0–0.1)
Basophils Relative: 0 %
Eosinophils Absolute: 0.1 10*3/uL (ref 0.0–0.5)
Eosinophils Relative: 1 %
HCT: 52 % — ABNORMAL HIGH (ref 36.0–46.0)
Hemoglobin: 15.5 g/dL — ABNORMAL HIGH (ref 12.0–15.0)
Immature Granulocytes: 0 %
Lymphocytes Relative: 34 %
Lymphs Abs: 3 10*3/uL (ref 0.7–4.0)
MCH: 28.5 pg (ref 26.0–34.0)
MCHC: 29.8 g/dL — ABNORMAL LOW (ref 30.0–36.0)
MCV: 95.8 fL (ref 80.0–100.0)
Monocytes Absolute: 0.6 10*3/uL (ref 0.1–1.0)
Monocytes Relative: 7 %
Neutro Abs: 5.1 10*3/uL (ref 1.7–7.7)
Neutrophils Relative %: 58 %
Platelets: 256 10*3/uL (ref 150–400)
RBC: 5.43 MIL/uL — ABNORMAL HIGH (ref 3.87–5.11)
RDW: 13.4 % (ref 11.5–15.5)
WBC: 8.9 10*3/uL (ref 4.0–10.5)
nRBC: 0 % (ref 0.0–0.2)

## 2023-10-15 LAB — BLOOD GAS, VENOUS
Acid-Base Excess: 18.7 mmol/L — ABNORMAL HIGH (ref 0.0–2.0)
Acid-Base Excess: 15.6 mmol/L — ABNORMAL HIGH (ref 0.0–2.0)
Bicarbonate: 52.3 mmol/L — ABNORMAL HIGH (ref 20.0–28.0)
Bicarbonate: 47.7 mmol/L — ABNORMAL HIGH (ref 20.0–28.0)
O2 Saturation: 75.8 %
O2 Saturation: 76.9 %
Patient temperature: 37
Patient temperature: 36.8
pCO2, Ven: 122 mmHg (ref 44–60)
pCO2, Ven: 96 mmHg (ref 44–60)
pH, Ven: 7.24 — ABNORMAL LOW (ref 7.25–7.43)
pH, Ven: 7.3 (ref 7.25–7.43)
pO2, Ven: 41 mmHg (ref 32–45)
pO2, Ven: 39 mmHg (ref 32–45)

## 2023-10-15 LAB — BASIC METABOLIC PANEL WITH GFR
Anion gap: 7 (ref 5–15)
BUN: 14 mg/dL (ref 8–23)
CO2: 43 mmol/L — ABNORMAL HIGH (ref 22–32)
Calcium: 9.4 mg/dL (ref 8.9–10.3)
Chloride: 93 mmol/L — ABNORMAL LOW (ref 98–111)
Creatinine, Ser: 0.68 mg/dL (ref 0.44–1.00)
GFR, Estimated: >60 mL/min (ref >60)
Glucose, Bld: 142 mg/dL — ABNORMAL HIGH (ref 70–99)
Potassium: 4.7 mmol/L (ref 3.5–5.1)
Sodium: 143 mmol/L (ref 135–145)

## 2023-10-15 LAB — MRSA NEXT GEN BY PCR, NASAL: MRSA by PCR Next Gen: DETECTED — AB

## 2023-10-15 LAB — RESP PANEL BY RT-PCR (RSV, FLU A&B, COVID)  RVPGX2
Influenza A by PCR: NEGATIVE
Influenza B by PCR: NEGATIVE
Resp Syncytial Virus by PCR: NEGATIVE
SARS Coronavirus 2 by RT PCR: NEGATIVE

## 2023-10-15 MED ORDER — ONDANSETRON HCL 4 MG/2ML IJ SOLN: 4.0000 mg | Freq: Four times a day (QID) | INTRAMUSCULAR | Status: DC | PRN

## 2023-10-15 MED ORDER — LEVALBUTEROL HCL 1.25 MG/0.5ML IN NEBU
1.2500 mg | INHALATION_SOLUTION | Freq: Once | RESPIRATORY_TRACT | Status: AC
  Administered 2023-10-15: 1.25 mg via RESPIRATORY_TRACT
  Filled 2023-10-15: qty 0.5

## 2023-10-15 MED ORDER — POLYETHYLENE GLYCOL 3350 17 G PO PACK: 17.0000 g | PACK | Freq: Every day | ORAL | Status: DC | PRN

## 2023-10-15 MED ORDER — FLUTICASONE FUROATE-VILANTEROL 200-25 MCG/ACT IN AEPB
1.0000 | INHALATION_SPRAY | Freq: Every day | RESPIRATORY_TRACT | Status: DC
  Administered 2023-10-15 – 2023-10-18 (×4): 1 via RESPIRATORY_TRACT
  Filled 2023-10-15: qty 28

## 2023-10-15 MED ORDER — METOPROLOL SUCCINATE ER 50 MG PO TB24
50.0000 mg | ORAL_TABLET | Freq: Every day | ORAL | Status: DC
  Administered 2023-10-15 – 2023-10-18 (×4): 50 mg via ORAL
  Filled 2023-10-15: qty 1
  Filled 2023-10-15 (×3): qty 2

## 2023-10-15 MED ORDER — PREDNISONE 20 MG PO TABS
40.0000 mg | ORAL_TABLET | Freq: Every day | ORAL | Status: DC
  Administered 2023-10-16: 40 mg via ORAL
  Filled 2023-10-15: qty 2

## 2023-10-15 MED ORDER — UMECLIDINIUM BROMIDE 62.5 MCG/ACT IN AEPB: 1.0000 | INHALATION_SPRAY | Freq: Every day | RESPIRATORY_TRACT | Status: DC

## 2023-10-15 MED ORDER — METHYLPREDNISOLONE SODIUM SUCC 125 MG IJ SOLR
60.0000 mg | Freq: Two times a day (BID) | INTRAMUSCULAR | Status: DC
Start: 2023-10-15 — End: 2023-10-15
  Administered 2023-10-15: 60 mg via INTRAVENOUS
  Filled 2023-10-15: qty 2

## 2023-10-15 MED ORDER — IPRATROPIUM-ALBUTEROL 0.5-2.5 (3) MG/3ML IN SOLN
3.0000 mL | Freq: Four times a day (QID) | RESPIRATORY_TRACT | Status: DC
  Administered 2023-10-15 – 2023-10-18 (×13): 3 mL via RESPIRATORY_TRACT
  Filled 2023-10-15 (×14): qty 3

## 2023-10-15 MED ORDER — ALBUTEROL SULFATE (2.5 MG/3ML) 0.083% IN NEBU: 2.5000 mg | INHALATION_SOLUTION | RESPIRATORY_TRACT | Status: DC | PRN

## 2023-10-15 MED ORDER — PHENOL 1.4 % MT LIQD
1.0000 | OROMUCOSAL | Status: DC | PRN
Start: 2023-10-15 — End: 2023-10-18
  Filled 2023-10-15: qty 177

## 2023-10-15 MED ORDER — APIXABAN 5 MG PO TABS
5.0000 mg | ORAL_TABLET | Freq: Two times a day (BID) | ORAL | Status: DC
  Administered 2023-10-15 – 2023-10-18 (×7): 5 mg via ORAL
  Filled 2023-10-15 (×7): qty 1

## 2023-10-15 MED ORDER — NICOTINE POLACRILEX 2 MG MT GUM: 2.0000 mg | CHEWING_GUM | OROMUCOSAL | Status: DC | PRN

## 2023-10-15 MED ORDER — SODIUM CHLORIDE 0.9% FLUSH
3.0000 mL | Freq: Two times a day (BID) | INTRAVENOUS | Status: DC
  Administered 2023-10-15 – 2023-10-17 (×6): 3 mL via INTRAVENOUS

## 2023-10-15 MED ORDER — METHIMAZOLE 10 MG PO TABS
20.0000 mg | ORAL_TABLET | Freq: Every day | ORAL | Status: DC
  Administered 2023-10-15 – 2023-10-18 (×4): 20 mg via ORAL
  Filled 2023-10-15 (×4): qty 2

## 2023-10-15 MED ORDER — LEVOFLOXACIN 500 MG PO TABS
500.0000 mg | ORAL_TABLET | Freq: Every day | ORAL | Status: DC
  Administered 2023-10-15 – 2023-10-18 (×4): 500 mg via ORAL
  Filled 2023-10-15 (×4): qty 1

## 2023-10-15 MED ORDER — CHLORHEXIDINE GLUCONATE CLOTH 2 % EX PADS
6.0000 | MEDICATED_PAD | Freq: Every day | CUTANEOUS | Status: DC
  Administered 2023-10-15 – 2023-10-17 (×3): 6 via TOPICAL

## 2023-10-15 MED ORDER — ACETAMINOPHEN 500 MG PO TABS: 1000.0000 mg | ORAL_TABLET | Freq: Four times a day (QID) | ORAL | Status: DC | PRN

## 2023-10-15 MED ORDER — NICOTINE 14 MG/24HR TD PT24
14.0000 mg | MEDICATED_PATCH | Freq: Every day | TRANSDERMAL | Status: DC
  Administered 2023-10-15 – 2023-10-18 (×4): 14 mg via TRANSDERMAL
  Filled 2023-10-15 (×4): qty 1

## 2023-10-15 MED ORDER — MELATONIN 3 MG PO TABS: 6.0000 mg | ORAL_TABLET | Freq: Every evening | ORAL | Status: DC | PRN

## 2023-10-15 NOTE — ED Provider Notes (Signed)
 [place] EMERGENCY DEPARTMENT AT [facility] Provider Note  CSN: [id number] Arrival date & time: 10/15/23 4401  Chief Complaint(s) Shortness of Breath  HPI Yvette Booth is a 63 y.o. female with a past medical history listed below including COPD on 2 L nasal cannula, prior DVTs and paroxysmal A-fib on Eliquis here for 2 to 3 days of gradually worsening shortness of breath that became severe this evening.  Patient endorsing cough with no sputum.  No associated chest pain.  No lower extremity edema.  No fevers.  Patient reports that she still smokes.  Has 150 pack year smoking history.  Brought in by EMS who noted patient had diminished lung sounds. Sating 92% on her home O2. She received 3 DuoNeb's, 2 g of magnesium and Solu-Medrol.  She was placed on CPAP.  Reports "some improvement."  The history is provided by the patient.    Past Medical History Past Medical History:  Diagnosis Date   A-fib (HCC)    Chronic hypercapnic respiratory failure (HCC)    BIPAP at night   Chronic hypoxic respiratory failure (HCC)    COPD (chronic obstructive pulmonary disease) (HCC)    DVT (deep venous thrombosis) (HCC)    Hypertension    Hyperthyroidism    Ischemic stroke (HCC)    OSA (obstructive sleep apnea)    RVF (right ventricular failure) [facility])    Patient Active Problem List   Diagnosis Date Noted   Hypercapnia 08/31/2023   Hyperthyroidism 08/21/2023   PAF (paroxysmal atrial fibrillation) (HCC) 08/21/2023   OSA (obstructive sleep apnea) 08/21/2023   History of CVA (cerebrovascular accident) 08/21/2023   Hyperglycemia 08/21/2023   COPD exacerbation (HCC) 08/20/2023   Acute on chronic respiratory failure with hypoxia and hypercapnia (HCC) 08/20/2023   Acute on chronic respiratory failure with hypercapnia (HCC) 08/20/2023   Encounter for smoking cessation counseling 08/20/2023   Home Medication(s) Prior to Admission medications   Medication Sig Start Date End Date Taking?  Authorizing Provider  albuterol (VENTOLIN HFA) 108 (90 Base) MCG/ACT inhaler Inhale 2 puffs into the lungs every 6 (six) hours as needed for wheezing or shortness of breath.    [provider]  apixaban (ELIQUIS) 5 MG TABS tablet Take 5 mg by mouth 2 (two) times daily.    [provider]  fluticasone-salmeterol (ADVAIR) 250-50 MCG/ACT AEPB Inhale 1 puff into the lungs in the morning and at bedtime. 09/01/23   Meredeth Ide, MD  furosemide (LASIX) 40 MG tablet Take 40 mg by mouth daily.    [provider]  methimazole (TAPAZOLE) 10 MG tablet Take 20 mg by mouth daily.    [provider]  metoprolol succinate (TOPROL-XL) 50 MG 24 hr tablet Take 50 mg by mouth daily. Take with or immediately following a meal.    [provider]  predniSONE (DELTASONE) 10 MG tablet Prednisone 40 mg po daily x 1 day then Prednisone 30 mg po daily x 1 day then Prednisone 20 mg po daily x 1 day then Prednisone 10 mg daily x 1 day then stop... 09/02/23   Meredeth Ide, MD  umeclidinium bromide (INCRUSE ELLIPTA) 62.5 MCG/ACT AEPB Inhale 1 puff into the lungs daily. 09/01/23   Meredeth Ide, MD  Allergies Seroquel [quetiapine]  Review of Systems Review of Systems As noted in HPI  Physical Exam Vital Signs  I have reviewed the triage vital signs BP 121/66   Pulse 83   Temp 97.6 F (36.4 C) (Axillary)   Resp (!) 29   Ht 5\' 3"  (1.6 m)   Wt 74 kg   SpO2 95%   BMI 28.90 kg/m    Physical Exam Vitals reviewed.  Constitutional:      General: She is not in acute distress.    Appearance: She is well-developed. She is not diaphoretic.  HENT:     Head: Normocephalic and atraumatic.     Nose: Nose normal.  Eyes:     General: No scleral icterus.       Right eye: No discharge.        Left eye: No discharge.     Conjunctiva/sclera: Conjunctivae  normal.     Pupils: Pupils are equal, round, and reactive to light.  Cardiovascular:     Rate and Rhythm: Normal rate and regular rhythm.     Heart sounds: No murmur heard.    No friction rub. No gallop.  Pulmonary:     Effort: Tachypnea, prolonged expiration and respiratory distress present.     Breath sounds: No stridor. Wheezing (coarse expiratory) and rhonchi present.  Abdominal:     General: There is no distension.     Palpations: Abdomen is soft.     Tenderness: There is no abdominal tenderness.  Musculoskeletal:        General: No tenderness.     Cervical back: Normal range of motion and neck supple.  Skin:    General: Skin is warm and dry.     Findings: No erythema or rash.  Neurological:     Mental Status: She is alert and oriented to person, place, and time.     ED Results and Treatments Labs (all labs ordered are listed, but only abnormal results are displayed) Labs Reviewed  BASIC METABOLIC PANEL WITH GFR - Abnormal; Notable for the following components:      Result Value   Chloride 93 (*)    CO2 43 (*)    Glucose, Bld 142 (*)    All other components within normal limits  CBC WITH DIFFERENTIAL/PLATELET - Abnormal; Notable for the following components:   RBC 5.43 (*)    Hemoglobin 15.5 (*)    HCT 52.0 (*)    MCHC 29.8 (*)    All other components within normal limits  BLOOD GAS, VENOUS - Abnormal; Notable for the following components:   pH, Ven 7.24 (*)    pCO2, Ven 122 (*)    Bicarbonate 52.3 (*)    Acid-Base Excess 18.7 (*)    All other components within normal limits  RESP PANEL BY RT-PCR (RSV, FLU A&B, COVID)  RVPGX2  I-STAT CHEM 8, ED  EKG  EKG Interpretation Date/Time:  Friday October 15 2023 03:42:11 EDT Ventricular Rate:  83 PR Interval:    QRS Duration:  81 QT Interval:  374 QTC Calculation: 440 R Axis:   119  Text  Interpretation: Sinus rhythm Anteroseptal infarct, age indeterminate Confirmed by Drema Pry [phone]) on 10/15/2023 4:05:26 AM       Radiology DG Chest Port 1 View Result Date: 10/15/2023 CLINICAL DATA:  Shortness of breath EXAM: PORTABLE CHEST 1 VIEW COMPARISON:  08/30/2023, CT 08/31/2023 FINDINGS: Emphysema. No acute airspace disease, pleural effusion or pneumothorax. Normal cardiomediastinal silhouette. IMPRESSION: No active disease. Emphysema. Electronically Signed   By: Jasmine Pang M.D.   On: 10/15/2023 03:59    Medications Ordered in ED Medications  levalbuterol (XOPENEX) nebulizer solution 1.25 mg (has no administration in time range)   Procedures .Critical Care  Performed by: Nira Conn, MD Authorized by: Nira Conn, MD   Critical care provider statement:    Critical care time (minutes):  34   Critical care time was exclusive of:  Separately billable procedures and treating other patients   Critical care was necessary to treat or prevent imminent or life-threatening deterioration of the following conditions:  Respiratory failure   Critical care was time spent personally by me on the following activities:  Development of treatment plan with patient or surrogate, discussions with consultants, evaluation of patient's response to treatment, examination of patient, obtaining history from patient or surrogate, review of old charts, re-evaluation of patient's condition, pulse oximetry, ordering and review of radiographic studies, ordering and review of laboratory studies and ordering and performing treatments and interventions   Care discussed with: admitting provider     (including critical care time) Medical Decision Making / ED Course   Medical Decision Making Amount and/or Complexity of Data Reviewed Labs: ordered. Decision-making details documented in ED Course. Radiology: ordered and independent interpretation performed. Decision-making details  documented in ED Course. ECG/medicine tests: ordered and independent interpretation performed. Decision-making details documented in ED Course.  Risk Prescription drug management. Decision regarding hospitalization.    Patient presents in respiratory distress. Workup is consistent with COPD exacerbation with acute on chronic hypercarbic respiratory failure.  Patient is requiring BiPAP.  Additional inline breathing treatments provided.  She has already received IV Solu-Medrol and magnesium.  CBC without leukocytosis or anemia.  Metabolic panel without significant electrolyte derangements or renal sufficiency. Chest x-ray without evidence of pneumonia, pneumothorax, pulmonary edema or pleural effusions.  EKG without acute ischemic changes, dysrhythmias.   Consulting hospitalist service for admission to the stepdown unit.  Final Clinical Impression(s) / ED Diagnoses Final diagnoses:  COPD exacerbation (HCC)  Acute on chronic respiratory failure with hypercapnia (HCC)    This chart was dictated using voice recognition software.  Despite best efforts to proofread,  errors can occur which can change the documentation meaning.    Nira Conn, MD 10/15/23 [phone]

## 2023-10-15 NOTE — Hospital Course (Signed)
 HPI: Yvette Booth is a 63 y.o. 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  Reports over the past 2 days daughter has noted she has been more somnolent than usual.  Over the same time she has become increasingly dyspneic unable to do any activity.  Still on home O2 2 L.  Reports new cough with white sputum.  Reports still smoking about 1/4 to 1/2 pack a day, however feels worse after smoking and this is led to reduction in ppd.  She is interested in pursuing further smoking cessation.  Notably confused about inhalers and has multiple extra's, not sure what she is supposed be using on a daily basis.   Significant Events: Admitted 10/15/2023 copd exacerbation   Significant Labs: WBC 8.9, HgB 15.5, plt 256 ABG pH 7.25, PCO2 122, PO2 41 Na 143, K 4.7, CO2 of 42, BUN 14, scr 0.68, glu 142 Covid/flu/rsv Negative  Significant Imaging Studies: CXR No active disease. Emphysema   Antibiotic Therapy: Anti-infectives (From admission, onward)    Start     Dose/Rate Route Frequency Ordered Stop   10/15/23 1000  levofloxacin (LEVAQUIN) tablet 500 mg        500 mg Oral Daily 10/15/23 0521 10/20/23 0959       Procedures:   Consultants:

## 2023-10-15 NOTE — Progress Notes (Signed)
   10/15/23 1127  BiPAP/CPAP/SIPAP  Reason BIPAP/CPAP not in use (S)  Other(comment) (placed pt on 2 L [place], bipap now is on standby. SP02 98%.)

## 2023-10-15 NOTE — Assessment & Plan Note (Signed)
 10-15-2023 her baseline pCO2 was somewhere between 80 and 90 mmHg.  Repeat VBG this morning showed PCO2 down to 96.  pH is increased to 7.3.  Continue with BiPAP.  Given her severe CO2 retention, targeting pulse oximetry between 88 and 92%.

## 2023-10-15 NOTE — Assessment & Plan Note (Signed)
 10-15-2023 in NSR on admission. On Eliquis bid.

## 2023-10-15 NOTE — Subjective & Objective (Signed)
 Patient seen and examined.  On BiPAP.  Comfortable.  Slight wheezing on BiPAP.  Sleeping comfortably.

## 2023-10-15 NOTE — TOC Initial Note (Signed)
 Transition of Care [facility]) - Initial/Assessment Note    Patient Details  Name: Yvette Booth MRN: [id number] Date of Birth: 01/18/1961  [facility]) CM/SW Contact:    Jessie Foot, RN Phone Number: 10/15/2023, 4:56 PM  Clinical Narrative:                 Presented for SOB, increased fatigue/generalized weakness from home; lives with adult daughter in an apartment; verified PCP and insurance; DME- oxygen at 2 liter continuous ([facility]) and BiPAP ([facility], [facility]); patient's daughter drive her to appointments and will transport home at discharge via private vehicle. TOC will follow for progression to discharge for oxygen needs.  Expected Discharge Plan: Home/Self Care Barriers to Discharge: Continued Medical Work up   Patient Goals and CMS Choice Patient states their goals for this hospitalization and ongoing recovery are:: Home   Choice offered to / list presented to : NA      Expected Discharge Plan and Services   Discharge Planning Services: NA Post Acute Care Choice: NA Living arrangements for the past 2 months: Apartment                 DME Arranged: N/A                    Prior Living Arrangements/Services Living arrangements for the past 2 months: Apartment Lives with:: Adult Children Patient language and need for interpreter reviewed:: Yes Do you feel safe going back to the place where you live?: Yes      Need for Family Participation in Patient Care: No (Comment) Care giver support system in place?: Yes (comment) Current home services: DME (oxygen [facility])    Activities of Daily Living   ADL Screening (condition at time of admission) Independently performs ADLs?: Yes (appropriate for developmental age) Is the patient deaf or have difficulty hearing?: No Does the patient have difficulty seeing, even when wearing glasses/contacts?: No Does the patient have difficulty concentrating, remembering, or making decisions?: No  Permission  Sought/Granted Permission sought to share information with : Case Manager Permission granted to share information with : Yes, Verbal Permission Granted     Permission granted to share info w AGENCY: [facility]        Emotional Assessment Appearance:: Appears stated age Attitude/Demeanor/Rapport: Engaged Affect (typically observed): Accepting, Appropriate Orientation: : Oriented to Self, Oriented to Place, Oriented to  Time, Oriented to Situation Alcohol / Substance Use: Not Applicable Psych Involvement: No (comment)  Admission diagnosis:  COPD exacerbation (HCC) [J44.1] Acute on chronic respiratory failure with hypercapnia (HCC) [J96.22] Patient Active Problem List   Diagnosis Date Noted   Hyperthyroidism 08/21/2023   PAF (paroxysmal atrial fibrillation) (HCC) 08/21/2023   OSA (obstructive sleep apnea) 08/21/2023   History of CVA (cerebrovascular accident) 08/21/2023   COPD exacerbation (HCC) 08/20/2023   Acute on chronic respiratory failure with hypoxia and hypercapnia (HCC) 08/20/2023   Acute on chronic respiratory failure with hypercapnia (HCC) 08/20/2023   Encounter for smoking cessation counseling 08/20/2023   PCP:  [facility] Pharm[facility]:   [id number] - [place], [place] - [address] [place] [address] Phone: [phone] Fax: [phone]  [facility], [place] - [address] [place] [address] Phone: [phone] Fax: [phone]  [place] [facility] [address] [place] [address] Phone: [phone] Fax: [phone]     Social Drivers of Health (  SDOH) Social History: SDOH Screenings   Food Insecurity: Patient Declined (10/15/2023)  Housing: Patient Declined (10/15/2023)  Transportation Needs: Patient Declined (10/15/2023)  Utilities: Patient Declined (10/15/2023)  Financial Resource Strain: Low Risk  (12/16/2022)   Received  from [facility]  Physical Activity: Not on File (10/23/2021)   Received from [facility]  Social Connections: Not on File (04/01/2023)   Received from [facility]  Stress: Not on File (10/23/2021)   Received from [facility]  Tobacco Use: High Risk (10/15/2023)   SDOH Interventions:     Readmission Risk Interventions    08/22/2023   10:27 AM  Readmission Risk Prevention Plan  Post Dischage Appt Complete  Medication Screening Complete  Transportation Screening Complete

## 2023-10-15 NOTE — Progress Notes (Signed)
   10/15/23 2327  BiPAP/CPAP/SIPAP  BiPAP/CPAP/SIPAP Pt Type Adult  BiPAP/CPAP/SIPAP SERVO  Mask Type Full face mask  Dentures removed? Yes - Placed in denture cup  Mask Size Medium  IPAP 14 cmH20  EPAP 6 cmH2O  FiO2 (%) 40 %  Minute Ventilation 8.5  Peak Inspiratory Pressure (PIP) 14  Patient Home Machine No  Patient Home Mask No  Patient Home Tubing No  Press High Alarm 25 cmH2O  CPAP/SIPAP surface wiped down Yes  Device Plugged into RED Power Outlet Yes  BiPAP/CPAP /SiPAP Vitals  Temp 97.6 F (36.4 C)  Pulse Rate 80 (Simultaneous filing. User may not have seen previous data.)  Resp (!) 23 (Simultaneous filing. User may not have seen previous data.)  BP (!) 180/83 (Simultaneous filing. User may not have seen previous data.)  SpO2 93 % (Simultaneous filing. User may not have seen previous data.)  MEWS Score/Color  MEWS Score 1  MEWS Score Color Green

## 2023-10-15 NOTE — ED Triage Notes (Signed)
 Pt BIBA from home with family stating she has been having increased fatigue/generalized weakness and less responsive. Pt endorses SOB but denies CP, lightheadedness,and dizziness.   80-106/56-99%CPAP  Neb Tx x3 2g Mag 125mg  Solu-medrol

## 2023-10-15 NOTE — Assessment & Plan Note (Signed)
 10-15-2023 pt needs to stop smoking.

## 2023-10-15 NOTE — H&P (Signed)
 History and Physical    Naimah Yingst ZOX:[id number] DOB: 1960/07/28 DOA: 10/15/2023  PCP: [facility]   Patient coming from: Home   Chief Complaint:  Chief Complaint  Patient presents with   Shortness of Breath    HPI:  Yvette Booth is a 63 y.o. 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  Reports over the past 2 days daughter has noted she has been more somnolent than usual.  Over the same time she has become increasingly dyspneic unable to do any activity.  Still on home O2 2 L.  Reports new cough with white sputum.  Reports still smoking about 1/4 to 1/2 pack a day, however feels worse after smoking and this is led to reduction in ppd.  She is interested in pursuing further smoking cessation.  Notably confused about inhalers and has multiple extra's, not sure what she is supposed be using on a daily basis.   Review of Systems:  ROS complete and negative except as marked above   Allergies  Allergen Reactions   Seroquel [Quetiapine] Other (See Comments)    Delirium and confusion     Prior to Admission medications   Medication Sig Start Date End Date Taking? Authorizing Provider  albuterol (VENTOLIN HFA) 108 (90 Base) MCG/ACT inhaler Inhale 2 puffs into the lungs every 6 (six) hours as needed for wheezing or shortness of breath.    [provider]  apixaban (ELIQUIS) 5 MG TABS tablet Take 5 mg by mouth 2 (two) times daily.    [provider]  fluticasone-salmeterol (ADVAIR) 250-50 MCG/ACT AEPB Inhale 1 puff into the lungs in the morning and at bedtime. 09/01/23   Meredeth Ide, MD  furosemide (LASIX) 40 MG tablet Take 40 mg by mouth daily.    [provider]  methimazole (TAPAZOLE) 10 MG tablet Take 20 mg by mouth daily.     [provider]  metoprolol succinate (TOPROL-XL) 50 MG 24 hr tablet Take 50 mg by mouth daily. Take with or immediately following a meal.    [provider]  predniSONE (DELTASONE) 10 MG tablet Prednisone 40 mg po daily x 1 day then Prednisone 30 mg po daily x 1 day then Prednisone 20 mg po daily x 1 day then Prednisone 10 mg daily x 1 day then stop... 09/02/23   Meredeth Ide, MD  umeclidinium bromide (INCRUSE ELLIPTA) 62.5 MCG/ACT AEPB Inhale 1 puff into the lungs daily. 09/01/23   Meredeth Ide, MD    Past Medical History:  Diagnosis Date   A-fib [facility])    Chronic hypercapnic respiratory failure (HCC)    BIPAP at night   Chronic hypoxic respiratory failure (HCC)    COPD (chronic obstructive pulmonary disease) (HCC)    DVT (deep venous thrombosis) (HCC)    Hypertension    Hyperthyroidism    Ischemic stroke (HCC)    OSA (obstructive sleep apnea)    RVF (right ventricular failure) (HCC)     History reviewed. No pertinent surgical history.   reports that she has been smoking cigarettes. She does not have any smokeless tobacco history on file. She reports that she does not currently use alcohol. She reports that she does not use drugs.  History reviewed. No pertinent family history.   Physical Exam: Vitals:  10/15/23 0438 10/15/23 0500 10/15/23 0633 10/15/23 0641  BP:  120/62  139/66  Pulse: 79 80 85 80  Resp: (!) 27 (!) 23  (!) 22  Temp:  97.6 F (36.4 C)    TempSrc:  Axillary    SpO2: 91% 90% (!) 88% 90%  Weight:    76.1 kg  Height:    5\' 3"  (1.6 m)    Gen: Awake, alert, chronically ill-appearing CV: Regular, normal S1, S2, no murmurs  Resp: Improving respiratory distress on BiPAP.  Extremely poor air movement with no wheezes in the setting.   Abd: Flat, normoactive, nontender MSK: Symmetric, no edema  Skin: No rashes or lesions to exposed skin  Neuro: Alert and interactive  Psych: euthymic, appropriate    Data review:   Labs reviewed, notable  for:   VBG 7.2/122, bicarb 43 WBC 8 Flu/COVID/RSV negative  Micro:  Results for orders placed or performed during the hospital encounter of 10/15/23  Resp panel by RT-PCR (RSV, Flu A&B, Covid) Anterior Nasal Swab     Status: None   Collection Time: 10/15/23  4:09 AM   Specimen: Anterior Nasal Swab  Result Value Ref Range Status   SARS Coronavirus 2 by RT PCR NEGATIVE NEGATIVE Final    Comment: (NOTE) SARS-CoV-2 target nucleic acids are NOT DETECTED.  The SARS-CoV-2 RNA is generally detectable in upper respiratory specimens during the acute phase of infection. The lowest concentration of SARS-CoV-2 viral copies this assay can detect is 138 copies/mL. A negative result does not preclude SARS-Cov-2 infection and should not be used as the sole basis for treatment or other patient management decisions. A negative result may occur with  improper specimen collection/handling, submission of specimen other than nasopharyngeal swab, presence of viral mutation(s) within the areas targeted by this assay, and inadequate number of viral copies(<138 copies/mL). A negative result must be combined with clinical observations, patient history, and epidemiological information. The expected result is Negative.  Fact Sheet for Patients:  BloggerCourse.com  Fact Sheet for Healthcare Providers:  SeriousBroker.it  This test is no t yet approved or cleared by the [place] FDA and  has been authorized for detection and/or diagnosis of SARS-CoV-2 by FDA under an Emergency Use Authorization (EUA). This EUA will remain  in effect (meaning this test can be used) for the duration of the COVID-19 declaration under Section 564(b)(1) of the Act, 21 U.S.C.section 360bbb-3(b)(1), unless the authorization is terminated  or revoked sooner.       Influenza A by PCR NEGATIVE NEGATIVE Final   Influenza B by PCR NEGATIVE NEGATIVE Final    Comment: (NOTE) The  Xpert Xpress SARS-CoV-2/FLU/RSV plus assay is intended as an aid in the diagnosis of influenza from Nasopharyngeal swab specimens and should not be used as a sole basis for treatment. Nasal washings and aspirates are unacceptable for Xpert Xpress SARS-CoV-2/FLU/RSV testing.  Fact Sheet for Patients: BloggerCourse.com  Fact Sheet for Healthcare Providers: SeriousBroker.it  This test is not yet approved or cleared by the [place] FDA and has been authorized for detection and/or diagnosis of SARS-CoV-2 by FDA under an Emergency Use Authorization (EUA). This EUA will remain in effect (meaning this test can be used) for the duration of the COVID-19 declaration under Section 564(b)(1) of the Act, 21 U.S.C. section 360bbb-3(b)(1), unless the authorization is terminated or revoked.     Resp Syncytial Virus by PCR NEGATIVE NEGATIVE Final    Comment: (NOTE) Fact Sheet for Patients: BloggerCourse.com  Fact Sheet for Healthcare  Providers: SeriousBroker.it  This test is not yet approved or cleared by the [place] and has been authorized for detection and/or diagnosis of SARS-CoV-2 by FDA under an Emergency Use Authorization (EUA). This EUA will remain in effect (meaning this test can be used) for the duration of the COVID-19 declaration under Section 564(b)(1) of the Act, 21 U.S.C. section 360bbb-3(b)(1), unless the authorization is terminated or revoked.  Performed at [facility], [address] [place], [place] [address]     Imaging reviewed:  [facility] Chest Port 1 View Result Date: 10/15/2023 CLINICAL DATA:  Shortness of breath EXAM: PORTABLE CHEST 1 VIEW COMPARISON:  08/30/2023, CT 08/31/2023 FINDINGS: Emphysema. No acute airspace disease, pleural effusion or pneumothorax. Normal cardiomediastinal silhouette. IMPRESSION: No active disease. Emphysema.  Electronically Signed   By: Jasmine Pang M.D.   On: 10/15/2023 03:59    EKG: Personally reviewed sinus rhythm, RAD, PRWP, no acute ischemic changes.  ED Course:  Received methylprednisolone, magnesium, nebs BMS.  Treated with additional nebs the ED.  Placed on BiPAP due to hypercarbic respiratory failure and work of breathing with improvement in clinical course.   Assessment/Plan:  62 y.o. 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 Admitted with COPD exacerbation and acute on chronic hypercarbic respiratory failure requiring BiPAP.  COPD exacerbation, severe Acute on chronic hypercarbic respiratory failure 2 days of shortness of breath, new cough with white sputum, Increased somnolence.  Presented with respiratory distress requiring BiPAP for work of breathing and initial VBG with partially uncompensated acute on chronic hypercarbic respiratory failure 7.2/122, bicarb 43.  Sats normal on 30% FiO2.  Chest x-ray without infiltrate.  Etiology of her exacerbation is likely progression of her underlying COPD and continued smoking. -Continuous BiPAP for rescue.  Repeat VBG to ensure hypercarbia improving/moving towards baseline -Continue methylprednisolone 60 mg IV every 12 hours for now until work of breathing improves -Levofloxacin 500 mg p.o. daily x 5 days -Continue home LAMA/ICS equivalent, can add home LAMA back when off scheduled DuoNeb.  DuoNebs every 6 hours scheduled, albuterol every 4 hours as needed, incentive spirometer, flutter valve, encourage out of bed to chair. -Home O2 evaluation prior to discharge -Consider referral to pulmonary rehab -Smoking cessation see below  Smoking cessation Discussed severe and end-stage nature of her COPD with chronic hypoxic respiratory  failure and worsening hypercarbic failure, need for smoking cessation to preserve her remaining lung function and improve hopefully both her quality and quantity of remaining life. -She appears more motivated to stop smoking after our discussion.  Interested in nicotine replacement therapy and possibly starting on bupropion or varenicline for additional therapy.  We also discussed importance of a supportive home environment around non-smokers, possibly pursuing CBT. -While inpatient nicotine 14 mg patch daily, additional gum as needed.  Would Rx with NRT, plus minus additional medical therapy if she is interested.  Chronic medical problems: OSA/COPD: BiPAP with sleep RV failure: Noted, likely cor pulmonale from lung disease.  Holding home Lasix appears euvolemic. History A-fib: Currently sinus rhythm.  Continue home Eliquis, metoprolol 50 mg daily History of DVT: AC per above Hypertension: On beta-blocker per above. Hyperthyroidism: Continue home methimazole History of pulmonary nodule: Outpatient follow-up. History adrenal adenoma: Outpatient follow-up  Body mass index is 29.72 kg/m.    DVT prophylaxis:  Eliquis Code Status:  Full Code Diet:  Diet Orders (From admission, onward)     Start     Ordered   10/15/23 0520  Diet NPO time specified Except for: Ice Chips, Sips with Meds  Diet effective now       Question Answer Comment  Except for Ice Chips   Except for Sips with Meds      10/15/23 0521           Family Communication:  None   Consults:  None   Admission status:   Inpatient, Step Down Unit  Severity of Illness: The appropriate patient status for this patient is INPATIENT. Inpatient status is judged to be reasonable and necessary in order to provide the required intensity of service to ensure the patient's safety. The patient's presenting symptoms, physical exam findings, and initial radiographic and laboratory data in the context of their chronic comorbidities is felt  to place them at high risk for further clinical deterioration. Furthermore, it is not anticipated that the patient will be medically stable for discharge from the hospital within 2 midnights of admission.   * I certify that at the point of admission it is my clinical judgment that the patient will require inpatient hospital care spanning beyond 2 midnights from the point of admission due to high intensity of service, high risk for further deterioration and high frequency of surveillance required.*   Dolly Rias, MD [facility]  How to contact the [facility] Attending or Consulting provider 7A - 7P or covering provider during after hours 7P -7A, for this patient.  Check the care team in [facility] and look for a) attending/consulting [facility] provider listed and b) the [facility] team listed Log into [email or web address] and use [place]'s universal password to access. If you do not have the password, please contact the hospital operator. Locate the [facility] provider you are looking for under [facility] and page to a number that you can be directly reached. If you still have difficulty reaching the provider, please page the [facility] (Director on Call) for the Hospitalists listed on amion for assistance.  10/15/2023, 7:02 AM

## 2023-10-15 NOTE — Assessment & Plan Note (Signed)
 10-15-2023 continue with Solu-Medrol 60 mg every 12 hours.  Continue with nebs every 6 hours.  No need for antibiotics at this time.

## 2023-10-15 NOTE — Progress Notes (Signed)
 PROGRESS NOTE    Yvette Booth  RUE:[id number] DOB: [birth year] DOA: 10/15/2023 PCP: [facility]  Subjective: Patient seen and examined.  On BiPAP.  Comfortable.  Slight wheezing on BiPAP.  Sleeping comfortably.   Hospital Course: HPI: Yvette Booth is a 64 y.o. 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  Reports over the past 2 days daughter has noted she has been more somnolent than usual.  Over the same time she has become increasingly dyspneic unable to do any activity.  Still on home O2 2 L.  Reports new cough with white sputum.  Reports still smoking about 1/4 to 1/2 pack a day, however feels worse after smoking and this is led to reduction in ppd.  She is interested in pursuing further smoking cessation.  Notably confused about inhalers and has multiple extra's, not sure what she is supposed be using on a daily basis.   Significant Events: Admitted 10/15/2023 copd exacerbation   Significant Labs: WBC 8.9, HgB 15.5, plt 256 ABG pH 7.25, PCO2 122, PO2 41 Na 143, K 4.7, CO2 of 42, BUN 14, scr 0.68, glu 142 Covid/flu/rsv Negative  Significant Imaging Studies: CXR No active disease. Emphysema   Antibiotic Therapy: Anti-infectives (From admission, onward)    Start     Dose/Rate Route Frequency Ordered Stop   10/15/23 1000  levofloxacin (LEVAQUIN) tablet 500 mg        500 mg Oral Daily 10/15/23 0521 10/20/23 0959       Procedures:   Consultants:     Assessment and Plan: * COPD exacerbation (HCC) 10-15-2023 continue with Solu-Medrol 60 mg every 12 hours.  Continue with nebs every 6 hours.  No need for antibiotics at this time.  Acute on chronic respiratory failure with hypoxia and hypercapnia (HCC) 10-15-2023 her baseline pCO2 was  somewhere between 80 and 90 mmHg.  Repeat VBG this morning showed PCO2 down to 96.  pH is increased to 7.3.  Continue with BiPAP.  Given her severe CO2 retention, targeting pulse oximetry between 88 and 92%.  PAF (paroxysmal atrial fibrillation) (HCC) 10-15-2023 in NSR on admission. On Eliquis bid.  Encounter for smoking cessation counseling 10-15-2023 pt needs to stop smoking.  DVT prophylaxis:  apixaban (ELIQUIS) tablet 5 mg     Code Status: Full Code Family Communication: no family at bedside Disposition Plan: unknown Reason for continuing need for hospitalization: remains on bipap, IV steroids.  Objective: Vitals:   10/15/23 0800 10/15/23 0900 10/15/23 1000 10/15/23 1100  BP:  (!) 125/59 129/60 (!) 129/57  Pulse:  77 78 70  Resp:  (!) 22 (!) 28 20  Temp: 97.8 F (36.6 C)     TempSrc: Axillary     SpO2:  94% 98% 98%  Weight:      Height:       No intake or output data in the 24 hours ending 10/15/23 1127 Filed Weights   10/15/23 0348 10/15/23 0641  Weight: 74 kg 76.1 kg    Examination:  Physical Exam Vitals and nursing note reviewed.  Constitutional:      General: She is not in acute distress.    Appearance: She is not toxic-appearing or diaphoretic.     Comments: Chronically ill  HENT:     Head: Normocephalic and atraumatic.  Cardiovascular:  Rate and Rhythm: Normal rate and regular rhythm.  Pulmonary:     Comments: Wearing bipap  Soft diffuse wheezing Abdominal:     General: Bowel sounds are normal. There is no distension.     Palpations: Abdomen is soft.  Musculoskeletal:     Right lower leg: No edema.     Left lower leg: No edema.  Skin:    General: Skin is warm and dry.     Capillary Refill: Capillary refill takes less than 2 seconds.  Neurological:     Comments: Sleeping upright in bed     Data Reviewed: I have personally reviewed following labs and imaging studies  CBC: Recent Labs  Lab 10/15/23 0338  WBC 8.9  NEUTROABS 5.1  HGB  15.5*  HCT 52.0*  MCV 95.8  PLT 256   Basic Metabolic Panel: Recent Labs  Lab 10/15/23 0338  NA 143  K 4.7  CL 93*  CO2 43*  GLUCOSE 142*  BUN 14  CREATININE 0.68  CALCIUM 9.4   GFR: Estimated Creatinine Clearance: 70.3 mL/min (by C-G formula based on SCr of 0.68 mg/dL). BNP (last 3 results) Recent Labs    08/21/23 0317 08/30/23 1415  BNP 37.7 64.7    Recent Labs    10/15/23 0338 10/15/23 0658  PHVEN 7.24* 7.3  PCO2VEN 122* 96*  PO2VEN 41 39  HCO3 52.3* 47.7*  O2SAT 75.8 76.9   Recent Results (from the past 240 hours)  Resp panel by RT-PCR (RSV, Flu A&B, Covid) Anterior Nasal Swab     Status: None   Collection Time: 10/15/23  4:09 AM   Specimen: Anterior Nasal Swab  Result Value Ref Range Status   SARS Coronavirus 2 by RT PCR NEGATIVE NEGATIVE Final    Comment: (NOTE) SARS-CoV-2 target nucleic acids are NOT DETECTED.  The SARS-CoV-2 RNA is generally detectable in upper respiratory specimens during the acute phase of infection. The lowest concentration of SARS-CoV-2 viral copies this assay can detect is 138 copies/mL. A negative result does not preclude SARS-Cov-2 infection and should not be used as the sole basis for treatment or other patient management decisions. A negative result may occur with  improper specimen collection/handling, submission of specimen other than nasopharyngeal swab, presence of viral mutation(s) within the areas targeted by this assay, and inadequate number of viral copies(<138 copies/mL). A negative result must be combined with clinical observations, patient history, and epidemiological information. The expected result is Negative.  Fact Sheet for Patients:  BloggerCourse.com  Fact Sheet for Healthcare Providers:  SeriousBroker.it  This test is no t yet approved or cleared by the [place] FDA and  has been authorized for detection and/or diagnosis of SARS-CoV-2 by FDA  under an Emergency Use Authorization (EUA). This EUA will remain  in effect (meaning this test can be used) for the duration of the COVID-19 declaration under Section 564(b)(1) of the Act, 21 U.S.C.section 360bbb-3(b)(1), unless the authorization is terminated  or revoked sooner.       Influenza A by PCR NEGATIVE NEGATIVE Final   Influenza B by PCR NEGATIVE NEGATIVE Final    Comment: (NOTE) The Xpert Xpress SARS-CoV-2/FLU/RSV plus assay is intended as an aid in the diagnosis of influenza from Nasopharyngeal swab specimens and should not be used as a sole basis for treatment. Nasal washings and aspirates are unacceptable for Xpert Xpress SARS-CoV-2/FLU/RSV testing.  Fact Sheet for Patients: BloggerCourse.com  Fact Sheet for Healthcare Providers: SeriousBroker.it  This test is not yet approved or cleared by  the [employer] and has been authorized for detection and/or diagnosis of SARS-CoV-2 by FDA under an Emergency Use Authorization (EUA). This EUA will remain in effect (meaning this test can be used) for the duration of the COVID-19 declaration under Section 564(b)(1) of the Act, 21 U.S.C. section 360bbb-3(b)(1), unless the authorization is terminated or revoked.     Resp Syncytial Virus by PCR NEGATIVE NEGATIVE Final    Comment: (NOTE) Fact Sheet for Patients: BloggerCourse.com  Fact Sheet for Healthcare Providers: SeriousBroker.it  This test is not yet approved or cleared by the [place] FDA and has been authorized for detection and/or diagnosis of SARS-CoV-2 by FDA under an Emergency Use Authorization (EUA). This EUA will remain in effect (meaning this test can be used) for the duration of the COVID-19 declaration under Section 564(b)(1) of the Act, 21 U.S.C. section 360bbb-3(b)(1), unless the authorization is terminated or revoked.  Performed at [facility], [address], [place], [place] [address]   MRSA Next Gen by PCR, Nasal     Status: Abnormal   Collection Time: 10/15/23  6:42 AM   Specimen: Nasal Mucosa; Nasal Swab  Result Value Ref Range Status   MRSA by PCR Next Gen DETECTED (A) NOT DETECTED Final    Comment: RESULT CALLED TO, READ BACK BY AND VERIFIED WITH: PHILLIPE, A. AT 1914 ON 10/15/2023 BY JE (NOTE) The GeneXpert MRSA Assay (FDA approved for NASAL specimens only), is one component of a comprehensive MRSA colonization surveillance program. It is not intended to diagnose MRSA infection nor to guide or monitor treatment for MRSA infections. Test performance is not FDA approved in patients less than 48 years old. Performed at [facility], [address] [place], [place] [address]      Radiology Studies: [facility] Chest Port 1 View Result Date: 10/15/2023 CLINICAL DATA:  Shortness of breath EXAM: PORTABLE CHEST 1 VIEW COMPARISON:  08/30/2023, CT 08/31/2023 FINDINGS: Emphysema. No acute airspace disease, pleural effusion or pneumothorax. Normal cardiomediastinal silhouette. IMPRESSION: No active disease. Emphysema. Electronically Signed   By: Jasmine Pang M.D.   On: 10/15/2023 03:59    Scheduled Meds:  apixaban  5 mg Oral BID   Chlorhexidine Gluconate Cloth  6 each Topical Daily   fluticasone furoate-vilanterol  1 puff Inhalation Daily   ipratropium-albuterol  3 mL Nebulization Q6H   levofloxacin  500 mg Oral Daily   methimazole  20 mg Oral Daily   methylPREDNISolone (SOLU-MEDROL) injection  60 mg Intravenous Q12H   metoprolol succinate  50 mg Oral Daily   nicotine  14 mg Transdermal Daily   sodium chloride flush  3 mL Intravenous Q12H   Continuous Infusions:   LOS: 0 days   Time spent: 45 minutes  Carollee Herter, DO  [facility]  10/15/2023, 11:27 AM

## 2023-10-16 DIAGNOSIS — J441 Chronic obstructive pulmonary disease with (acute) exacerbation: Secondary | ICD-10-CM | POA: Diagnosis not present

## 2023-10-16 LAB — BASIC METABOLIC PANEL WITH GFR
Anion gap: 8 (ref 5–15)
BUN: 24 mg/dL — ABNORMAL HIGH (ref 8–23)
CO2: 39 mmol/L — ABNORMAL HIGH (ref 22–32)
Calcium: 9.3 mg/dL (ref 8.9–10.3)
Chloride: 93 mmol/L — ABNORMAL LOW (ref 98–111)
Creatinine, Ser: 0.75 mg/dL (ref 0.44–1.00)
GFR, Estimated: >60 mL/min (ref >60)
Glucose, Bld: 135 mg/dL — ABNORMAL HIGH (ref 70–99)
Potassium: 4.5 mmol/L (ref 3.5–5.1)
Sodium: 140 mmol/L (ref 135–145)

## 2023-10-16 LAB — CBC
HCT: 47.2 % — ABNORMAL HIGH (ref 36.0–46.0)
Hemoglobin: 14.1 g/dL (ref 12.0–15.0)
MCH: 28.5 pg (ref 26.0–34.0)
MCHC: 29.9 g/dL — ABNORMAL LOW (ref 30.0–36.0)
MCV: 95.5 fL (ref 80.0–100.0)
Platelets: 218 10*3/uL (ref 150–400)
RBC: 4.94 MIL/uL (ref 3.87–5.11)
RDW: 13.5 % (ref 11.5–15.5)
WBC: 8.6 10*3/uL (ref 4.0–10.5)
nRBC: 0 % (ref 0.0–0.2)

## 2023-10-16 LAB — BLOOD GAS, VENOUS
Acid-Base Excess: 17.1 mmol/L — ABNORMAL HIGH (ref 0.0–2.0)
Bicarbonate: 48 mmol/L — ABNORMAL HIGH (ref 20.0–28.0)
O2 Saturation: 88.2 %
Patient temperature: 37
pCO2, Ven: 89 mmHg (ref 44–60)
pH, Ven: 7.34 (ref 7.25–7.43)
pO2, Ven: 53 mmHg — ABNORMAL HIGH (ref 32–45)

## 2023-10-16 LAB — PHOSPHORUS: Phosphorus: 3.6 mg/dL (ref 2.5–4.6)

## 2023-10-16 LAB — MAGNESIUM: Magnesium: 2.7 mg/dL — ABNORMAL HIGH (ref 1.7–2.4)

## 2023-10-16 MED ORDER — BENZONATATE 100 MG PO CAPS
100.0000 mg | ORAL_CAPSULE | Freq: Three times a day (TID) | ORAL | Status: DC
  Administered 2023-10-16 – 2023-10-18 (×7): 100 mg via ORAL
  Filled 2023-10-16 (×7): qty 1

## 2023-10-16 MED ORDER — METHYLPREDNISOLONE SODIUM SUCC 125 MG IJ SOLR
125.0000 mg | Freq: Every day | INTRAMUSCULAR | Status: DC
  Administered 2023-10-16 – 2023-10-17 (×2): 125 mg via INTRAVENOUS
  Filled 2023-10-16 (×3): qty 2

## 2023-10-16 NOTE — Plan of Care (Signed)
 Patient up in chair today, does become short of breath with activity. Patient reports that the tessalon pearls did help with the cought.  O2 remains at 2 liters of oxygen. Education provided for smoking cessation.

## 2023-10-16 NOTE — Progress Notes (Signed)
 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 Daughter has noted she has been more somnolent than usual.  Over the same time she has become increasingly dyspneic,  unable to do any activity.  Still on home O2 2 L. She reports new cough with white sputum.  She also reports still smoking about 1/4 to 1/2 pack a day, however feels worse after smoking and this has led to reduction in ppd.  She is interested in pursuing further smoking cessation.  Notably confused about inhalers and has multiple extra's, not sure what she is supposed be using on a daily basis.  Patient admitted for acute on chronic hypoxic and hypercapnic respiratory failure secondary to COPD exacerbation.  Assessment & Plan:   Principal Problem:   COPD exacerbation (HCC) Active Problems:   Acute on chronic respiratory failure with hypoxia and hypercapnia (HCC)   Encounter for smoking cessation counseling   PAF (paroxysmal atrial fibrillation) (HCC)  COPD exacerbation (HCC): Continue with Solu-Medrol 60 mg every 12 hours.   Continue with nebs every 6 hours scheduled and as needed..   Continue Levaquin 750 mg daily for 5 days. Continue Breo Ellipta.   Acute on chronic respiratory failure with hypoxia and hypercapnia (HCC) Her baseline pCO2 remains somewhere between 80 and 90 mmHg.   Repeat VBG this morning showed PCO2 down to 96 > 89.  pH is increased to 7.3.  Continue with BiPAP.  Given her severe CO2 retention, targeting pulse oximetry between 88 and 92%. Patient was counseled extensively about using CPAP at night.   PAF (paroxysmal  atrial fibrillation) (HCC) NSR on admission.  Continue metoprolol and Eliquis.   Encounter for smoking cessation counseling Patient needs to stop smoking. Counseled  extensively in detail. Nicotine patch daily.  Hyperthyroidism:  Continue methimazole 20 mg daily  Essential hypertension: Continue metoprolol 50 mg daily.  History of DVT: Continue Eliquis.   DVT prophylaxis: Eliquis Code Status: Full code Family Communication: No family at bedside. Disposition Plan:   Status is: Inpatient Remains inpatient appropriate because: Admitted for acute on chronic hypoxic and hypercapnic respiratory failure sec to COPD exacerbation   Consultants:  None  Procedures:None  Antimicrobials:  Anti-infectives (From admission, onward)    Start     Dose/Rate Route Frequency Ordered Stop   10/15/23 1000  levofloxacin (LEVAQUIN) tablet 500 mg        500 mg Oral Daily 10/15/23 0521 10/20/23 0959      Subjective: Patient was seen and examined at bedside. Overnight events noted.   Patient reports feeling better,  She remains on 2 L of supplemental oxygen. Patient still reports she continues to smoke,  she is struggling to quit smoking.  Objective: Vitals:   10/16/23 0634 10/16/23 0852 10/16/23 0855 10/16/23 1036  BP:    125/71  Pulse:   80 95  Resp:   (!) 25   Temp: 98.1 F (36.7 C)     TempSrc: Axillary     SpO2:  100% 100%   Weight:      Height:        Intake/Output Summary (Last 24 hours) at 10/16/2023 1045 Last data filed  at 10/15/2023 2012 Gross per 24 hour  Intake 246 ml  Output --  Net 246 ml   Filed Weights   10/15/23 0348 10/15/23 0641  Weight: 74 kg 76.1 kg    Examination:  General exam: Appears calm and comfortable, not in any acute distress. Respiratory system: Wheezing bilaterally,  respiratory effort normal.  RR 15 Cardiovascular system: S1 & S2 heard, RRR. No JVD, murmurs, rubs, gallops or clicks. No pedal edema. Gastrointestinal system: Abdomen is non  distended, soft and non tender. Normal bowel sounds heard. Central nervous system: Alert and oriented X 3. No focal neurological deficits. Extremities: No edema, no cyanosis, no clubbing Skin: No rashes, lesions or ulcers Psychiatry: Judgement and insight appear normal. Mood & affect appropriate.   Data Reviewed: I have personally reviewed following labs and imaging studies  CBC: Recent Labs  Lab 10/15/23 0338 10/16/23 0304  WBC 8.9 8.6  NEUTROABS 5.1  --   HGB 15.5* 14.1  HCT 52.0* 47.2*  MCV 95.8 95.5  PLT 256 218   Basic Metabolic Panel: Recent Labs  Lab 10/15/23 0338 10/16/23 0304  NA 143 140  K 4.7 4.5  CL 93* 93*  CO2 43* 39*  GLUCOSE 142* 135*  BUN 14 24*  CREATININE 0.68 0.75  CALCIUM 9.4 9.3  MG  --  2.7*  PHOS  --  3.6   GFR: Estimated Creatinine Clearance: 70.3 mL/min (by C-G formula based on SCr of 0.75 mg/dL). Liver Function Tests: No results for input(s): "AST", "ALT", "ALKPHOS", "BILITOT", "PROT", "ALBUMIN" in the last 168 hours. No results for input(s): "LIPASE", "AMYLASE" in the last 168 hours. No results for input(s): "AMMONIA" in the last 168 hours. Coagulation Profile: No results for input(s): "INR", "PROTIME" in the last 168 hours. Cardiac Enzymes: No results for input(s): "CKTOTAL", "CKMB", "CKMBINDEX", "TROPONINI" in the last 168 hours. BNP (last 3 results) No results for input(s): "PROBNP" in the last 8760 hours. HbA1C: No results for input(s): "HGBA1C" in the last 72 hours. CBG: No results for input(s): "GLUCAP" in the last 168 hours. Lipid Profile: No results for input(s): "CHOL", "HDL", "LDLCALC", "TRIG", "CHOLHDL", "LDLDIRECT" in the last 72 hours. Thyroid Function Tests: No results for input(s): "TSH", "T4TOTAL", "FREET4", "T3FREE", "THYROIDAB" in the last 72 hours. Anemia Panel: No results for input(s): "VITAMINB12", "FOLATE", "FERRITIN", "TIBC", "IRON", "RETICCTPCT" in the last 72 hours. Sepsis Labs: No results for input(s):  "PROCALCITON", "LATICACIDVEN" in the last 168 hours.  Recent Results (from the past 240 hours)  Resp panel by RT-PCR (RSV, Flu A&B, Covid) Anterior Nasal Swab     Status: None   Collection Time: 10/15/23  4:09 AM   Specimen: Anterior Nasal Swab  Result Value Ref Range Status   SARS Coronavirus 2 by RT PCR NEGATIVE NEGATIVE Final    Comment: (NOTE) SARS-CoV-2 target nucleic acids are NOT DETECTED.  The SARS-CoV-2 RNA is generally detectable in upper respiratory specimens during the acute phase of infection. The lowest concentration of SARS-CoV-2 viral copies this assay can detect is 138 copies/mL. A negative result does not preclude SARS-Cov-2 infection and should not be used as the sole basis for treatment or other patient management decisions. A negative result may occur with  improper specimen collection/handling, submission of specimen other than nasopharyngeal swab, presence of viral mutation(s) within the areas targeted by this assay, and inadequate number of viral copies(<138 copies/mL). A negative result must be combined with clinical observations, patient history, and epidemiological information. The expected result is Negative.  Fact Sheet  for Patients:  BloggerCourse.com  Fact Sheet for Healthcare Providers:  SeriousBroker.it  This test is no t yet approved or cleared by the [place]  FDA and  has been authorized for detection and/or diagnosis of SARS-CoV-2 by FDA under an Emergency Use Authorization (EUA). This EUA will remain  in effect (meaning this test can be used) for the duration of the COVID-19 declaration under Section 564(b)(1) of the Act, 21 U.S.C.section 360bbb-3(b)(1), unless the authorization is terminated  or revoked sooner.       Influenza A by PCR NEGATIVE NEGATIVE Final   Influenza B by PCR NEGATIVE NEGATIVE Final    Comment: (NOTE) The Xpert Xpress SARS-CoV-2/FLU/RSV plus assay is intended as  an aid in the diagnosis of influenza from Nasopharyngeal swab specimens and should not be used as a sole basis for treatment. Nasal washings and aspirates are unacceptable for Xpert Xpress SARS-CoV-2/FLU/RSV testing.  Fact Sheet for Patients: BloggerCourse.com  Fact Sheet for Healthcare Providers: SeriousBroker.it  This test is not yet approved or cleared by the [place]  FDA and has been authorized for detection and/or diagnosis of SARS-CoV-2 by FDA under an Emergency Use Authorization (EUA). This EUA will remain in effect (meaning this test can be used) for the duration of the COVID-19 declaration under Section 564(b)(1) of the Act, 21 U.S.C. section 360bbb-3(b)(1), unless the authorization is terminated or revoked.     Resp Syncytial Virus by PCR NEGATIVE NEGATIVE Final    Comment: (NOTE) Fact Sheet for Patients: BloggerCourse.com  Fact Sheet for Healthcare Providers: SeriousBroker.it  This test is not yet approved or cleared by the [place]  FDA and has been authorized for detection and/or diagnosis of SARS-CoV-2 by FDA under an Emergency Use Authorization (EUA). This EUA will remain in effect (meaning this test can be used) for the duration of the COVID-19 declaration under Section 564(b)(1) of the Act, 21 U.S.C. section 360bbb-3(b)(1), unless the authorization is terminated or revoked.  Performed at [facility], [address], [place], [place] [address]   MRSA Next Gen by PCR, Nasal     Status: Abnormal   Collection Time: 10/15/23  6:42 AM   Specimen: Nasal Mucosa; Nasal Swab  Result Value Ref Range Status   MRSA by PCR Next Gen DETECTED (A) NOT DETECTED Final    Comment: RESULT CALLED TO, READ BACK BY AND VERIFIED WITH: PHILLIPE, A. AT 6045 ON 10/15/2023 BY JE (NOTE) The GeneXpert MRSA Assay (FDA approved for NASAL specimens only), is  one component of a comprehensive MRSA colonization surveillance program. It is not intended to diagnose MRSA infection nor to guide or monitor treatment for MRSA infections. Test performance is not FDA approved in patients less than 45 years old. Performed at [facility], [address] [place], [place] [address]     Radiology Studies: [facility] Chest Port 1 View Result Date: 10/15/2023 CLINICAL DATA:  Shortness of breath EXAM: PORTABLE CHEST 1 VIEW COMPARISON:  08/30/2023, CT 08/31/2023 FINDINGS: Emphysema. No acute airspace disease, pleural effusion or pneumothorax. Normal cardiomediastinal silhouette. IMPRESSION: No active disease. Emphysema. Electronically Signed   By: Esmeralda Hedge M.D.   On: 10/15/2023 03:59   Scheduled Meds:  apixaban  5 mg Oral BID   benzonatate  100 mg Oral TID   Chlorhexidine Gluconate Cloth  6 each Topical Daily   fluticasone furoate-vilanterol  1 puff Inhalation Daily   ipratropium-albuterol  3 mL Nebulization Q6H   levofloxacin  500 mg Oral Daily   methimazole  20  mg Oral Daily   methylPREDNISolone (SOLU-MEDROL) injection  125 mg Intravenous Daily   metoprolol succinate  50 mg Oral Daily   nicotine  14 mg Transdermal Daily   sodium chloride flush  3 mL Intravenous Q12H   Continuous Infusions:   LOS: 1 day    Time spent: 50 mins    Magdalene School, MD [facility]   If 7PM-7AM, please contact night-coverage

## 2023-10-16 NOTE — Plan of Care (Addendum)
 Patient slept well on BIPAP overnight without any signs of distress or discomfort. VS stable overnight as well.  Problem: Education: Goal: Knowledge of General Education information will improve Description: Including pain rating scale, medication(s)/side effects and non-pharmacologic comfort measures Outcome: Progressing   Problem: Health Behavior/Discharge Planning: Goal: Ability to manage health-related needs will improve Outcome: Progressing   Problem: Clinical Measurements: Goal: Ability to maintain clinical measurements within normal limits will improve Outcome: Progressing Goal: Will remain free from infection Outcome: Progressing Goal: Diagnostic test results will improve Outcome: Progressing Goal: Respiratory complications will improve Outcome: Progressing Goal: Cardiovascular complication will be avoided Outcome: Progressing   Problem: Activity: Goal: Risk for activity intolerance will decrease Outcome: Progressing   Problem: Nutrition: Goal: Adequate nutrition will be maintained Outcome: Progressing   Problem: Coping: Goal: Level of anxiety will decrease Outcome: Progressing   Problem: Elimination: Goal: Will not experience complications related to bowel motility Outcome: Progressing Goal: Will not experience complications related to urinary retention Outcome: Progressing   Problem: Pain Managment: Goal: General experience of comfort will improve and/or be controlled Outcome: Progressing   Problem: Safety: Goal: Ability to remain free from injury will improve Outcome: Progressing   Problem: Skin Integrity: Goal: Risk for impaired skin integrity will decrease Outcome: Progressing

## 2023-10-17 DIAGNOSIS — J441 Chronic obstructive pulmonary disease with (acute) exacerbation: Secondary | ICD-10-CM | POA: Diagnosis not present

## 2023-10-17 NOTE — Plan of Care (Signed)

## 2023-10-17 NOTE — Progress Notes (Signed)
 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 Daughter has noted she has been more somnolent than usual.  Over the same time she has become increasingly dyspneic,  unable to do any activity.  Still on home O2 2 L. She reports new cough with white sputum.  She also reports still smoking about 1/4 to 1/2 pack a day, however feels worse after smoking and this has led to reduction in ppd.  She is interested in pursuing further smoking cessation.  Notably confused about inhalers and has multiple extra's, not sure what she is supposed be using on a daily basis.  Patient admitted for acute on chronic hypoxic and hypercapnic respiratory failure secondary to COPD exacerbation.  Assessment & Plan:   Principal Problem:   COPD exacerbation (HCC) Active Problems:   Acute on chronic respiratory failure with hypoxia and hypercapnia (HCC)   Encounter for smoking cessation counseling   PAF (paroxysmal atrial fibrillation) (HCC)  COPD with Acute exacerbation (HCC): Continue with Solu-Medrol 60 mg every 12 hours.   Continue with Duoneb nebs every 6 hours scheduled and as needed..   Continue Levaquin 750 mg daily for 5 days. Continue Breo Ellipta. She reports feeling much better and asks to go home today.   Acute on chronic respiratory failure with hypoxia and hypercapnia (HCC) Her baseline pCO2 remains somewhere between 80 and 90 mmHg.   Repeat VBG yesterday morning showed PCO2 down to 96 > 89.  pH is increased to 7.3.   Continue with BiPAP.  Given her severe CO2 retention, targeting pulse oximetry between 88 and  92%. Patient was counseled extensively about using CPAP at night.   PAF (paroxysmal atrial fibrillation) (HCC) NSR on admission.  Continue metoprolol and Eliquis.   Encounter for smoking cessation counseling Patient needs to stop smoking. Counseled  extensively in detail. Nicotine patch daily.  Hyperthyroidism:  Continue methimazole 20 mg daily  Essential hypertension: Continue metoprolol 50 mg daily.  History of DVT: Continue Eliquis.   DVT prophylaxis: Eliquis Code Status: Full code Family Communication: No family at bedside. Disposition Plan:   Status is: Inpatient Remains inpatient appropriate because: Admitted for acute on chronic hypoxic and hypercapnic respiratory failure sec to COPD exacerbation   Consultants:  None  Procedures:None  Antimicrobials:  Anti-infectives (From admission, onward)    Start     Dose/Rate Route Frequency Ordered Stop   10/15/23 1000  levofloxacin (LEVAQUIN) tablet 500 mg        500 mg Oral Daily 10/15/23 0521 10/20/23 0959      Subjective: Patient was seen and examined at bedside. Overnight events noted.   Patient reports feeling much better, She has used BiPAP throughout the night. Patient still reports she continues to smoke,  she is struggling to quit smoking.  Objective: Vitals:   10/17/23 0400 10/17/23 0653 10/17/23 0719 10/17/23 0910  BP:   (!) 158/79   Pulse:   88   Resp:   (!) 31   Temp: 97.7 F (36.5 C) 98 F (36.7 C)    TempSrc: Axillary Oral    SpO2:   94% 98%  Weight:      Height:  Intake/Output Summary (Last 24 hours) at 10/17/2023 1104 Last data filed at 10/17/2023 0933 Gross per 24 hour  Intake 1340 ml  Output 1100 ml  Net 240 ml   Filed Weights   10/15/23 0348 10/15/23 0641  Weight: 74 kg 76.1 kg    Examination:  General exam: Appears calm and comfortable, not in any acute distress. Respiratory system: Mild wheezing bilaterally,  respiratory effort normal.  RR 15 Cardiovascular system:  S1 & S2 heard, RRR. No JVD, murmurs, rubs, gallops or clicks. No pedal edema. Gastrointestinal system: Abdomen is non distended, soft and non tender. Normal bowel sounds heard. Central nervous system: Alert and oriented X 3. No focal neurological deficits. Extremities: No edema, no cyanosis, no clubbing Skin: No rashes, lesions or ulcers Psychiatry: Judgement and insight appear normal. Mood & affect appropriate.   Data Reviewed: I have personally reviewed following labs and imaging studies  CBC: Recent Labs  Lab 10/15/23 0338 10/16/23 0304  WBC 8.9 8.6  NEUTROABS 5.1  --   HGB 15.5* 14.1  HCT 52.0* 47.2*  MCV 95.8 95.5  PLT 256 218   Basic Metabolic Panel: Recent Labs  Lab 10/15/23 0338 10/16/23 0304  NA 143 140  K 4.7 4.5  CL 93* 93*  CO2 43* 39*  GLUCOSE 142* 135*  BUN 14 24*  CREATININE 0.68 0.75  CALCIUM 9.4 9.3  MG  --  2.7*  PHOS  --  3.6   GFR: Estimated Creatinine Clearance: 70.3 mL/min (by C-G formula based on SCr of 0.75 mg/dL). Liver Function Tests: No results for input(s): "AST", "ALT", "ALKPHOS", "BILITOT", "PROT", "ALBUMIN" in the last 168 hours. No results for input(s): "LIPASE", "AMYLASE" in the last 168 hours. No results for input(s): "AMMONIA" in the last 168 hours. Coagulation Profile: No results for input(s): "INR", "PROTIME" in the last 168 hours. Cardiac Enzymes: No results for input(s): "CKTOTAL", "CKMB", "CKMBINDEX", "TROPONINI" in the last 168 hours. BNP (last 3 results) No results for input(s): "PROBNP" in the last 8760 hours. HbA1C: No results for input(s): "HGBA1C" in the last 72 hours. CBG: No results for input(s): "GLUCAP" in the last 168 hours. Lipid Profile: No results for input(s): "CHOL", "HDL", "LDLCALC", "TRIG", "CHOLHDL", "LDLDIRECT" in the last 72 hours. Thyroid Function Tests: No results for input(s): "TSH", "T4TOTAL", "FREET4", "T3FREE", "THYROIDAB" in the last 72 hours. Anemia Panel: No results for input(s):  "VITAMINB12", "FOLATE", "FERRITIN", "TIBC", "IRON", "RETICCTPCT" in the last 72 hours. Sepsis Labs: No results for input(s): "PROCALCITON", "LATICACIDVEN" in the last 168 hours.  Recent Results (from the past 240 hours)  Resp panel by RT-PCR (RSV, Flu A&B, Covid) Anterior Nasal Swab     Status: None   Collection Time: 10/15/23  4:09 AM   Specimen: Anterior Nasal Swab  Result Value Ref Range Status   SARS Coronavirus 2 by RT PCR NEGATIVE NEGATIVE Final    Comment: (NOTE) SARS-CoV-2 target nucleic acids are NOT DETECTED.  The SARS-CoV-2 RNA is generally detectable in upper respiratory specimens during the acute phase of infection. The lowest concentration of SARS-CoV-2 viral copies this assay can detect is 138 copies/mL. A negative result does not preclude SARS-Cov-2 infection and should not be used as the sole basis for treatment or other patient management decisions. A negative result may occur with  improper specimen collection/handling, submission of specimen other than nasopharyngeal swab, presence of viral mutation(s) within the areas targeted by this assay, and inadequate number of viral copies(<138 copies/mL). A negative result must be combined with clinical observations,  patient history, and epidemiological information. The expected result is Negative.  Fact Sheet for Patients:  BloggerCourse.com  Fact Sheet for Healthcare Providers:  SeriousBroker.it  This test is no t yet approved or cleared by the [place]  FDA and  has been authorized for detection and/or diagnosis of SARS-CoV-2 by FDA under an Emergency Use Authorization (EUA). This EUA will remain  in effect (meaning this test can be used) for the duration of the COVID-19 declaration under Section 564(b)(1) of the Act, 21 U.S.C.section 360bbb-3(b)(1), unless the authorization is terminated  or revoked sooner.       Influenza A by PCR NEGATIVE NEGATIVE Final    Influenza B by PCR NEGATIVE NEGATIVE Final    Comment: (NOTE) The Xpert Xpress SARS-CoV-2/FLU/RSV plus assay is intended as an aid in the diagnosis of influenza from Nasopharyngeal swab specimens and should not be used as a sole basis for treatment. Nasal washings and aspirates are unacceptable for Xpert Xpress SARS-CoV-2/FLU/RSV testing.  Fact Sheet for Patients: BloggerCourse.com  Fact Sheet for Healthcare Providers: SeriousBroker.it  This test is not yet approved or cleared by the [place]  FDA and has been authorized for detection and/or diagnosis of SARS-CoV-2 by FDA under an Emergency Use Authorization (EUA). This EUA will remain in effect (meaning this test can be used) for the duration of the COVID-19 declaration under Section 564(b)(1) of the Act, 21 U.S.C. section 360bbb-3(b)(1), unless the authorization is terminated or revoked.     Resp Syncytial Virus by PCR NEGATIVE NEGATIVE Final    Comment: (NOTE) Fact Sheet for Patients: BloggerCourse.com  Fact Sheet for Healthcare Providers: SeriousBroker.it  This test is not yet approved or cleared by the [place]  FDA and has been authorized for detection and/or diagnosis of SARS-CoV-2 by FDA under an Emergency Use Authorization (EUA). This EUA will remain in effect (meaning this test can be used) for the duration of the COVID-19 declaration under Section 564(b)(1) of the Act, 21 U.S.C. section 360bbb-3(b)(1), unless the authorization is terminated or revoked.  Performed at [facility], [address] [place], [place] [address]   MRSA Next Gen by PCR, Nasal     Status: Abnormal   Collection Time: 10/15/23  6:42 AM   Specimen: Nasal Mucosa; Nasal Swab  Result Value Ref Range Status   MRSA by PCR Next Gen DETECTED (A) NOT DETECTED Final    Comment: RESULT CALLED TO, READ BACK BY AND VERIFIED  WITH: PHILLIPE, A. AT 6045 ON 10/15/2023 BY JE (NOTE) The GeneXpert MRSA Assay (FDA approved for NASAL specimens only), is one component of a comprehensive MRSA colonization surveillance program. It is not intended to diagnose MRSA infection nor to guide or monitor treatment for MRSA infections. Test performance is not FDA approved in patients less than 80 years old. Performed at [facility], [address] [place], [place] [address]     Radiology Studies: No results found.  Scheduled Meds:  apixaban  5 mg Oral BID   benzonatate  100 mg Oral TID   Chlorhexidine Gluconate Cloth  6 each Topical Daily   fluticasone furoate-vilanterol  1 puff Inhalation Daily   ipratropium-albuterol  3 mL Nebulization Q6H   levofloxacin  500 mg Oral Daily   methimazole  20 mg Oral Daily   methylPREDNISolone (SOLU-MEDROL) injection  125 mg Intravenous Daily   metoprolol succinate  50 mg Oral Daily   nicotine  14 mg Transdermal Daily   sodium chloride flush  3 mL Intravenous Q12H  Continuous Infusions:   LOS: 2 days    Time spent: 35 mins    Magdalene School, MD [facility]   If 7PM-7AM, please contact night-coverage

## 2023-10-17 NOTE — Plan of Care (Signed)
  Problem: Education: Goal: Knowledge of General Education information will improve Description: Including pain rating scale, medication(s)/side effects and non-pharmacologic comfort measures Outcome: Progressing   Problem: Health Behavior/Discharge Planning: Goal: Ability to manage health-related needs will improve Outcome: Progressing   Problem: Clinical Measurements: Goal: Ability to maintain clinical measurements within normal limits will improve Outcome: Progressing Goal: Will remain free from infection Outcome: Progressing Goal: Diagnostic test results will improve Outcome: Progressing Goal: Respiratory complications will improve Outcome: Progressing Goal: Cardiovascular complication will be avoided Outcome: Progressing   Problem: Elimination: Goal: Will not experience complications related to bowel motility Outcome: Progressing Goal: Will not experience complications related to urinary retention Outcome: Progressing   Problem: Activity: Goal: Risk for activity intolerance will decrease Outcome: Progressing

## 2023-10-17 NOTE — Progress Notes (Signed)
   10/17/23 0135  BiPAP/CPAP/SIPAP  $ Non-Invasive Ventilator  Non-Invasive Vent Subsequent  $ Face Mask Medium Yes  BiPAP/CPAP/SIPAP Pt Type Adult  BiPAP/CPAP/SIPAP SERVO  Mask Type Full face mask  Mask Size Medium  Respiratory Rate 26 breaths/min  IPAP 14 cmH20  EPAP 6 cmH2O  PEEP 6 cmH20  FiO2 (%) 40 %  Minute Ventilation 11.3  Tidal Volume (Vt) 566  Heater Temperature  (on)  Patient Home Machine No  Patient Home Mask No  Patient Home Tubing No  Press High Alarm 25 cmH2O  Oxygen Percent 40 %

## 2023-10-18 DIAGNOSIS — J441 Chronic obstructive pulmonary disease with (acute) exacerbation: Secondary | ICD-10-CM | POA: Diagnosis not present

## 2023-10-18 LAB — CBC
HCT: 50.1 % — ABNORMAL HIGH (ref 36.0–46.0)
Hemoglobin: 14.7 g/dL (ref 12.0–15.0)
MCH: 28.4 pg (ref 26.0–34.0)
MCHC: 29.3 g/dL — ABNORMAL LOW (ref 30.0–36.0)
MCV: 96.7 fL (ref 80.0–100.0)
Platelets: 214 10*3/uL (ref 150–400)
RBC: 5.18 MIL/uL — ABNORMAL HIGH (ref 3.87–5.11)
RDW: 13.4 % (ref 11.5–15.5)
WBC: 7.5 10*3/uL (ref 4.0–10.5)
nRBC: 0 % (ref 0.0–0.2)

## 2023-10-18 LAB — BASIC METABOLIC PANEL WITH GFR
Anion gap: 7 (ref 5–15)
BUN: 23 mg/dL (ref 8–23)
CO2: 43 mmol/L — ABNORMAL HIGH (ref 22–32)
Calcium: 9.4 mg/dL (ref 8.9–10.3)
Chloride: 94 mmol/L — ABNORMAL LOW (ref 98–111)
Creatinine, Ser: 0.72 mg/dL (ref 0.44–1.00)
GFR, Estimated: >60 mL/min (ref >60)
Glucose, Bld: 101 mg/dL — ABNORMAL HIGH (ref 70–99)
Potassium: 4 mmol/L (ref 3.5–5.1)
Sodium: 144 mmol/L (ref 135–145)

## 2023-10-18 MED ORDER — PREDNISONE 20 MG PO TABS
40.0000 mg | ORAL_TABLET | Freq: Every day | ORAL | Status: DC
  Administered 2023-10-18: 40 mg via ORAL
  Filled 2023-10-18: qty 2

## 2023-10-18 MED ORDER — BENZONATATE 100 MG PO CAPS: 100.0000 mg | ORAL_CAPSULE | Freq: Three times a day (TID) | ORAL | 0 refills | Status: DC

## 2023-10-18 MED ORDER — LEVOFLOXACIN 500 MG PO TABS
500.0000 mg | ORAL_TABLET | Freq: Every day | ORAL | 0 refills | Status: AC
Start: 2023-10-19 — End: 2023-10-21

## 2023-10-18 MED ORDER — PREDNISONE 20 MG PO TABS: 40.0000 mg | ORAL_TABLET | Freq: Every day | ORAL | 0 refills | Status: AC

## 2023-10-18 NOTE — Plan of Care (Signed)

## 2023-10-18 NOTE — Progress Notes (Signed)
   10/18/23 0115  BiPAP/CPAP/SIPAP  BiPAP/CPAP/SIPAP Pt Type Adult  BiPAP/CPAP/SIPAP Resmed  Mask Type Full face mask  Dentures removed? Not applicable  Mask Size Medium  Respiratory Rate 20 breaths/min  IPAP 14 cmH20  EPAP 6 cmH2O  Pressure Support 8 cmH20  FiO2 (%) 28 %  Flow Rate 2 lpm  Patient Home Machine No  Patient Home Mask No  Patient Home Tubing No  Auto Titrate No  Device Plugged into RED Power Outlet Yes  Oxygen Percent 28 %  BiPAP/CPAP /SiPAP Vitals  Resp (!) 29  SpO2 94 %  MEWS Score/Color  MEWS Score 2  MEWS Score Color Yellow

## 2023-10-18 NOTE — Discharge Summary (Signed)
 Physician Discharge Summary  Yvette Booth NUU:[id number] DOB: 1960-09-30 DOA: 10/15/2023  PCP: [facility]  Admit date: 10/15/2023  Discharge date: 10/18/2023  Admitted From: Home  Disposition: Home  Recommendations for Outpatient Follow-up:  Follow up with PCP in 1-2 weeks. Please obtain BMP/CBC in one week. Advised to take levofloxacin 750 mg daily for 2 more days. Advised to take prednisone 40 mg daily for 3 more days for COPD exacerbation. Advised to continue supplemental oxygen,  Advised to use CPAP at night.  Encouraged Compliance.  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 Daughter has noted she has been more somnolent than usual.  Over the same time she has become increasingly dyspneic,  unable to do any activity.  Still on home O2 2 L. She reports new cough with white sputum.  She also reports still smoking about 1/4 to 1/2 pack a day, however feels worse after smoking and this has led to reduction in ppd.  She is interested in pursuing further smoking cessation.  Notably confused about inhalers and has multiple extra's, not sure what she is supposed be using on a daily basis.  Patient was admitted for acute on chronic hypoxic and hypercapnic respiratory failure secondary to COPD exacerbation.  Patient was continued on IV steroids, Levaquin, scheduled and as needed nebulized bronchodilators.  Patient has made significant improvement with above treatment.  She is back to her baseline oxygen requirement. Patient has used BiPAP in the  night, felt much improved.  She wants to be discharged home.  Encourage compliance with the CPAP.  Patient being discharged home.   Discharge Diagnoses:  Principal Problem:   COPD exacerbation (HCC) Active Problems:   Acute on chronic respiratory failure with hypoxia and hypercapnia (HCC)   Encounter for smoking cessation counseling   PAF (paroxysmal atrial fibrillation) (HCC)  COPD with Acute exacerbation (HCC): Continued with Solu-Medrol 60 mg every 12 hours.   Continued with Duoneb nebs every 6 hours scheduled and as needed..   Continued  Levaquin 750 mg daily for 5 days. Continue Breo Ellipta. Feels much better,  wants to be discharged. Patient is being discharged home on prednisone 40 mg for 3 more days.   Acute on chronic respiratory failure with hypoxia and hypercapnia (HCC) Her baseline pCO2 remains somewhere between 80 and 90 mmHg.   Repeat VBG yesterday morning showed PCO2 down to 96 > 89.  pH is increased to 7.3.   Continue with BiPAP.  Given her severe CO2 retention, targeting pulse oximetry between 88 and 92%. Patient was counseled extensively about using CPAP at night.   PAF (paroxysmal atrial fibrillation) (HCC) NSR on admission.  Continue metoprolol and Eliquis.   Encounter for smoking cessation counseling Patient needs to stop smoking. Counseled  extensively in detail. Nicotine patch daily.   Hyperthyroidism:  Continue methimazole 20 mg daily   Essential hypertension: Continue metoprolol 50 mg daily.   History of DVT: Continue Eliquis.      Discharge Instructions  Discharge Instructions     Call MD for:  difficulty breathing, headache or visual disturbances   Complete by: As directed    Call MD  for:  persistant dizziness or light-headedness   Complete by: As directed    Call MD for:  persistant nausea and vomiting   Complete by: As directed    Diet - low sodium heart healthy   Complete by: As directed    Diet Carb Modified   Complete by: As  directed    Discharge instructions   Complete by: As directed    Advised to follow-up with primary care physician in 1 week. Advised to take levofloxacin 750 mg for 2 more days. Advised to take prednisone 40 mg for 3 more days for COPD exacerbation. Advised to continue supplemental oxygen advised to use CPAP at night.  Encouraged.   Increase activity slowly   Complete by: As directed       Allergies as of 10/18/2023       Reactions   Seroquel [quetiapine] Other (See Comments)   Delirium and confusion         Medication List     TAKE these medications    albuterol 108 (90 Base) MCG/ACT inhaler Commonly known as: VENTOLIN HFA Inhale 2 puffs into the lungs every 6 (six) hours as needed for wheezing or shortness of breath.   benzonatate 100 MG capsule Commonly known as: TESSALON Take 1 capsule (100 mg total) by mouth 3 (three) times daily.   Eliquis 5 MG Tabs tablet Generic drug: apixaban Take 5 mg by mouth 2 (two) times daily.   fluticasone-salmeterol 250-50 MCG/ACT Aepb Commonly known as: ADVAIR Inhale 1 puff into the lungs in the morning and at bedtime.   furosemide 40 MG tablet Commonly known as: LASIX Take 40 mg by mouth daily.   Incruse Ellipta 62.5 MCG/ACT Aepb Generic drug: umeclidinium bromide Inhale 1 puff into the lungs daily.   levofloxacin 500 MG tablet Commonly known as: LEVAQUIN Take 1 tablet (500 mg total) by mouth daily for 2 days. Start taking on: October 19, 2023   methimazole 10 MG tablet Commonly known as: TAPAZOLE Take 20 mg by mouth daily.   metoprolol succinate 50 MG 24 hr tablet Commonly known as: TOPROL-XL Take 50 mg by mouth daily. Take with or immediately following a meal.   NICODERM CQ TD Place 1 patch onto the skin daily as needed (for smoking cessation while hospitalized).   OXYGEN Inhale 2 L/min into the lungs continuous.   predniSONE 20 MG tablet Commonly known as: DELTASONE Take 2 tablets (40 mg total) by mouth daily  with breakfast for 3 days. What changed:  medication strength how much to take how to take this when to take this additional instructions        Follow-up Information     [facility] Follow up in 1 week(s).   Specialty: Pediatrics Contact information: [address] [place] [address] [phone]                Allergies  Allergen Reactions   Seroquel [Quetiapine] Other (See Comments)    Delirium and confusion     Consultations: None   Procedures/Studies: DG Chest Port 1 View Result Date: 10/15/2023 CLINICAL DATA:  Shortness of breath EXAM: PORTABLE CHEST 1 VIEW COMPARISON:  08/30/2023, CT 08/31/2023 FINDINGS: Emphysema. No acute airspace disease, pleural effusion or pneumothorax. Normal cardiomediastinal silhouette. IMPRESSION: No active disease. Emphysema. Electronically Signed   By: Esmeralda Hedge M.D.   On: 10/15/2023 03:59   Subjective: Patient was seen and examined at bedside.  Overnight events noted.   Patient reports  doing much better,  breathing has improved.  She wants to be discharged home.  Discharge Exam: Vitals:   10/18/23 0809 10/18/23 0910  BP:  103/82  Pulse:  82  Resp:  (!) 21  Temp:  98.7 F (37.1 C)  SpO2: 97% 96%   Vitals:   10/18/23 0455 10/18/23 0721 10/18/23 0809 10/18/23 0910  BP: (!) 150/75 138/84  103/82  Pulse: 72 82  82  Resp:  17  (!) 21  Temp: 98.2 F (36.8 C) 98.4 F (36.9 C)  98.7 F (37.1 C)  TempSrc:  Oral  Oral  SpO2: 100% 92% 97% 96%  Weight:      Height:        General: Pt is alert, awake, not in acute distress Cardiovascular: RRR, S1/S2 +, no rubs, no gallops Respiratory: CTA bilaterally, no wheezing, no rhonchi Abdominal: Soft, NT, ND, bowel sounds + Extremities: no edema, no cyanosis    The results of significant diagnostics from this hospitalization (including imaging, microbiology, ancillary and laboratory) are listed below for reference.      Microbiology: Recent Results (from the past 240 hours)  Resp panel by RT-PCR (RSV, Flu A&B, Covid) Anterior Nasal Swab     Status: None   Collection Time: 10/15/23  4:09 AM   Specimen: Anterior Nasal Swab  Result Value Ref Range Status   SARS Coronavirus 2 by RT PCR NEGATIVE NEGATIVE Final    Comment: (NOTE) SARS-CoV-2 target nucleic acids are NOT DETECTED.  The SARS-CoV-2 RNA is generally detectable in upper respiratory specimens during the acute phase of infection. The lowest concentration of SARS-CoV-2 viral copies this assay can detect is 138 copies/mL. A negative result does not preclude SARS-Cov-2 infection and should not be used as the sole basis for treatment or other patient management decisions. A negative result may occur with  improper specimen collection/handling, submission of specimen other than nasopharyngeal swab, presence of viral mutation(s) within the areas targeted by this assay, and inadequate number of viral copies(<138 copies/mL). A negative result must be combined with clinical observations, patient history, and epidemiological information. The expected result is Negative.  Fact Sheet for Patients:  BloggerCourse.com  Fact Sheet for Healthcare Providers:  SeriousBroker.it  This test is no t yet approved or cleared by the [place]  FDA and  has been authorized for detection and/or diagnosis of SARS-CoV-2 by FDA under an Emergency Use Authorization (EUA). This EUA will remain  in effect (meaning this test can be used) for the duration of the COVID-19 declaration under Section 564(b)(1) of the Act, 21 U.S.C.section 360bbb-3(b)(1), unless the authorization is terminated  or revoked sooner.       Influenza A by PCR NEGATIVE NEGATIVE Final   Influenza B by PCR NEGATIVE NEGATIVE Final    Comment: (NOTE) The Xpert Xpress SARS-CoV-2/FLU/RSV plus assay is intended as an aid in the diagnosis of  influenza from Nasopharyngeal swab specimens and should not be used as a sole basis for treatment. Nasal washings and aspirates are unacceptable for Xpert Xpress SARS-CoV-2/FLU/RSV testing.  Fact Sheet for Patients: BloggerCourse.com  Fact Sheet for Healthcare Providers: SeriousBroker.it  This test is not yet approved or cleared by the [place]  FDA and has been authorized for detection and/or diagnosis of SARS-CoV-2 by FDA under an Emergency Use Authorization (EUA). This EUA will remain in effect (meaning this test can be used) for the duration of the COVID-19 declaration under Section 564(b)(1) of the Act, 21 U.S.C. section 360bbb-3(b)(1), unless the authorization is terminated  or revoked.     Resp Syncytial Virus by PCR NEGATIVE NEGATIVE Final    Comment: (NOTE) Fact Sheet for Patients: BloggerCourse.com  Fact Sheet for Healthcare Providers: SeriousBroker.it  This test is not yet approved or cleared by the [place]  FDA and has been authorized for detection and/or diagnosis of SARS-CoV-2 by FDA under an Emergency Use Authorization (EUA). This EUA will remain in effect (meaning this test can be used) for the duration of the COVID-19 declaration under Section 564(b)(1) of the Act, 21 U.S.C. section 360bbb-3(b)(1), unless the authorization is terminated or revoked.  Performed at [facility], [address] [place], [place] [address]   MRSA Next Gen by PCR, Nasal     Status: Abnormal   Collection Time: 10/15/23  6:42 AM   Specimen: Nasal Mucosa; Nasal Swab  Result Value Ref Range Status   MRSA by PCR Next Gen DETECTED (A) NOT DETECTED Final    Comment: RESULT CALLED TO, READ BACK BY AND VERIFIED WITH: PHILLIPE, A. AT 6213 ON 10/15/2023 BY JE (NOTE) The GeneXpert MRSA Assay (FDA approved for NASAL specimens only), is one component of a  comprehensive MRSA colonization surveillance program. It is not intended to diagnose MRSA infection nor to guide or monitor treatment for MRSA infections. Test performance is not FDA approved in patients less than 64 years old. Performed at [facility], [address], [place], [place] [address]      Labs: BNP (last 3 results) Recent Labs    08/21/23 0317 08/30/23 1415  BNP 37.7 64.7   Basic Metabolic Panel: Recent Labs  Lab 10/15/23 0338 10/16/23 0304 10/18/23 0529  NA 143 140 144  K 4.7 4.5 4.0  CL 93* 93* 94*  CO2 43* 39* 43*  GLUCOSE 142* 135* 101*  BUN 14 24* 23  CREATININE 0.68 0.75 0.72  CALCIUM 9.4 9.3 9.4  MG  --  2.7*  --   PHOS  --  3.6  --    Liver Function Tests: No results for input(s): "AST", "ALT", "ALKPHOS", "BILITOT", "PROT", "ALBUMIN" in the last 168 hours. No results for input(s): "LIPASE", "AMYLASE" in the last 168 hours. No results for input(s): "AMMONIA" in the last 168 hours. CBC: Recent Labs  Lab 10/15/23 0338 10/16/23 0304 10/18/23 0529  WBC 8.9 8.6 7.5  NEUTROABS 5.1  --   --   HGB 15.5* 14.1 14.7  HCT 52.0* 47.2* 50.1*  MCV 95.8 95.5 96.7  PLT 256 218 214   Cardiac Enzymes: No results for input(s): "CKTOTAL", "CKMB", "CKMBINDEX", "TROPONINI" in the last 168 hours. BNP: Invalid input(s): "POCBNP" CBG: No results for input(s): "GLUCAP" in the last 168 hours. D-Dimer No results for input(s): "DDIMER" in the last 72 hours. Hgb A1c No results for input(s): "HGBA1C" in the last 72 hours. Lipid Profile No results for input(s): "CHOL", "HDL", "LDLCALC", "TRIG", "CHOLHDL", "LDLDIRECT" in the last 72 hours. Thyroid function studies No results for input(s): "TSH", "T4TOTAL", "T3FREE", "THYROIDAB" in the last 72 hours.  Invalid input(s): "FREET3" Anemia work up No results for input(s): "VITAMINB12", "FOLATE", "FERRITIN", "TIBC", "IRON", "RETICCTPCT" in the last 72 hours. Urinalysis No results found for:  "COLORURINE", "APPEARANCEUR", "LABSPEC", "PHURINE", "GLUCOSEU", "HGBUR", "BILIRUBINUR", "KETONESUR", "PROTEINUR", "UROBILINOGEN", "NITRITE", "LEUKOCYTESUR" Sepsis Labs Recent Labs  Lab 10/15/23 0338 10/16/23 0304 10/18/23 0529  WBC 8.9 8.6 7.5   Microbiology Recent Results (from the past 240 hours)  Resp panel by RT-PCR (RSV, Flu A&B, Covid) Anterior Nasal Swab     Status:  None   Collection Time: 10/15/23  4:09 AM   Specimen: Anterior Nasal Swab  Result Value Ref Range Status   SARS Coronavirus 2 by RT PCR NEGATIVE NEGATIVE Final    Comment: (NOTE) SARS-CoV-2 target nucleic acids are NOT DETECTED.  The SARS-CoV-2 RNA is generally detectable in upper respiratory specimens during the acute phase of infection. The lowest concentration of SARS-CoV-2 viral copies this assay can detect is 138 copies/mL. A negative result does not preclude SARS-Cov-2 infection and should not be used as the sole basis for treatment or other patient management decisions. A negative result may occur with  improper specimen collection/handling, submission of specimen other than nasopharyngeal swab, presence of viral mutation(s) within the areas targeted by this assay, and inadequate number of viral copies(<138 copies/mL). A negative result must be combined with clinical observations, patient history, and epidemiological information. The expected result is Negative.  Fact Sheet for Patients:  BloggerCourse.com  Fact Sheet for Healthcare Providers:  SeriousBroker.it  This test is no t yet approved or cleared by the [place] FDA and  has been authorized for detection and/or diagnosis of SARS-CoV-2 by FDA under an Emergency Use Authorization (EUA). This EUA will remain  in effect (meaning this test can be used) for the duration of the COVID-19 declaration under Section 564(b)(1) of the Act, 21 U.S.C.section 360bbb-3(b)(1), unless the authorization is  terminated  or revoked sooner.       Influenza A by PCR NEGATIVE NEGATIVE Final   Influenza B by PCR NEGATIVE NEGATIVE Final    Comment: (NOTE) The Xpert Xpress SARS-CoV-2/FLU/RSV plus assay is intended as an aid in the diagnosis of influenza from Nasopharyngeal swab specimens and should not be used as a sole basis for treatment. Nasal washings and aspirates are unacceptable for Xpert Xpress SARS-CoV-2/FLU/RSV testing.  Fact Sheet for Patients: BloggerCourse.com  Fact Sheet for Healthcare Providers: SeriousBroker.it  This test is not yet approved or cleared by the [place] FDA and has been authorized for detection and/or diagnosis of SARS-CoV-2 by FDA under an Emergency Use Authorization (EUA). This EUA will remain in effect (meaning this test can be used) for the duration of the COVID-19 declaration under Section 564(b)(1) of the Act, 21 U.S.C. section 360bbb-3(b)(1), unless the authorization is terminated or revoked.     Resp Syncytial Virus by PCR NEGATIVE NEGATIVE Final    Comment: (NOTE) Fact Sheet for Patients: BloggerCourse.com  Fact Sheet for Healthcare Providers: SeriousBroker.it  This test is not yet approved or cleared by the [place] FDA and has been authorized for detection and/or diagnosis of SARS-CoV-2 by FDA under an Emergency Use Authorization (EUA). This EUA will remain in effect (meaning this test can be used) for the duration of the COVID-19 declaration under Section 564(b)(1) of the Act, 21 U.S.C. section 360bbb-3(b)(1), unless the authorization is terminated or revoked.  Performed at [facility], [address] [place], [place] [address]   MRSA Next Gen by PCR, Nasal     Status: Abnormal   Collection Time: 10/15/23  6:42 AM   Specimen: Nasal Mucosa; Nasal Swab  Result Value Ref Range Status   MRSA by PCR Next Gen  DETECTED (A) NOT DETECTED Final    Comment: RESULT CALLED TO, READ BACK BY AND VERIFIED WITH: PHILLIPE, A. AT 9629 ON 10/15/2023 BY JE (NOTE) The GeneXpert MRSA Assay (FDA approved for NASAL specimens only), is one component of a comprehensive MRSA colonization surveillance program. It is not intended to diagnose MRSA infection  nor to guide or monitor treatment for MRSA infections. Test performance is not FDA approved in patients less than 87 years old. Performed at [facility], [address] [place], [place] [address]      Time coordinating discharge: Over 30 minutes  SIGNED:   Magdalene School, MD  [facility] 10/18/2023, 2:57 PM Pager   If 7PM-7AM, please contact night-coverage

## 2023-10-18 NOTE — Discharge Instructions (Signed)
 Advised to follow-up with primary care physician in 1 week. Advised to take levofloxacin 750 mg for 2 more days. Advised to take prednisone 40 mg for 3 more days for COPD exacerbation. Advised to continue supplemental oxygen advised to use CPAP at night.  Encouraged.

## 2023-10-18 NOTE — TOC Transition Note (Signed)
 Transition of Care [facility]) - Discharge Note   Patient Details  Name: Yvette Booth MRN: [id number] Date of Birth: [birth year]  Transition of Care [facility] CM/SW Contact:  Ruben Corolla, RN Phone Number: 10/18/2023, 11:43 AM   Clinical Narrative:Patient states she has all dme 2 home-home 02-her grandson will bring travel tank to hospital,has 3n1,rw, bipap, 02, She states she will get her own w/c. Has her grandson for pick up from hospital.No further CM needs.      Final next level of care: Home/Self Care Barriers to Discharge: No Barriers Identified   Patient Goals and CMS Choice Patient states their goals for this hospitalization and ongoing recovery are:: Home CMS Medicare.gov Compare Post Acute Care list provided to:: Patient Choice offered to / list presented to : Patient      Discharge Placement                       Discharge Plan and Services Additional resources added to the After Visit Summary for     Discharge Planning Services: NA Post Acute Care Choice: NA          DME Arranged: N/A                    Social Drivers of Health (SDOH) Interventions SDOH Screenings   Food Insecurity: Patient Declined (10/15/2023)  Housing: Patient Declined (10/15/2023)  Transportation Needs: Patient Declined (10/15/2023)  Utilities: Patient Declined (10/15/2023)  Financial Resource Strain: Low Risk  (12/16/2022)   Received from [facility]  Physical Activity: Not on File (10/23/2021)   Received from [facility]  Social Connections: Not on File (04/01/2023)   Received from [facility]  Stress: Not on File (10/23/2021)   Received from [facility]  Tobacco Use: High Risk (10/15/2023)     Readmission Risk Interventions    08/22/2023   10:27 AM  Readmission Risk Prevention Plan  Post Dischage Appt Complete  Medication Screening Complete  Transportation Screening Complete

## 2023-10-18 NOTE — Plan of Care (Signed)

## 2023-10-18 NOTE — Progress Notes (Signed)
 Went in to administer solumedrol medication. Upon assessment for IV access, Yvette Booth reports she removed both IV sites over night. She states "I pulled them, I'm bad about that, I didn't even know I was doing that."   Unable to give IV solumedrol dose this morning due to lack of IV access, will touch base with Dr. Elsworth Halt to see if IV site is needed because patient is eager to discharge home today.

## 2023-10-18 NOTE — Progress Notes (Signed)
 Nursing Discharge Note   Name: Yvette Booth MRN: [id number] DOB: 01-09-1961   Admit Date:  10/15/2023 Discharge Date:  10/18/2023   Yvette Booth is to be discharged home per MD order.  AVS completed. Reviewed with patient at bedside. Highlighted copy provided for patient to take home.  Patient able to verbalize understanding of discharge instructions. PIV removed. Patient stable upon discharge.   Yvette Booth was educated on smoking cessation and AVS information provided on the [place] and other resources.  Grandson brought portable oxygen with him to discharge her home on. Wheeled to lobby with hospital tank and then transferred to home oxygen.     Discharge Instructions      Advised to follow-up with primary care physician in 1 week. Advised to take levofloxacin 750 mg for 2 more days. Advised to take prednisone 40 mg for 3 more days for COPD exacerbation. Advised to continue supplemental oxygen advised to use CPAP at night.  Encouraged.     Discharge Instructions     Call MD for:  difficulty breathing, headache or visual disturbances   Complete by: As directed    Call MD for:  persistant dizziness or light-headedness   Complete by: As directed    Call MD for:  persistant nausea and vomiting   Complete by: As directed    Diet - low sodium heart healthy   Complete by: As directed    Diet Carb Modified   Complete by: As directed    Discharge instructions   Complete by: As directed    Advised to follow-up with primary care physician in 1 week. Advised to take levofloxacin 750 mg for 2 more days. Advised to take prednisone 40 mg for 3 more days for COPD exacerbation. Advised to continue supplemental oxygen advised to use CPAP at night.  Encouraged.   Increase activity slowly   Complete by: As directed         Allergies as of 10/18/2023       Reactions   Seroquel [quetiapine] Other (See Comments)   Delirium and confusion         Medication List     TAKE these  medications    albuterol 108 (90 Base) MCG/ACT inhaler Commonly known as: VENTOLIN HFA Inhale 2 puffs into the lungs every 6 (six) hours as needed for wheezing or shortness of breath.   benzonatate 100 MG capsule Commonly known as: TESSALON Take 1 capsule (100 mg total) by mouth 3 (three) times daily.   Eliquis 5 MG Tabs tablet Generic drug: apixaban Take 5 mg by mouth 2 (two) times daily.   fluticasone-salmeterol 250-50 MCG/ACT Aepb Commonly known as: ADVAIR Inhale 1 puff into the lungs in the morning and at bedtime.   furosemide 40 MG tablet Commonly known as: LASIX Take 40 mg by mouth daily.   Incruse Ellipta 62.5 MCG/ACT Aepb Generic drug: umeclidinium bromide Inhale 1 puff into the lungs daily.   levofloxacin 500 MG tablet Commonly known as: LEVAQUIN Take 1 tablet (500 mg total) by mouth daily for 2 days. Start taking on: October 19, 2023   methimazole 10 MG tablet Commonly known as: TAPAZOLE Take 20 mg by mouth daily.   metoprolol succinate 50 MG 24 hr tablet Commonly known as: TOPROL-XL Take 50 mg by mouth daily. Take with or immediately following a meal.   NICODERM CQ TD Place 1 patch onto the skin daily as needed (for smoking cessation while hospitalized).   OXYGEN Inhale 2 L/min into the lungs  continuous.   predniSONE 20 MG tablet Commonly known as: DELTASONE Take 2 tablets (40 mg total) by mouth daily with breakfast for 3 days. What changed:  medication strength how much to take how to take this when to take this additional instructions         Discharge Instructions/ Education: An After Visit Summary was printed and given to the patient. Discharge instructions given to patient with verbalized understanding. Additional discharge instructions as indicated by discharging provider also reviewed.  Patient and family able to verbalize understanding, all questions fully answered. Patient instructed to return to Emergency Department, call 911, or call  MD for any changes in condition.   Patient escorted via wheelchair to lobby and discharged home via private automobile.

## 2023-11-14 ENCOUNTER — Other Ambulatory Visit: Payer: Self-pay

## 2023-11-14 ENCOUNTER — Inpatient Hospital Stay (HOSPITAL_COMMUNITY)
Admission: EM | Admit: 2023-11-14 | Discharge: 2023-11-17 | DRG: 189 | Disposition: A | Discharge status: Home / Self Care | Attending: Internal Medicine | Admitting: Internal Medicine

## 2023-11-14 ENCOUNTER — Encounter (HOSPITAL_COMMUNITY): Payer: Self-pay | Admitting: Emergency Medicine

## 2023-11-14 ENCOUNTER — Emergency Department (HOSPITAL_COMMUNITY)

## 2023-11-14 DIAGNOSIS — Z720 Tobacco use: Secondary | ICD-10-CM | POA: Diagnosis present

## 2023-11-14 DIAGNOSIS — E039 Hypothyroidism, unspecified: Secondary | ICD-10-CM | POA: Diagnosis present

## 2023-11-14 DIAGNOSIS — Z9981 Dependence on supplemental oxygen: Secondary | ICD-10-CM

## 2023-11-14 DIAGNOSIS — J9621 Acute and chronic respiratory failure with hypoxia: Secondary | ICD-10-CM | POA: Diagnosis present

## 2023-11-14 DIAGNOSIS — Z91199 Patient's noncompliance with other medical treatment and regimen due to unspecified reason: Secondary | ICD-10-CM | POA: Diagnosis not present

## 2023-11-14 DIAGNOSIS — R0689 Other abnormalities of breathing: Secondary | ICD-10-CM | POA: Diagnosis present

## 2023-11-14 DIAGNOSIS — Z79899 Other long term (current) drug therapy: Secondary | ICD-10-CM

## 2023-11-14 DIAGNOSIS — E0590 Thyrotoxicosis, unspecified without thyrotoxic crisis or storm: Secondary | ICD-10-CM | POA: Diagnosis present

## 2023-11-14 DIAGNOSIS — I50810 Right heart failure, unspecified: Secondary | ICD-10-CM | POA: Diagnosis present

## 2023-11-14 DIAGNOSIS — Z7951 Long term (current) use of inhaled steroids: Secondary | ICD-10-CM

## 2023-11-14 DIAGNOSIS — I110 Hypertensive heart disease with heart failure: Secondary | ICD-10-CM | POA: Diagnosis present

## 2023-11-14 DIAGNOSIS — Z7901 Long term (current) use of anticoagulants: Secondary | ICD-10-CM | POA: Diagnosis not present

## 2023-11-14 DIAGNOSIS — E785 Hyperlipidemia, unspecified: Secondary | ICD-10-CM | POA: Diagnosis present

## 2023-11-14 DIAGNOSIS — Z8673 Personal history of transient ischemic attack (TIA), and cerebral infarction without residual deficits: Secondary | ICD-10-CM

## 2023-11-14 DIAGNOSIS — Z1152 Encounter for screening for COVID-19: Secondary | ICD-10-CM | POA: Diagnosis not present

## 2023-11-14 DIAGNOSIS — F17210 Nicotine dependence, cigarettes, uncomplicated: Secondary | ICD-10-CM | POA: Diagnosis present

## 2023-11-14 DIAGNOSIS — J441 Chronic obstructive pulmonary disease with (acute) exacerbation: Secondary | ICD-10-CM | POA: Diagnosis present

## 2023-11-14 DIAGNOSIS — I480 Paroxysmal atrial fibrillation: Secondary | ICD-10-CM | POA: Diagnosis present

## 2023-11-14 DIAGNOSIS — Z888 Allergy status to other drugs, medicaments and biological substances status: Secondary | ICD-10-CM | POA: Diagnosis not present

## 2023-11-14 DIAGNOSIS — J9622 Acute and chronic respiratory failure with hypercapnia: Secondary | ICD-10-CM | POA: Diagnosis present

## 2023-11-14 DIAGNOSIS — G4730 Sleep apnea, unspecified: Secondary | ICD-10-CM | POA: Diagnosis present

## 2023-11-14 DIAGNOSIS — G4733 Obstructive sleep apnea (adult) (pediatric): Secondary | ICD-10-CM | POA: Diagnosis present

## 2023-11-14 DIAGNOSIS — Z86718 Personal history of other venous thrombosis and embolism: Secondary | ICD-10-CM

## 2023-11-14 LAB — COMPREHENSIVE METABOLIC PANEL WITH GFR
ALT: 10 U/L (ref 0–44)
AST: 13 U/L — ABNORMAL LOW (ref 15–41)
Albumin: 3.9 g/dL (ref 3.5–5.0)
Alkaline Phosphatase: 58 U/L (ref 38–126)
BUN: 13 mg/dL (ref 8–23)
CO2: >45 mmol/L — ABNORMAL HIGH (ref 22–32)
Calcium: 9.1 mg/dL (ref 8.9–10.3)
Chloride: 84 mmol/L — ABNORMAL LOW (ref 98–111)
Creatinine, Ser: 0.69 mg/dL (ref 0.44–1.00)
GFR, Estimated: >60 mL/min (ref >60)
Glucose, Bld: 172 mg/dL — ABNORMAL HIGH (ref 70–99)
Potassium: 3.9 mmol/L (ref 3.5–5.1)
Sodium: 142 mmol/L (ref 135–145)
Total Bilirubin: 0.9 mg/dL (ref 0.0–1.2)
Total Protein: 7.4 g/dL (ref 6.5–8.1)

## 2023-11-14 LAB — CBC WITH DIFFERENTIAL/PLATELET
Abs Immature Granulocytes: 0.02 10*3/uL (ref 0.00–0.07)
Basophils Absolute: 0 10*3/uL (ref 0.0–0.1)
Basophils Relative: 0 %
Eosinophils Absolute: 0.1 10*3/uL (ref 0.0–0.5)
Eosinophils Relative: 1 %
HCT: 50.2 % — ABNORMAL HIGH (ref 36.0–46.0)
Hemoglobin: 15 g/dL (ref 12.0–15.0)
Immature Granulocytes: 0 %
Lymphocytes Relative: 21 %
Lymphs Abs: 1.2 10*3/uL (ref 0.7–4.0)
MCH: 28.4 pg (ref 26.0–34.0)
MCHC: 29.9 g/dL — ABNORMAL LOW (ref 30.0–36.0)
MCV: 94.9 fL (ref 80.0–100.0)
Monocytes Absolute: 0.4 10*3/uL (ref 0.1–1.0)
Monocytes Relative: 6 %
Neutro Abs: 4.1 10*3/uL (ref 1.7–7.7)
Neutrophils Relative %: 72 %
Platelets: 229 10*3/uL (ref 150–400)
RBC: 5.29 MIL/uL — ABNORMAL HIGH (ref 3.87–5.11)
RDW: 13.7 % (ref 11.5–15.5)
WBC: 5.8 10*3/uL (ref 4.0–10.5)
nRBC: 0 % (ref 0.0–0.2)

## 2023-11-14 LAB — BLOOD GAS, VENOUS
Acid-Base Excess: 31.4 mmol/L — ABNORMAL HIGH (ref 0.0–2.0)
Bicarbonate: 65.5 mmol/L — ABNORMAL HIGH (ref 20.0–28.0)
O2 Saturation: 94.3 %
Patient temperature: 37
pCO2, Ven: 116 mmHg (ref 44–60)
pH, Ven: 7.36 (ref 7.25–7.43)
pO2, Ven: 59 mmHg — ABNORMAL HIGH (ref 32–45)

## 2023-11-14 MED ORDER — METHYLPREDNISOLONE SODIUM SUCC 125 MG IJ SOLR
125.0000 mg | INTRAMUSCULAR | Status: AC
  Administered 2023-11-14: 125 mg via INTRAVENOUS
  Filled 2023-11-14: qty 2

## 2023-11-14 NOTE — ED Triage Notes (Signed)
 Pt arrives from home w;/ c/o SHOB worsening x 3 days. Wears 3L [place] at home. 93% on home o2. Given 2 duonebs en route. Hx COPD.

## 2023-11-14 NOTE — ED Provider Notes (Addendum)
 Yvette Booth EMERGENCY DEPARTMENT AT [facility] Provider Note   CSN: [id number] Arrival date & time: 11/14/23  2048     History  Chief Complaint  Patient presents with   Shortness of Breath    Yvette Booth is a 63 y.o. female.  Patient brought in by EMS for difficulty breathing.  Patient came from home.  Patient states it really got very short of breath today.  But EMS reported that has been going on for 3 days.  Patient states she normally wears 2 to 3 L of nasal cannula at home.  93% on home oxygen.  Patient given 2 DuoNeb's and route does have a history of COPD.  Patient recently admitted April 11 through April 14 for COPD exacerbation acute on chronic respiratory failure with hypoxia and had hypercapnia.  Will check venous blood gas here.  Patient also has history of atrial fibs.  Patient is on Eliquis .  Patient states that she is on prednisone  at home but could not give me the amount.  Past medical history significant for COPD chronic hypoxic respiratory failure right heart failure atrial fibs deep vein thrombosis hypertension is ischemic stroke and hypothyroidism.  Patient still an everyday smoker.       Home Medications Prior to Admission medications   Medication Sig Start Date End Date Taking? Authorizing Provider  albuterol  (VENTOLIN  HFA) 108 (90 Base) MCG/ACT inhaler Inhale 2 puffs into the lungs every 6 (six) hours as needed for wheezing or shortness of breath.    [provider]  apixaban  (ELIQUIS ) 5 MG TABS tablet Take 5 mg by mouth 2 (two) times daily.    [provider]  benzonatate  (TESSALON ) 100 MG capsule Take 1 capsule (100 mg total) by mouth 3 (three) times daily. 10/18/23   Magdalene School, MD  fluticasone -salmeterol (ADVAIR) 250-50 MCG/ACT AEPB Inhale 1 puff into the lungs in the morning and at bedtime. 09/01/23   Ozell Blunt, MD  furosemide  (LASIX ) 40 MG tablet Take 40 mg by mouth daily.    [provider]  methimazole   (TAPAZOLE ) 10 MG tablet Take 20 mg by mouth daily.    [provider]  metoprolol  succinate (TOPROL -XL) 50 MG 24 hr tablet Take 50 mg by mouth daily. Take with or immediately following a meal.    [provider]  Nicotine  (NICODERM CQ  TD) Place 1 patch onto the skin daily as needed (for smoking cessation while hospitalized).    [provider]  OXYGEN Inhale 2 L/min into the lungs continuous.    [provider]  umeclidinium bromide  (INCRUSE ELLIPTA ) 62.5 MCG/ACT AEPB Inhale 1 puff into the lungs daily. 09/01/23   Ozell Blunt, MD      Allergies    Seroquel [quetiapine]    Review of Systems   Review of Systems  Constitutional:  Negative for chills and fever.  HENT:  Negative for ear pain and sore throat.   Eyes:  Negative for pain and visual disturbance.  Respiratory:  Positive for shortness of breath. Negative for cough.   Cardiovascular:  Negative for chest pain and palpitations.  Gastrointestinal:  Negative for abdominal pain and vomiting.  Genitourinary:  Negative for dysuria and hematuria.  Musculoskeletal:  Negative for arthralgias and back pain.  Skin:  Negative for color change and rash.  Neurological:  Negative for seizures and syncope.  All other systems reviewed and are negative.   Physical Exam Updated Vital Signs BP (!) 157/75   Pulse 74  Resp (!) 29   SpO2 99%  Physical Exam Vitals and nursing note reviewed.  Constitutional:      General: She is in acute distress.     Appearance: She is well-developed. She is ill-appearing.  HENT:     Head: Normocephalic and atraumatic.  Eyes:     Extraocular Movements: Extraocular movements intact.     Conjunctiva/sclera: Conjunctivae normal.     Pupils: Pupils are equal, round, and reactive to light.  Cardiovascular:     Rate and Rhythm: Normal rate and regular rhythm.     Heart sounds: No murmur heard. Pulmonary:     Effort: Respiratory distress present.     Breath sounds: Wheezing  present.  Abdominal:     Palpations: Abdomen is soft.     Tenderness: There is no abdominal tenderness.  Musculoskeletal:        General: No swelling.     Cervical back: Normal range of motion and neck supple.     Right lower leg: No edema.     Left lower leg: No edema.  Skin:    General: Skin is warm and dry.     Capillary Refill: Capillary refill takes less than 2 seconds.  Neurological:     General: No focal deficit present.     Mental Status: She is alert and oriented to person, place, and time. Mental status is at baseline.  Psychiatric:        Mood and Affect: Mood normal.     ED Results / Procedures / Treatments   Labs (all labs ordered are listed, but only abnormal results are displayed) Labs Reviewed  CBC WITH DIFFERENTIAL/PLATELET  COMPREHENSIVE METABOLIC PANEL WITH GFR    EKG None  Radiology No results found.  Procedures Procedures    Medications Ordered in ED Medications  methylPREDNISolone  sodium succinate (SOLU-MEDROL ) 125 mg/2 mL injection 125 mg (has no administration in time range)    ED Course/ Medical Decision Making/ A&P                                 Medical Decision Making Amount and/or Complexity of Data Reviewed Labs: ordered. Radiology: ordered.  Risk Prescription drug management.   CRITICAL CARE Performed by: Darriel Utter Total critical care time: 45 minutes Critical care time was exclusive of separately billable procedures and treating other patients. Critical care was necessary to treat or prevent imminent or life-threatening deterioration. Critical care was time spent personally by me on the following activities: development of treatment plan with patient and/or surrogate as well as nursing, discussions with consultants, evaluation of patient's response to treatment, examination of patient, obtaining history from patient or surrogate, ordering and performing treatments and interventions, ordering and review of laboratory  studies, ordering and review of radiographic studies, pulse oximetry and re-evaluation of patient's condition.  Patient's venous blood gas shows concerns.  pH 7.36 but pCO2 is 116 PO259.  On her normal 3 L of oxygen her sats are good.  Lungs are clear and wheezing is improved.  Patient still little somnolent.  But based on this we will start her on BiPAP and needs admission for respiratory failure with hypercapnia.  CBC white count 5.8 hemoglobin 15 platelets 229.  Complete metabolic panel CO2 greater than 45 GFR greater than 60 LFTs normal sodium and potassium normal.  Chest x-ray portable showed no radiographic evidence of any acute cardiopulmonary disease definitely showed signs of emphysema and questionable  pulmonary arterial hypertension.  Will start her on BiPAP.  And will contact hospitalist for admission.  Final Clinical Impression(s) / ED Diagnoses Final diagnoses:  COPD exacerbation [facility])    Rx / DC Orders ED Discharge Orders     None         Nicklas Barns, MD 11/14/23 2114    Nicklas Barns, MD 11/14/23 2303

## 2023-11-14 NOTE — Subjective & Objective (Signed)
 Patient comes in from home complaining shortness of breath for past 3 days at baseline on 3 L FiO2 Given DuoNeb and route Known history of COPD VBG showing significant hypercarbia Recent admission for COPD exacerbation from April 11 to 14 Patient is on Eliquis  for A-fib And DVT

## 2023-11-14 NOTE — H&P (Signed)
 [name] ZOX:[id number] DOB: [birth year] DOA: 11/14/2023     PCP: [facility]   Outpatient Specialists: * NONE CARDS: * Dr. None  NEphrology: *  Dr. No care team member to display  NEurology *   Dr. Pulmonary *  Dr.  Oncology * Dr.No care team member to display  GI* Dr.  Cherene Core, [facility]) No care team member to display Urology Dr. *  Patient arrived to ER on 11/14/23 at 2048 Referred by Attending Nicklas Barns, MD   Patient coming from:    home Lives alone,   *** With family   Chief Complaint:   Chief Complaint  Patient presents with   Shortness of Breath    HPI: Yvette Booth is a 63 y.o. female with medical history significant of HTN, COPD on 3 L, tobacco abuse, sleep apnea on BiPAP, RV failure, A-fib/DVT on Eliquis , HTN HLD pulmonary nodule    Presented with   worsened shortness of breath Patient comes in from home complaining shortness of breath for past 3 days at baseline on 3 L FiO2 Given DuoNeb and route Known history of COPD VBG showing significant hypercarbia Recent admission for COPD exacerbation from April 11 to 14 Patient is on Eliquis  for A-fib And DVT   Similar presentation in mid April treated with IV steroids and Levaquin  for COPD exacerbation with improvement Has required BiPAP  Denies significant ETOH intake *** Does not smoke*** but interested in quitting***  Denies marijuana use ***    Regarding pertinent Chronic problems:    ****Hyperlipidemia - *on statins {statin:315258}  Lipid Panel  No results found for: "CHOL", "TRIG", "HDL", "CHOLHDL", "VLDL", "LDLCALC", "LDLDIRECT", "LABVLDL"   HTN on metoprolol   ***chronic CHF diastolic/systolic/ combined - last echo*** No results found for this or any previous visit (from the past 81191 hours).  *** CAD  - On Aspirin, statin, betablocker, Plavix                 - *followed by cardiology                - last cardiac cath    Hyperthyroidism:   Lab Results   Component Value Date   TSH 4.116 08/31/2023   on methimazole  and synthroid      COPD - not **followed by pulmonology    on baseline oxygen3 L,      OSA -on nocturnal oxygen,Bipap     Hx of CVA - *with/out residual deficits on Aspirin 81 mg, 325, Plavix   A. Fib -   atrial fibrillation CHA2DS2 vas score  CHA2DS2/VAS  4      current  on anticoagulation with Eliquis ,       -  Rate control:  Currently controlled with Metoprolol ,    Hx of DVT/PE on - anticoagulation with Eliquis ,     While in ER:         Lab Orders         CBC with Differential/Platelet         Comprehensive metabolic panel with GFR         Blood gas, venous (at [facility])      CT HEAD *** NON acute   MRI brain  ***no acute CVA  CXR - evidence for active cardiopulmonary disease.   CTabd/pelvis - ***nonacute  CTA chest - ***nonacute, no PE, * no evidence of infiltrate  Following Medications were ordered in ER: Medications  methylPREDNISolone  sodium succinate (SOLU-MEDROL ) 125 mg/2  mL injection 125 mg (125 mg Intravenous Given 11/14/23 2120)    _______________________________________________________ ER Provider Called:       DrAaron Aas  They Recommend admit to medicine *** Will see in AM  ***SEEN in ER     ED Triage Vitals  Encounter Vitals Group     BP 11/14/23 2105 (!) 157/75     Systolic BP Percentile --      Diastolic BP Percentile --      Pulse Rate 11/14/23 2101 77     Resp 11/14/23 2101 (!) 32     Temp --      Temp src --      SpO2 11/14/23 2101 98 %     Weight --      Height --      Head Circumference --      Peak Flow --      Pain Score 11/14/23 2053 0     Pain Loc --      Pain Education --      Exclude from Growth Chart --   VQQV(95)@     _________________________________________ Significant initial  Findings: Abnormal Labs Reviewed  CBC WITH DIFFERENTIAL/PLATELET - Abnormal; Notable for the following components:      Result Value   RBC 5.29 (*)    HCT 50.2 (*)    MCHC  29.9 (*)    All other components within normal limits  COMPREHENSIVE METABOLIC PANEL WITH GFR - Abnormal; Notable for the following components:   Chloride 84 (*)    CO2 >45 (*)    Glucose, Bld 172 (*)    AST 13 (*)    All other components within normal limits  BLOOD GAS, VENOUS - Abnormal; Notable for the following components:   pCO2, Ven 116 (*)    pO2, Ven 59 (*)    Bicarbonate 65.5 (*)    Acid-Base Excess 31.4 (*)    All other components within normal limits      _________________________ Troponin ***ordered Cardiac Panel (last 3 results) No results for input(s): "CKTOTAL", "CKMB", "TROPONINIHS", "RELINDX" in the last 72 hours.   ECG: Ordered Personally reviewed and interpreted by me showing: HR : *** Rhythm: *NSR, Sinus tachycardia * A.fib. W RVR, RBBB, LBBB, Paced Ischemic changes*nonspecific changes, no evidence of ischemic changes QTC*  BNP (last 3 results) Recent Labs    08/21/23 0317 08/30/23 1415  BNP 37.7 64.7      No results for input(s): "DDIMER", "FERRITIN", "LDH", "CRP" in the last 72 hours.    ____________________ This patient meets SIRS Criteria and may be septic. SIRS = Systemic Inflammatory Response Syndrome  Order a lactic acid level if needed AND/OR Initiate the sepsis protocol with the attached order set OR Click "Treating Associated Infection or Illness" if the patient is being treated for an infection that is a known cause of these abnormalities     The recent clinical data is shown below. Vitals:   11/14/23 2101 11/14/23 2105 11/14/23 2130 11/14/23 2145  BP:  (!) 157/75 136/69 (!) 145/75  Pulse: 77 74 95 90  Resp: (!) 32 (!) 29 (!) 30 (!) 25  SpO2: 98% 99% 92% 91%        WBC     Component Value Date/Time   WBC 5.8 11/14/2023 0900   LYMPHSABS 1.2 11/14/2023 0900   MONOABS 0.4 11/14/2023 0900   EOSABS 0.1 11/14/2023 0900   BASOSABS 0.0 11/14/2023 0900        Lactic Acid, Venous No  results found for: "LATICACIDVEN"    Lactic Acid, Venous No results found for: "LATICACIDVEN"  Procalcitonin *** Ordered      UA *** no evidence of UTI  ***Pending ***not ordered   Urine analysis: No results found for: "COLORURINE", "APPEARANCEUR", "LABSPEC", "PHURINE", "GLUCOSEU", "HGBUR", "BILIRUBINUR", "KETONESUR", "PROTEINUR", "UROBILINOGEN", "NITRITE", "LEUKOCYTESUR"  Results for orders placed or performed during the hospital encounter of 10/15/23  Resp panel by RT-PCR (RSV, Flu A&B, Covid) Anterior Nasal Swab     Status: None   Collection Time: 10/15/23  4:09 AM   Specimen: Anterior Nasal Swab  Result Value Ref Range Status   SARS Coronavirus 2 by RT PCR NEGATIVE NEGATIVE Final    Comment: (NOTE) SARS-CoV-2 target nucleic acids are NOT DETECTED.  The SARS-CoV-2 RNA is generally detectable in upper respiratory specimens during the acute phase of infection. The lowest concentration of SARS-CoV-2 viral copies this assay can detect is 138 copies/mL. A negative result does not preclude SARS-Cov-2 infection and should not be used as the sole basis for treatment or other patient management decisions. A negative result may occur with  improper specimen collection/handling, submission of specimen other than nasopharyngeal swab, presence of viral mutation(s) within the areas targeted by this assay, and inadequate number of viral copies(<138 copies/mL). A negative result must be combined with clinical observations, patient history, and epidemiological information. The expected result is Negative.  Fact Sheet for Patients:  BloggerCourse.com  Fact Sheet for Healthcare Providers:  SeriousBroker.it  This test is no t yet approved or cleared by the [place]  FDA and  has been authorized for detection and/or diagnosis of SARS-CoV-2 by FDA under an Emergency Use Authorization (EUA). This EUA will remain  in effect (meaning this test can be used) for the duration of  the COVID-19 declaration under Section 564(b)(1) of the Act, 21 U.S.C.section 360bbb-3(b)(1), unless the authorization is terminated  or revoked sooner.       Influenza A by PCR NEGATIVE NEGATIVE Final   Influenza B by PCR NEGATIVE NEGATIVE Final    Comment: (NOTE) The Xpert Xpress SARS-CoV-2/FLU/RSV plus assay is intended as an aid in the diagnosis of influenza from Nasopharyngeal swab specimens and should not be used as a sole basis for treatment. Nasal washings and aspirates are unacceptable for Xpert Xpress SARS-CoV-2/FLU/RSV testing.  Fact Sheet for Patients: BloggerCourse.com  Fact Sheet for Healthcare Providers: SeriousBroker.it  This test is not yet approved or cleared by the [place]  FDA and has been authorized for detection and/or diagnosis of SARS-CoV-2 by FDA under an Emergency Use Authorization (EUA). This EUA will remain in effect (meaning this test can be used) for the duration of the COVID-19 declaration under Section 564(b)(1) of the Act, 21 U.S.C. section 360bbb-3(b)(1), unless the authorization is terminated or revoked.     Resp Syncytial Virus by PCR NEGATIVE NEGATIVE Final    Comment: (NOTE) Fact Sheet for Patients: BloggerCourse.com  Fact Sheet for Healthcare Providers: SeriousBroker.it  This test is not yet approved or cleared by the [place]  FDA and has been authorized for detection and/or diagnosis of SARS-CoV-2 by FDA under an Emergency Use Authorization (EUA). This EUA will remain in effect (meaning this test can be used) for the duration of the COVID-19 declaration under Section 564(b)(1) of the Act, 21 U.S.C. section 360bbb-3(b)(1), unless the authorization is terminated or revoked.  Performed at [facility], [address], [place], [place] [address]   MRSA Next Gen by PCR, Nasal     Status: Abnormal  Collection Time: 10/15/23  6:42 AM   Specimen: Nasal Mucosa; Nasal Swab  Result Value Ref Range Status   MRSA by PCR Next Gen DETECTED (A) NOT DETECTED Final    Comment: RESULT CALLED TO, READ BACK BY AND VERIFIED WITH: PHILLIPE, A. AT 1610 ON 10/15/2023 BY JE (NOTE) The GeneXpert MRSA Assay (FDA approved for NASAL specimens only), is one component of a comprehensive MRSA colonization surveillance program. It is not intended to diagnose MRSA infection nor to guide or monitor treatment for MRSA infections. Test performance is not FDA approved in patients less than 94 years old. Performed at [facility], [address] [place], [place] [address]     ABX started Antibiotics Given (last 72 hours)     None        No results found for the last 90 days.    ________________________________________________________________  Arterial ***Venous  Blood Gas result:  pH *** pCO2 ***; pO2 ***;     %O2 Sat ***.  ABG    Component Value Date/Time   PHART 7.32 (L) 08/30/2023 1337   PCO2ART 119 (HH) 08/30/2023 1337   PO2ART 67 (L) 08/30/2023 1337   HCO3 65.5 (H) 11/14/2023 2210   O2SAT 94.3 11/14/2023 2210       __________________________________________________________ Recent Labs  Lab 11/14/23 0900  NA 142  K 3.9  CO2 >45*  GLUCOSE 172*  BUN 13  CREATININE 0.69  CALCIUM  9.1    Cr  * stable,  Up from baseline see below Lab Results  Component Value Date   CREATININE 0.69 11/14/2023   CREATININE 0.72 10/18/2023   CREATININE 0.75 10/16/2023    Recent Labs  Lab 11/14/23 0900  AST 13*  ALT 10  ALKPHOS 58  BILITOT 0.9  PROT 7.4  ALBUMIN 3.9   Lab Results  Component Value Date   CALCIUM  9.1 11/14/2023   PHOS 3.6 10/16/2023          Plt: Lab Results  Component Value Date   PLT 229 11/14/2023         Recent Labs  Lab 11/14/23 0900  WBC 5.8  NEUTROABS 4.1  HGB 15.0  HCT 50.2*  MCV 94.9  PLT 229    HG/HCT * stable,  Down *Up  from baseline see below    Component Value Date/Time   HGB 15.0 11/14/2023 0900   HCT 50.2 (H) 11/14/2023 0900   MCV 94.9 11/14/2023 0900      No results for input(s): "LIPASE", "AMYLASE" in the last 168 hours. No results for input(s): "AMMONIA" in the last 168 hours.    _______________________________________________ Hospitalist was called for admission for *** COPD exacerbation (HCC) ***  Acute on chronic respiratory failure with hypercapnia (HCC) ***    The following Work up has been ordered so far:  Orders Placed This Encounter  Procedures   DG Chest Port 1 View   CBC with Differential/Platelet   Comprehensive metabolic panel with GFR   Blood gas, venous (at [facility])   ED Cardiac monitoring   Consult to hospitalist   Bipap   EKG 12-Lead   ED EKG   Saline lock IV     OTHER Significant initial  Findings:  labs showing:     DM  labs:  HbA1C: Recent Labs    08/21/23 1019  HGBA1C 5.1       CBG (last 3)  No results for input(s): "GLUCAP" in the last 72 hours.  Cultures: No results found for: "SDES", "SPECREQUEST", "CULT", "REPTSTATUS"   Radiological Exams on Admission: DG Chest Port 1 View Result Date: 11/14/2023 CLINICAL DATA:  Initial evaluation for acute respiratory distress. EXAM: PORTABLE CHEST 1 VIEW COMPARISON:  Prior radiograph from 10/15/2023 FINDINGS: Transverse heart size stable, and remains within normal limits. Mediastinal silhouette within normal limits. Aortic atherosclerosis. Lungs are normally inflated. Attenuation of the pulmonary markings superiorly, consistent with emphysema. Chronic coarsening of the interstitial markings at the lung bases, stable. No other focal airspace disease. No pulmonary edema or pleural effusion. No pneumothorax. Prominence of the main pulmonary arteries noted bilaterally. Visualized soft tissues and osseous structures demonstrate no acute finding. IMPRESSION: 1. No radiographic evidence for active  cardiopulmonary disease. 2.  Emphysema (ICD10-J43.9). 3. Prominence of the main pulmonary arteries bilaterally, which can be seen with pulmonary arterial hypertension. Electronically Signed   By: Virgia Griffins M.D.   On: 11/14/2023 22:34   _______________________________________________________________________________________________________ Latest  Blood pressure (!) 145/75, pulse 90, resp. rate (!) 25, SpO2 91%.   Vitals  labs and radiology finding personally reviewed  Review of Systems:    Pertinent positives include: ***  Constitutional:  No weight loss, night sweats, Fevers, chills, fatigue, weight loss  HEENT:  No headaches, Difficulty swallowing,Tooth/dental problems,Sore throat,  No sneezing, itching, ear ache, nasal congestion, post nasal drip,  Cardio-vascular:  No chest pain, Orthopnea, PND, anasarca, dizziness, palpitations.no Bilateral lower extremity swelling  GI:  No heartburn, indigestion, abdominal pain, nausea, vomiting, diarrhea, change in bowel habits, loss of appetite, melena, blood in stool, hematemesis Resp:  no shortness of breath at rest. No dyspnea on exertion, No excess mucus, no productive cough, No non-productive cough, No coughing up of blood.No change in color of mucus.No wheezing. Skin:  no rash or lesions. No jaundice GU:  no dysuria, change in color of urine, no urgency or frequency. No straining to urinate.  No flank pain.  Musculoskeletal:  No joint pain or no joint swelling. No decreased range of motion. No back pain.  Psych:  No change in mood or affect. No depression or anxiety. No memory loss.  Neuro: no localizing neurological complaints, no tingling, no weakness, no double vision, no gait abnormality, no slurred speech, no confusion  All systems reviewed and apart from HOPI all are negative _______________________________________________________________________________________________ Past Medical History:   Past Medical History:   Diagnosis Date   A-fib (HCC)    Chronic hypercapnic respiratory failure (HCC)    BIPAP at night   Chronic hypoxic respiratory failure (HCC)    COPD (chronic obstructive pulmonary disease) (HCC)    DVT (deep venous thrombosis) (HCC)    Hypertension    Hyperthyroidism    Ischemic stroke (HCC)    OSA (obstructive sleep apnea)    RVF (right ventricular failure) (HCC)       History reviewed. No pertinent surgical history.  Social History:  Ambulatory *** independently cane, walker  wheelchair bound, bed bound     reports that she has been smoking cigarettes. She does not have any smokeless tobacco history on file. She reports that she does not currently use alcohol. She reports that she does not use drugs.     Family History: *** History reviewed. No pertinent family history. ______________________________________________________________________________________________ Allergies: Allergies  Allergen Reactions   Seroquel [Quetiapine] Other (See Comments)    Delirium and confusion      Prior to Admission medications   Medication Sig Start Date End Date Taking? Authorizing Provider  albuterol  (VENTOLIN  HFA) 108 (  90 Base) MCG/ACT inhaler Inhale 2 puffs into the lungs every 6 (six) hours as needed for wheezing or shortness of breath.    [provider]  apixaban  (ELIQUIS ) 5 MG TABS tablet Take 5 mg by mouth 2 (two) times daily.    [provider]  benzonatate  (TESSALON ) 100 MG capsule Take 1 capsule (100 mg total) by mouth 3 (three) times daily. 10/18/23   Magdalene School, MD  fluticasone -salmeterol (ADVAIR) 250-50 MCG/ACT AEPB Inhale 1 puff into the lungs in the morning and at bedtime. 09/01/23   Ozell Blunt, MD  furosemide  (LASIX ) 40 MG tablet Take 40 mg by mouth daily.    [provider]  methimazole  (TAPAZOLE ) 10 MG tablet Take 20 mg by mouth daily.    [provider]  metoprolol  succinate (TOPROL -XL) 50 MG 24 hr tablet Take 50 mg by mouth  daily. Take with or immediately following a meal.    [provider]  Nicotine  (NICODERM CQ  TD) Place 1 patch onto the skin daily as needed (for smoking cessation while hospitalized).    [provider]  OXYGEN Inhale 2 L/min into the lungs continuous.    [provider]  umeclidinium bromide  (INCRUSE ELLIPTA ) 62.5 MCG/ACT AEPB Inhale 1 puff into the lungs daily. 09/01/23   Ozell Blunt, MD    ___________________________________________________________________________________________________ Physical Exam:    11/14/2023    9:45 PM 11/14/2023    9:30 PM 11/14/2023    9:05 PM  Vitals with BMI  Systolic 145 136 161  Diastolic 75 69 75  Pulse 90 95 74     1. General:  in No ***Acute distress***increased work of breathing ***complaining of severe pain****agitated * Chronically ill *well *cachectic *toxic acutely ill -appearing 2. Psychological: Alert and *** Oriented 3. Head/ENT:   Moist *** Dry Mucous Membranes                          Head Non traumatic, neck supple                          Normal *** Poor Dentition 4. SKIN: normal *** decreased Skin turgor,  Skin clean Dry and intact no rash    5. Heart: Regular rate and rhythm no*** Murmur, no Rub or gallop 6. Lungs: ***Clear to auscultation bilaterally, no wheezes or crackles   7. Abdomen: Soft, ***non-tender, Non distended *** obese ***bowel sounds present 8. Lower extremities: no clubbing, cyanosis, no ***edema 9. Neurologically Grossly intact, moving all 4 extremities equally *** strength 5 out of 5 in all 4 extremities cranial nerves II through XII intact 10. MSK: Normal range of motion    Chart has been reviewed  ______________________________________________________________________________________________  Assessment/Plan  ***  Admitted for *** COPD exacerbation (HCC) ***  Acute on chronic respiratory failure with hypercapnia (HCC) ***    Present on Admission: **None**     No  problem-specific Assessment & Plan notes found for this encounter.    Other plan as per orders.  DVT prophylaxis:  SCD *** Lovenox       Code Status:    Code Status: Prior FULL CODE *** DNR/DNI ***comfort care as per patient ***family  I had personally discussed CODE STATUS with patient and family*  ACP *** none has been reviewed ***   Family Communication:   Family not at  Bedside  plan of care was discussed on the phone with *** Son, Daughter,  Wife, Husband, Sister, Brother , father, mother  Diet    Disposition Plan:   *** likely will need placement for rehabilitation                          Back to current facility when stable                            To home once workup is complete and patient is stable  ***Following barriers for discharge:                             Chest pain *** Stroke *** Syncope ***work up is complete                            Electrolytes corrected                               Anemia corrected h/H stable                             Pain controlled with PO medications                               Afebrile, white count improving able to transition to PO antibiotics                             Will need to be able to tolerate PO                            Will likely need home health, home O2, set up                           Will need consultants to evaluate patient prior to discharge                           Work of breathing improves       Consult Orders  (From admission, onward)           Start     Ordered   11/14/23 2304  Consult to hospitalist  Once       Provider:  (Not yet assigned)  Question Answer Comment  Place call to: [id number] respiratory failure with hypercapnia started on BiPAP.  COPD exacerbation   Reason for Consult Admit      11/14/23 2303                              ***Would benefit from PT/OT eval prior to DC  Ordered                   Swallow eval - SLP ordered                   Diabetes  care coordinator                   Transition of care consulted  Nutrition    consulted                  Wound care  consulted                   Palliative care    consulted                   Behavioral health  consulted                    Consults called: ***     Admission status:  ED Disposition     ED Disposition  Admit   Condition  --   Comment  The patient appears reasonably stabilized for admission considering the current resources, flow, and capabilities available in the ED at this time, and I doubt any other [facility] requiring further screening and/or treatment in the ED prior to admission is  present.           Obs***  ***  inpatient     I Expect 2 midnight stay secondary to severity of patient's current illness need for inpatient interventions justified by the following: ***hemodynamic instability despite optimal treatment (tachycardia *hypotension * tachypnea *hypoxia, hypercapnia) *** Severe lab/radiological/exam abnormalities including:    COPD exacerbation (HCC) ***  Acute on chronic respiratory failure with hypercapnia (HCC) ***  and extensive comorbidities including: *substance abuse  *Chronic pain *DM2  * CHF * CAD  * COPD/asthma *Morbid Obesity * CKD *dementia *liver disease *history of stroke with residual deficits *  malignancy, * sickle cell disease  History of amputation Chronic anticoagulation  That are currently affecting medical management.   I expect  patient to be hospitalized for 2 midnights requiring inpatient medical care.  Patient is at high risk for adverse outcome (such as loss of life or disability) if not treated.  Indication for inpatient stay as follows:  Severe change from baseline regarding mental status Hemodynamic instability despite maximal medical therapy,  severe pain requiring acute inpatient management,  inability to maintain oral hydration   persistent chest pain despite medical  management Need for operative/procedural  intervention New or worsening hypoxia ongoing suicidal ideations   Need for IV antibiotics, IV fluids, IV rate controling medications, IV antihypertensives, IV pain medications, IV anticoagulation, need for biPAP    Level of care   *** tele  For 12H 24H     medical floor       progressive     stepdown   tele indefinitely please discontinue once patient no longer qualifies COVID-19 Labs    Critical***  Patient is critically ill due to  hemodynamic instability * respiratory failure *severe sepsis* ongoing chest pain*  They are at high risk for life/limb threatening clinical deterioration requiring frequent reassessment and modifications of care.  Services provided include examination of the patient, review of relevant ancillary tests, prescription of lifesaving therapies, review of medications and prophylactic therapy.  Total critical care time excluding separately billable procedures: 60*  Minutes.    Micaiah Remillard 11/14/2023, 11:18 PM ***  [facility]     after 2 AM please page floor coverage   If 7AM-7PM, please contact the day team taking care of the patient using [email or web address]

## 2023-11-15 DIAGNOSIS — J441 Chronic obstructive pulmonary disease with (acute) exacerbation: Secondary | ICD-10-CM | POA: Diagnosis not present

## 2023-11-15 DIAGNOSIS — Z720 Tobacco use: Secondary | ICD-10-CM | POA: Diagnosis present

## 2023-11-15 LAB — BLOOD GAS, VENOUS
Acid-Base Excess: 27.1 mmol/L — ABNORMAL HIGH (ref 0.0–2.0)
Bicarbonate: 59.6 mmol/L — ABNORMAL HIGH (ref 20.0–28.0)
O2 Saturation: 90.6 %
Patient temperature: 36.7
pCO2, Ven: 106 mmHg (ref 44–60)
pH, Ven: 7.35 (ref 7.25–7.43)
pO2, Ven: 52 mmHg — ABNORMAL HIGH (ref 32–45)

## 2023-11-15 LAB — CBC
HCT: 48.8 % — ABNORMAL HIGH (ref 36.0–46.0)
Hemoglobin: 15.4 g/dL — ABNORMAL HIGH (ref 12.0–15.0)
MCH: 28.4 pg (ref 26.0–34.0)
MCHC: 31.6 g/dL (ref 30.0–36.0)
MCV: 90 fL (ref 80.0–100.0)
Platelets: 181 10*3/uL (ref 150–400)
RBC: 5.42 MIL/uL — ABNORMAL HIGH (ref 3.87–5.11)
RDW: 13.8 % (ref 11.5–15.5)
WBC: 6.4 10*3/uL (ref 4.0–10.5)
nRBC: 0 % (ref 0.0–0.2)

## 2023-11-15 LAB — AMMONIA: Ammonia: 58 umol/L — ABNORMAL HIGH (ref 9–35)

## 2023-11-15 LAB — RESPIRATORY PANEL BY PCR

## 2023-11-15 LAB — EXPECTORATED SPUTUM ASSESSMENT W GRAM STAIN, RFLX TO RESP C

## 2023-11-15 LAB — PHOSPHORUS
Phosphorus: 4.2 mg/dL (ref 2.5–4.6)
Phosphorus: 4.1 mg/dL (ref 2.5–4.6)

## 2023-11-15 LAB — COMPREHENSIVE METABOLIC PANEL WITH GFR
ALT: 9 U/L (ref 0–44)
AST: 13 U/L — ABNORMAL LOW (ref 15–41)
Albumin: 3.7 g/dL (ref 3.5–5.0)
Alkaline Phosphatase: 57 U/L (ref 38–126)
Anion gap: 10 (ref 5–15)
BUN: 19 mg/dL (ref 8–23)
CO2: 44 mmol/L — ABNORMAL HIGH (ref 22–32)
Calcium: 9.3 mg/dL (ref 8.9–10.3)
Chloride: 89 mmol/L — ABNORMAL LOW (ref 98–111)
Creatinine, Ser: 0.58 mg/dL (ref 0.44–1.00)
GFR, Estimated: >60 mL/min (ref >60)
Glucose, Bld: 180 mg/dL — ABNORMAL HIGH (ref 70–99)
Potassium: 4.1 mmol/L (ref 3.5–5.1)
Sodium: 143 mmol/L (ref 135–145)
Total Bilirubin: 0.5 mg/dL (ref 0.0–1.2)
Total Protein: 6.6 g/dL (ref 6.5–8.1)

## 2023-11-15 LAB — CK: Total CK: 35 U/L — ABNORMAL LOW (ref 38–234)

## 2023-11-15 LAB — MAGNESIUM
Magnesium: 2.4 mg/dL (ref 1.7–2.4)
Magnesium: 2.3 mg/dL (ref 1.7–2.4)

## 2023-11-15 LAB — RESP PANEL BY RT-PCR (RSV, FLU A&B, COVID)  RVPGX2
Influenza A by PCR: NEGATIVE
Influenza B by PCR: NEGATIVE
Resp Syncytial Virus by PCR: NEGATIVE
SARS Coronavirus 2 by RT PCR: NEGATIVE

## 2023-11-15 LAB — MRSA NEXT GEN BY PCR, NASAL: MRSA by PCR Next Gen: DETECTED — AB

## 2023-11-15 LAB — T4, FREE: Free T4: 0.75 ng/dL (ref 0.61–1.12)

## 2023-11-15 LAB — TSH: TSH: 3.565 u[IU]/mL (ref 0.350–4.500)

## 2023-11-15 MED ORDER — METOPROLOL SUCCINATE ER 50 MG PO TB24
50.0000 mg | ORAL_TABLET | Freq: Every day | ORAL | Status: DC
Start: 2023-11-16 — End: 2023-11-17
  Administered 2023-11-16 – 2023-11-17 (×2): 50 mg via ORAL
  Filled 2023-11-15: qty 2
  Filled 2023-11-15: qty 1

## 2023-11-15 MED ORDER — SODIUM CHLORIDE 0.9 % IV SOLN
500.0000 mg | INTRAVENOUS | Status: AC
  Administered 2023-11-15: 500 mg via INTRAVENOUS
  Filled 2023-11-15: qty 5

## 2023-11-15 MED ORDER — HYDRALAZINE HCL 20 MG/ML IJ SOLN: 10.0000 mg | Freq: Four times a day (QID) | INTRAMUSCULAR | Status: DC | PRN

## 2023-11-15 MED ORDER — APIXABAN 5 MG PO TABS
5.0000 mg | ORAL_TABLET | Freq: Two times a day (BID) | ORAL | Status: DC
  Administered 2023-11-15 – 2023-11-17 (×5): 5 mg via ORAL
  Filled 2023-11-15 (×5): qty 1

## 2023-11-15 MED ORDER — HYDROCODONE-ACETAMINOPHEN 5-325 MG PO TABS
1.0000 | ORAL_TABLET | ORAL | Status: DC | PRN
Start: 2023-11-15 — End: 2023-11-17

## 2023-11-15 MED ORDER — ONDANSETRON HCL 4 MG/2ML IJ SOLN
4.0000 mg | Freq: Four times a day (QID) | INTRAMUSCULAR | Status: DC | PRN
Start: 2023-11-15 — End: 2023-11-17

## 2023-11-15 MED ORDER — HYDRALAZINE HCL 25 MG PO TABS: 25.0000 mg | ORAL_TABLET | Freq: Four times a day (QID) | ORAL | Status: DC | PRN

## 2023-11-15 MED ORDER — ALBUTEROL SULFATE (2.5 MG/3ML) 0.083% IN NEBU: 2.5000 mg | INHALATION_SOLUTION | RESPIRATORY_TRACT | Status: DC | PRN

## 2023-11-15 MED ORDER — BISOPROLOL FUMARATE 5 MG PO TABS
5.0000 mg | ORAL_TABLET | Freq: Every day | ORAL | Status: DC
  Administered 2023-11-15: 5 mg via ORAL
  Filled 2023-11-15: qty 1

## 2023-11-15 MED ORDER — METHYLPREDNISOLONE SODIUM SUCC 40 MG IJ SOLR
40.0000 mg | Freq: Two times a day (BID) | INTRAMUSCULAR | Status: AC
  Administered 2023-11-15 (×2): 40 mg via INTRAVENOUS
  Filled 2023-11-15 (×2): qty 1

## 2023-11-15 MED ORDER — AZITHROMYCIN 250 MG PO TABS
500.0000 mg | ORAL_TABLET | Freq: Every day | ORAL | Status: AC
  Administered 2023-11-16 – 2023-11-17 (×2): 500 mg via ORAL
  Filled 2023-11-15 (×2): qty 2

## 2023-11-15 MED ORDER — METHIMAZOLE 10 MG PO TABS
20.0000 mg | ORAL_TABLET | Freq: Every day | ORAL | Status: DC
  Administered 2023-11-15 – 2023-11-17 (×3): 20 mg via ORAL
  Filled 2023-11-15 (×3): qty 2

## 2023-11-15 MED ORDER — ONDANSETRON HCL 4 MG PO TABS: 4.0000 mg | ORAL_TABLET | Freq: Four times a day (QID) | ORAL | Status: DC | PRN

## 2023-11-15 MED ORDER — ACETAMINOPHEN 325 MG PO TABS: 650.0000 mg | ORAL_TABLET | Freq: Four times a day (QID) | ORAL | Status: DC | PRN

## 2023-11-15 MED ORDER — CHLORHEXIDINE GLUCONATE CLOTH 2 % EX PADS: 6.0000 | MEDICATED_PAD | Freq: Every day | CUTANEOUS | Status: DC

## 2023-11-15 MED ORDER — PREDNISONE 20 MG PO TABS
40.0000 mg | ORAL_TABLET | Freq: Every day | ORAL | Status: DC
  Administered 2023-11-16 – 2023-11-17 (×2): 40 mg via ORAL
  Filled 2023-11-15 (×3): qty 2

## 2023-11-15 MED ORDER — NICOTINE 14 MG/24HR TD PT24
14.0000 mg | MEDICATED_PATCH | Freq: Every day | TRANSDERMAL | Status: DC
  Administered 2023-11-15 – 2023-11-17 (×3): 14 mg via TRANSDERMAL
  Filled 2023-11-15 (×3): qty 1

## 2023-11-15 MED ORDER — SODIUM CHLORIDE 0.9% FLUSH: 3.0000 mL | INTRAVENOUS | Status: DC | PRN

## 2023-11-15 MED ORDER — SODIUM CHLORIDE 0.9 % IV SOLN: 250.0000 mL | INTRAVENOUS | Status: AC | PRN

## 2023-11-15 MED ORDER — SODIUM CHLORIDE 0.9% FLUSH
3.0000 mL | Freq: Two times a day (BID) | INTRAVENOUS | Status: DC
  Administered 2023-11-15 – 2023-11-17 (×5): 3 mL via INTRAVENOUS

## 2023-11-15 MED ORDER — FUROSEMIDE 40 MG PO TABS
40.0000 mg | ORAL_TABLET | Freq: Every day | ORAL | Status: DC
  Administered 2023-11-15 – 2023-11-17 (×3): 40 mg via ORAL
  Filled 2023-11-15 (×4): qty 1

## 2023-11-15 MED ORDER — IPRATROPIUM-ALBUTEROL 0.5-2.5 (3) MG/3ML IN SOLN
3.0000 mL | Freq: Four times a day (QID) | RESPIRATORY_TRACT | Status: DC
  Administered 2023-11-15 – 2023-11-17 (×9): 3 mL via RESPIRATORY_TRACT
  Filled 2023-11-15 (×9): qty 3

## 2023-11-15 MED ORDER — ACETAMINOPHEN 650 MG RE SUPP: 650.0000 mg | Freq: Four times a day (QID) | RECTAL | Status: DC | PRN

## 2023-11-15 NOTE — Assessment & Plan Note (Signed)
 Continue BiPAP.

## 2023-11-15 NOTE — Assessment & Plan Note (Signed)
 Started on BiPAP repeat VBG

## 2023-11-15 NOTE — Assessment & Plan Note (Signed)
 Check TSH T3 and T4 for now continue Tapazole  20 mg daily

## 2023-11-15 NOTE — Assessment & Plan Note (Signed)
-   Spoke about importance of quitting spent 5 minutes discussing options for treatment, prior attempts at quitting, and dangers of smoking  -At this point patient is    interested in quitting  - ordered nicotine  patch   - nursing tobacco cessation protocol

## 2023-11-15 NOTE — Assessment & Plan Note (Signed)
-  -   Will initiate: Steroid taper  -  Antibiotics   Doxycycline, - Albuterol   PRN, - scheduled duoneb,  -  Breo or Dulera at discharge   -  Mucinex.  Titrate O2 to saturation >90%. Follow patients respiratory status.     VBG showing compensated hypercarbia patient has not been compliant with BiPAP    -   BiPAP ordered    Currently mentating well no evidence of symptomatic hypercarbia

## 2023-11-15 NOTE — Progress Notes (Signed)
 PROGRESS NOTE    Yvette Booth  ZOX:[id number] DOB: 01-23-1961 DOA: 11/14/2023 PCP: [facility]   Brief Narrative: Yvette Booth is a 63 y.o. female with a history of hypertension, COPD, chronic respiratory failure with hypoxia and hypercapnia on 3 L/min of oxygen and NIV at night, RV failure, atrial fibrillation, DVT, hyperlipidemia.  Patient presented secondary to shortness of breath with evidence of a COPD exacerbation in addition to respiratory failure requiring BiPAP. Patient started on breathing treatments, steroids and doxycycline. BiPAP started for management of respiratory failure with associated chronic hypercapnia.   Assessment and Plan:  COPD exacerbation Present on admission. Unclear trigger. Patient started on steroids, bronchodilators and doxycycline. -Continue Solu-medrol , bronchodilators and doxycycline -Incentive spirometer  Acute on chronic respiratory failure with hypoxia Chronic respiratory failure with hypercapnia Patient follows with pulmonology as an outpatient. Patient is on chronic oxygen and uses NIV at home, but admits she uses it inconsistently. -Continue oxygen at 3 L/min -Continue BiPAP at bedtime -Ambulatory pulse ox prior to discharge  Paroxysmal atrial fibrillation Rate control, in sinus rhythm. Patient is on Eliquis  as an outpateint. -Continue Eliquis   History of CVA -Continue Eliquis   Hyperthyroidism -Continue Tapazole   OSA Patient uses non-invasive ventilation overnight. -Continue BiPAP qHS  Tobacco abuse Counseled on admission. -Continue nicotine  patch  Primary hypertension Patient is on metoprolol  as an outpatient, which was held on admission. Patient started on bisoprolol. -Discontinue bisoprolol and restart home metoprolol   History of DVT -Continue Eliquis    DVT prophylaxis: Eliquis  Code Status:   Code Status: Full Code Family Communication: None at bedside Disposition Plan: Discharge home likely  in 1-2 days pending stable respiratory status, mobility   Consultants:  None  Procedures:  None  Antimicrobials: Doxycycline    Subjective: Patient reports feeling similar to admission, but maybe a bit better. No other concerns.  Objective: BP (!) 155/116   Pulse 70   Temp 97.9 F (36.6 C) (Oral)   Resp (!) 21   Ht 5\' 3"  (1.6 m)   Wt 75.2 kg   SpO2 98%   BMI 29.37 kg/m   Examination:  General exam: Appears calm and comfortable on BiPAP Respiratory system: Clear to auscultation. Slight tachypnea. Cardiovascular system: S1 & S2 heard, RRR. No murmurs, rubs, gallops or clicks. Gastrointestinal system: Abdomen is nondistended, soft and nontender. Normal bowel sounds heard. Central nervous system: Alert and oriented. No focal neurological deficits. Musculoskeletal: No edema. No calf tenderness Psychiatry: Judgement and insight appear normal. Mood & affect appropriate.    Data Reviewed: I have personally reviewed following labs and imaging studies  CBC Lab Results  Component Value Date   WBC 5.8 11/14/2023   RBC 5.29 (H) 11/14/2023   HGB 15.0 11/14/2023   HCT 50.2 (H) 11/14/2023   MCV 94.9 11/14/2023   MCH 28.4 11/14/2023   PLT 229 11/14/2023   MCHC 29.9 (L) 11/14/2023   RDW 13.7 11/14/2023   LYMPHSABS 1.2 11/14/2023   MONOABS 0.4 11/14/2023   EOSABS 0.1 11/14/2023   BASOSABS 0.0 11/14/2023     Last metabolic panel Lab Results  Component Value Date   NA 142 11/14/2023   K 3.9 11/14/2023   CL 84 (L) 11/14/2023   CO2 >45 (H) 11/14/2023   BUN 13 11/14/2023   CREATININE 0.69 11/14/2023   GLUCOSE 172 (H) 11/14/2023   GFRNONAA >60 11/14/2023   GFRAA >60 12/29/2018   CALCIUM  9.1 11/14/2023   PHOS 4.2 11/15/2023   PROT 7.4 11/14/2023   ALBUMIN  3.9 11/14/2023   BILITOT 0.9 11/14/2023   ALKPHOS 58 11/14/2023   AST 13 (L) 11/14/2023   ALT 10 11/14/2023   ANIONGAP NOT CALCULATED 11/14/2023    GFR: Estimated Creatinine Clearance: 69.9 mL/min (by C-G  formula based on SCr of 0.69 mg/dL).  Recent Results (from the past 240 hours)  Respiratory (~20 pathogens) panel by PCR     Status: None   Collection Time: 11/14/23 11:38 PM   Specimen: Nasopharyngeal Swab; Respiratory  Result Value Ref Range Status   Adenovirus NOT DETECTED NOT DETECTED Final   Coronavirus 229E NOT DETECTED NOT DETECTED Final    Comment: (NOTE) The Coronavirus on the Respiratory Panel, DOES NOT test for the novel  Coronavirus (2019 nCoV)    Coronavirus HKU1 NOT DETECTED NOT DETECTED Final   Coronavirus NL63 NOT DETECTED NOT DETECTED Final   Coronavirus OC43 NOT DETECTED NOT DETECTED Final   Metapneumovirus NOT DETECTED NOT DETECTED Final   Rhinovirus / Enterovirus NOT DETECTED NOT DETECTED Final   Influenza A NOT DETECTED NOT DETECTED Final   Influenza B NOT DETECTED NOT DETECTED Final   Parainfluenza Virus 1 NOT DETECTED NOT DETECTED Final   Parainfluenza Virus 2 NOT DETECTED NOT DETECTED Final   Parainfluenza Virus 3 NOT DETECTED NOT DETECTED Final   Parainfluenza Virus 4 NOT DETECTED NOT DETECTED Final   Respiratory Syncytial Virus NOT DETECTED NOT DETECTED Final   Bordetella pertussis NOT DETECTED NOT DETECTED Final   Bordetella Parapertussis NOT DETECTED NOT DETECTED Final   Chlamydophila pneumoniae NOT DETECTED NOT DETECTED Final   Mycoplasma pneumoniae NOT DETECTED NOT DETECTED Final    Comment: Performed at [facility], [address], [place], [place] [address]  Resp panel by RT-PCR (RSV, Flu A&B, Covid) Anterior Nasal Swab     Status: None   Collection Time: 11/14/23 11:38 PM   Specimen: Anterior Nasal Swab  Result Value Ref Range Status   SARS Coronavirus 2 by RT PCR NEGATIVE NEGATIVE Final    Comment: (NOTE) SARS-CoV-2 target nucleic acids are NOT DETECTED.  The SARS-CoV-2 RNA is generally detectable in upper respiratory specimens during the acute phase of infection. The lowest concentration of SARS-CoV-2 viral copies this assay can detect  is 138 copies/mL. A negative result does not preclude SARS-Cov-2 infection and should not be used as the sole basis for treatment or other patient management decisions. A negative result may occur with  improper specimen collection/handling, submission of specimen other than nasopharyngeal swab, presence of viral mutation(s) within the areas targeted by this assay, and inadequate number of viral copies(<138 copies/mL). A negative result must be combined with clinical observations, patient history, and epidemiological information. The expected result is Negative.  Fact Sheet for Patients:  BloggerCourse.com  Fact Sheet for Healthcare Providers:  SeriousBroker.it  This test is no t yet approved or cleared by the [place]  FDA and  has been authorized for detection and/or diagnosis of SARS-CoV-2 by FDA under an Emergency Use Authorization (EUA). This EUA will remain  in effect (meaning this test can be used) for the duration of the COVID-19 declaration under Section 564(b)(1) of the Act, 21 U.S.C.section 360bbb-3(b)(1), unless the authorization is terminated  or revoked sooner.       Influenza A by PCR NEGATIVE NEGATIVE Final   Influenza B by PCR NEGATIVE NEGATIVE Final    Comment: (NOTE) The Xpert Xpress SARS-CoV-2/FLU/RSV plus assay is intended as an aid in the diagnosis of influenza from Nasopharyngeal swab specimens and should not  be used as a sole basis for treatment. Nasal washings and aspirates are unacceptable for Xpert Xpress SARS-CoV-2/FLU/RSV testing.  Fact Sheet for Patients: BloggerCourse.com  Fact Sheet for Healthcare Providers: SeriousBroker.it  This test is not yet approved or cleared by the [place]  FDA and has been authorized for detection and/or diagnosis of SARS-CoV-2 by FDA under an Emergency Use Authorization (EUA). This EUA will remain in effect  (meaning this test can be used) for the duration of the COVID-19 declaration under Section 564(b)(1) of the Act, 21 U.S.C. section 360bbb-3(b)(1), unless the authorization is terminated or revoked.     Resp Syncytial Virus by PCR NEGATIVE NEGATIVE Final    Comment: (NOTE) Fact Sheet for Patients: BloggerCourse.com  Fact Sheet for Healthcare Providers: SeriousBroker.it  This test is not yet approved or cleared by the [place]  FDA and has been authorized for detection and/or diagnosis of SARS-CoV-2 by FDA under an Emergency Use Authorization (EUA). This EUA will remain in effect (meaning this test can be used) for the duration of the COVID-19 declaration under Section 564(b)(1) of the Act, 21 U.S.C. section 360bbb-3(b)(1), unless the authorization is terminated or revoked.  Performed at [facility], [address] [place], [place] [address]   MRSA Next Gen by PCR, Nasal     Status: Abnormal   Collection Time: 11/15/23 12:45 AM   Specimen: Nasal Mucosa; Nasal Swab  Result Value Ref Range Status   MRSA by PCR Next Gen DETECTED (A) NOT DETECTED Final    Comment: (NOTE) The GeneXpert MRSA Assay (FDA approved for NASAL specimens only), is one component of a comprehensive MRSA colonization surveillance program. It is not intended to diagnose MRSA infection nor to guide or monitor treatment for MRSA infections. Test performance is not FDA approved in patients less than 11 years old. Performed at [facility], [address] [place], [place] [address]       Radiology Studies: [facility] Chest Port 1 View Result Date: 11/14/2023 CLINICAL DATA:  Initial evaluation for acute respiratory distress. EXAM: PORTABLE CHEST 1 VIEW COMPARISON:  Prior radiograph from 10/15/2023 FINDINGS: Transverse heart size stable, and remains within normal limits. Mediastinal silhouette within normal limits. Aortic  atherosclerosis. Lungs are normally inflated. Attenuation of the pulmonary markings superiorly, consistent with emphysema. Chronic coarsening of the interstitial markings at the lung bases, stable. No other focal airspace disease. No pulmonary edema or pleural effusion. No pneumothorax. Prominence of the main pulmonary arteries noted bilaterally. Visualized soft tissues and osseous structures demonstrate no acute finding. IMPRESSION: 1. No radiographic evidence for active cardiopulmonary disease. 2.  Emphysema (ICD10-J43.9). 3. Prominence of the main pulmonary arteries bilaterally, which can be seen with pulmonary arterial hypertension. Electronically Signed   By: Virgia Griffins M.D.   On: 11/14/2023 22:34      LOS: 1 day    Aneita Keens, MD [facility] 11/15/2023, 10:32 AM   If 7PM-7AM, please contact night-coverage [email or web address]

## 2023-11-15 NOTE — Hospital Course (Addendum)
 Yvette Booth is a 63 y.o. female with a history of hypertension, COPD, chronic respiratory failure with hypoxia and hypercapnia on 3 L/min of oxygen and NIV at night, RV failure, atrial fibrillation, DVT, hyperlipidemia.  Patient presented secondary to shortness of breath with evidence of a COPD exacerbation in addition to respiratory failure requiring BiPAP. Patient started on breathing treatments, steroids and doxycycline. BiPAP started for management of respiratory failure with associated chronic hypercapnia.

## 2023-11-15 NOTE — Assessment & Plan Note (Signed)
 Continue Eliquis 5 mg twice daily

## 2023-11-15 NOTE — Assessment & Plan Note (Signed)
 Continue Eliquis  5 mg p.o. twice daily change metoprolol  to Zebeta given COPD

## 2023-11-15 NOTE — Progress Notes (Signed)
   11/15/23 1538  TOC Brief Assessment  Insurance and Status Reviewed  Patient has primary care physician Yes (Zackowski, Scott, MD)  Home environment has been reviewed yes from home  Prior level of function: Independent  Prior/Current Home Services No current home services  Social Drivers of Health Review SDOH reviewed no interventions necessary  Readmission risk has been reviewed Yes  Transition of care needs no transition of care needs at this time

## 2023-11-15 NOTE — Progress Notes (Signed)
 OT Cancellation Note  Patient Details Name: Yvette Booth MRN: [id number] DOB: 09-23-1960   Cancelled Treatment:    Reason Eval/Treat Not Completed: Other (comment) Attempted to see patient this afternoon with set up for task with patient receiving phone call from insurance company. Patient reported that she needed to complete this phone call at this time thus session was stopped. OT to continue to follow and check back on 5/13.   Wynette Heckler, MS Acute Rehabilitation Department Office# [phone]  11/15/2023, 2:43 PM

## 2023-11-15 NOTE — Assessment & Plan Note (Addendum)
>>  ASSESSMENT AND PLAN FOR ACUTE ON CHRONIC RESPIRATORY FAILURE WITH HYPOXIA AND HYPERCAPNIA (HCC) WRITTEN ON 11/15/2023 12:23 AM BY DOUTOVA, ANASTASSIA, MD   this patient has acute respiratory failure with Hypoxia and  Hypercarbia as documented by the presence of following: O2 saturatio< 90% on RA    pCO2 >50  Likely due to:  COPD exacerbation,   Provide O2 therapy and titrate as needed  Continuous pulse ox   check Pulse ox with ambulation prior to discharge   may need  TC consult for home O2 set up    flutter valve ordered    >>ASSESSMENT AND PLAN FOR HYPERCAPNIA WRITTEN ON 11/15/2023 12:37 AM BY DOUTOVA, ANASTASSIA, MD  Started on BiPAP repeat VBG

## 2023-11-16 DIAGNOSIS — J441 Chronic obstructive pulmonary disease with (acute) exacerbation: Secondary | ICD-10-CM | POA: Diagnosis not present

## 2023-11-16 LAB — T3: T3, Total: 110 ng/dL (ref 71–180)

## 2023-11-16 LAB — BASIC METABOLIC PANEL WITH GFR
Anion gap: 15 (ref 5–15)
BUN: 25 mg/dL — ABNORMAL HIGH (ref 8–23)
CO2: 38 mmol/L — ABNORMAL HIGH (ref 22–32)
Calcium: 8.7 mg/dL — ABNORMAL LOW (ref 8.9–10.3)
Chloride: 84 mmol/L — ABNORMAL LOW (ref 98–111)
Creatinine, Ser: 0.64 mg/dL (ref 0.44–1.00)
GFR, Estimated: >60 mL/min (ref >60)
Glucose, Bld: 132 mg/dL — ABNORMAL HIGH (ref 70–99)
Potassium: 3.8 mmol/L (ref 3.5–5.1)
Sodium: 137 mmol/L (ref 135–145)

## 2023-11-16 LAB — CBC
HCT: 44.8 % (ref 36.0–46.0)
Hemoglobin: 13.5 g/dL (ref 12.0–15.0)
MCH: 28.2 pg (ref 26.0–34.0)
MCHC: 30.1 g/dL (ref 30.0–36.0)
MCV: 93.7 fL (ref 80.0–100.0)
Platelets: 208 10*3/uL (ref 150–400)
RBC: 4.78 MIL/uL (ref 3.87–5.11)
RDW: 13.5 % (ref 11.5–15.5)
WBC: 7.1 10*3/uL (ref 4.0–10.5)
nRBC: 0 % (ref 0.0–0.2)

## 2023-11-16 NOTE — Plan of Care (Signed)
   Problem: Education: Goal: Knowledge of General Education information will improve Description: Including pain rating scale, medication(s)/side effects and non-pharmacologic comfort measures Outcome: Progressing   Problem: Health Behavior/Discharge Planning: Goal: Ability to manage health-related needs will improve Outcome: Progressing   Problem: Clinical Measurements: Goal: Will remain free from infection Outcome: Progressing

## 2023-11-16 NOTE — Progress Notes (Signed)
 OT Cancellation Note  Patient Details Name: Lauramae Mckinsey MRN: [id number] DOB: 05/23/1961   Cancelled Treatment:    Reason Eval/Treat Not Completed: Fatigue/lethargy limiting ability to participate Patient sleeping in bed with even breathing pattern observed. OT to continue to follow and check back as schedule will allow.  Wynette Heckler, MS Acute Rehabilitation Department Office# [phone] 11/16/2023, 1:10 PM

## 2023-11-16 NOTE — Evaluation (Signed)
 Occupational Therapy Evaluation Patient Details Name: Yvette Booth MRN: [id number] DOB: [birth year] Today's Date: 11/16/2023   History of Present Illness   Yvette Booth is a 63 y.o. female presented 11/14/23 secondary to shortness of breath. patient was admitted with evidence of a COPD exacerbation in addition to respiratory failure requiring BiPAP. PMH: hypertension, COPD, chronic respiratory failure with hypoxia and hypercapnia on 3 L/min, RV failure, atrial fibrillation, DVT, hyperlipidemia.     Clinical Impressions Patient evaluated by Occupational Therapy with no further acute OT needs identified. All education has been completed and the patient has no further questions. Patient is MI for ADLs at this time on 5L/min. Mobility specialist referral was placed.  See below for any follow-up Occupational Therapy or equipment needs. OT is signing off. Thank you for this referral.      If plan is discharge home, recommend the following:   Supervision due to cognitive status     Functional Status Assessment   Patient has not had a recent decline in their functional status     Equipment Recommendations   None recommended by OT      Precautions/Restrictions   Precautions Precaution/Restrictions Comments: monitor sats, was on 2-3 at home, now 5 Restrictions Weight Bearing Restrictions Per Provider Order: No     Mobility Bed Mobility Overal bed mobility: Independent                  Transfers Overall transfer level: Modified independent                        Balance Overall balance assessment: No apparent balance deficits (not formally assessed)               ADL either performed or assessed with clinical judgement   ADL Overall ADL's : Modified independent           General ADL Comments: patient is MI in room on 5L/min for toileting, transfers, and LB Dressing tasks. patient declined to engage in grooming tasks reporting she needed brush not  comb that was provided day prior. patient was able to engage in functional mobility with some listing to R with walker noted to have lean on it. patient endorsed being at her baseline and plan to go home with family support. no OT needs identified.     Vision Patient Visual Report: No change from baseline              Pertinent Vitals/Pain Pain Assessment Pain Assessment: No/denies pain     Extremity/Trunk Assessment Upper Extremity Assessment Upper Extremity Assessment: Overall WFL for tasks assessed   Lower Extremity Assessment Lower Extremity Assessment: Overall WFL for tasks assessed   Cervical / Trunk Assessment Cervical / Trunk Assessment: Normal   Communication Communication Communication: No apparent difficulties   Cognition Arousal: Alert Behavior During Therapy: WFL for tasks assessed/performed Cognition: No apparent impairments             OT - Cognition Comments: patient had some confusion but reported that family is home with her all day. patient reported she is at her baseline.                 Following commands: Intact                  Home Living Family/patient expects to be discharged to:: Private residence Living Arrangements: Children (and grandchildren) Available Help at Discharge: Family;Available PRN/intermittently Type of Home: House Home Access: (P) Level  entry     Home Layout: One level     Bathroom Shower/Tub: Chief Strategy Officer: (P) Standard     Home Equipment: Agricultural consultant (2 wheels);BSC/3in1          Prior Functioning/Environment Prior Level of Function : Independent/Modified Independent;Driving               ADLs Comments: patient reported getting pill packs from pharmacy. family supports with IADLs as needed.    OT Problem List:     OT Treatment/Interventions:        OT Goals(Current goals can be found in the care plan section)   Acute Rehab OT Goals OT Goal Formulation: All  assessment and education complete, DC therapy   OT Frequency:       Co-evaluation PT/OT/SLP Co-Evaluation/Treatment: Yes Reason for Co-Treatment: To address functional/ADL transfers PT goals addressed during session: Mobility/safety with mobility OT goals addressed during session: ADL's and self-care      AM-PAC OT "6 Clicks" Daily Activity     Outcome Measure Help from another person eating meals?: None Help from another person taking care of personal grooming?: None Help from another person toileting, which includes using toliet, bedpan, or urinal?: None Help from another person bathing (including washing, rinsing, drying)?: None Help from another person to put on and taking off regular upper body clothing?: None Help from another person to put on and taking off regular lower body clothing?: None 6 Click Score: 24   End of Session Equipment Utilized During Treatment: Gait belt;Rolling walker (2 wheels);Oxygen  Activity Tolerance: Patient tolerated treatment well Patient left: in bed;with call bell/phone within reach  OT Visit Diagnosis: Unsteadiness on feet (R26.81)                Time: 6213-0865 OT Time Calculation (min): 14 min Charges:  OT General Charges $OT Visit: 1 Visit OT Evaluation $OT Eval Low Complexity: 1 Low  Jaxton Casale OTR/L, MS Acute Rehabilitation Department Office# [phone]   Jame Maze 11/16/2023, 4:13 PM

## 2023-11-16 NOTE — Progress Notes (Signed)
 PROGRESS NOTE    Yvette Booth  NWG:[id number] DOB: [birth year] DOA: 11/14/2023 PCP: [facility]   Brief Narrative: Yvette Booth is a 62 y.o. female with a history of hypertension, COPD, chronic respiratory failure with hypoxia and hypercapnia on 3 L/min of oxygen and NIV at night, RV failure, atrial fibrillation, DVT, hyperlipidemia.  Patient presented secondary to shortness of breath with evidence of a COPD exacerbation in addition to respiratory failure requiring BiPAP. Patient started on breathing treatments, steroids and doxycycline. BiPAP started for management of respiratory failure with associated chronic hypercapnia.   Assessment and Plan:  COPD exacerbation Present on admission. Unclear trigger. Patient started on steroids, bronchodilators and doxycycline. -Continue Solu-medrol , bronchodilators and doxycycline -Incentive spirometer  Acute on chronic respiratory failure with hypoxia Chronic respiratory failure with hypercapnia Patient follows with pulmonology as an outpatient. Patient is on chronic oxygen and uses NIV at home, but admits she uses it inconsistently. -Continue oxygen at 3 L/min; wean to 3 L/min as able -Continue BiPAP at bedtime -Ambulatory pulse ox prior to discharge  Paroxysmal atrial fibrillation Rate control, in sinus rhythm. Patient is on Eliquis  as an outpateint. -Continue Eliquis   History of CVA -Continue Eliquis   Hyperthyroidism -Continue Tapazole   OSA Patient uses non-invasive ventilation overnight. -Continue BiPAP qHS  Tobacco abuse Counseled on admission. -Continue nicotine  patch  Primary hypertension Patient is on metoprolol  as an outpatient, which was held on admission. Patient started on bisoprolol. -Continue home metoprolol   History of DVT -Continue Eliquis    DVT prophylaxis: Eliquis  Code Status:   Code Status: Full Code Family Communication: None at bedside Disposition Plan: Discharge home likely  in 1 day pending stable respiratory status, wean to baseline oxygen use, PT/OT eval   Consultants:  None  Procedures:  None  Antimicrobials: Doxycycline    Subjective: Feeling better than on admission. Still with cough. Difficult to produce sputum.  Objective: BP (!) 124/54   Pulse 79   Temp 97.9 F (36.6 C) (Oral)   Resp 18   Ht 5\' 3"  (1.6 m)   Wt 75.2 kg   SpO2 (!) 84%   BMI 29.37 kg/m   Examination:  General exam: Appears calm and comfortable Respiratory system: Diffuse wheezing. Respiratory effort normal. Cardiovascular system: S1 & S2 heard, RRR. No murmurs, rubs, gallops or clicks. Gastrointestinal system: Abdomen is nondistended, soft and nontender. Normal bowel sounds heard. Central nervous system: Alert and oriented. No focal neurological deficits. Musculoskeletal: No edema. No calf tenderness Psychiatry: Judgement and insight appear normal. Mood & affect appropriate.    Data Reviewed: I have personally reviewed following labs and imaging studies  CBC Lab Results  Component Value Date   WBC 7.1 11/16/2023   RBC 4.78 11/16/2023   HGB 13.5 11/16/2023   HCT 44.8 11/16/2023   MCV 93.7 11/16/2023   MCH 28.2 11/16/2023   PLT 208 11/16/2023   MCHC 30.1 11/16/2023   RDW 13.5 11/16/2023   LYMPHSABS 1.2 11/14/2023   MONOABS 0.4 11/14/2023   EOSABS 0.1 11/14/2023   BASOSABS 0.0 11/14/2023     Last metabolic panel Lab Results  Component Value Date   NA 137 11/16/2023   K 3.8 11/16/2023   CL 84 (L) 11/16/2023   CO2 38 (H) 11/16/2023   BUN 25 (H) 11/16/2023   CREATININE 0.64 11/16/2023   GLUCOSE 132 (H) 11/16/2023   GFRNONAA >60 11/16/2023   GFRAA >60 12/29/2018   CALCIUM  8.7 (L) 11/16/2023   PHOS 4.1 11/15/2023   PROT 6.6  11/15/2023   ALBUMIN 3.7 11/15/2023   BILITOT 0.5 11/15/2023   ALKPHOS 57 11/15/2023   AST 13 (L) 11/15/2023   ALT 9 11/15/2023   ANIONGAP 15 11/16/2023    GFR: Estimated Creatinine Clearance: 69.9 mL/min (by C-G  formula based on SCr of 0.64 mg/dL).  Recent Results (from the past 240 hours)  Respiratory (~20 pathogens) panel by PCR     Status: None   Collection Time: 11/14/23 11:38 PM   Specimen: Nasopharyngeal Swab; Respiratory  Result Value Ref Range Status   Adenovirus NOT DETECTED NOT DETECTED Final   Coronavirus 229E NOT DETECTED NOT DETECTED Final    Comment: (NOTE) The Coronavirus on the Respiratory Panel, DOES NOT test for the novel  Coronavirus (2019 nCoV)    Coronavirus HKU1 NOT DETECTED NOT DETECTED Final   Coronavirus NL63 NOT DETECTED NOT DETECTED Final   Coronavirus OC43 NOT DETECTED NOT DETECTED Final   Metapneumovirus NOT DETECTED NOT DETECTED Final   Rhinovirus / Enterovirus NOT DETECTED NOT DETECTED Final   Influenza A NOT DETECTED NOT DETECTED Final   Influenza B NOT DETECTED NOT DETECTED Final   Parainfluenza Virus 1 NOT DETECTED NOT DETECTED Final   Parainfluenza Virus 2 NOT DETECTED NOT DETECTED Final   Parainfluenza Virus 3 NOT DETECTED NOT DETECTED Final   Parainfluenza Virus 4 NOT DETECTED NOT DETECTED Final   Respiratory Syncytial Virus NOT DETECTED NOT DETECTED Final   Bordetella pertussis NOT DETECTED NOT DETECTED Final   Bordetella Parapertussis NOT DETECTED NOT DETECTED Final   Chlamydophila pneumoniae NOT DETECTED NOT DETECTED Final   Mycoplasma pneumoniae NOT DETECTED NOT DETECTED Final    Comment: Performed at [facility], [address], [place], [place] [address]  Resp panel by RT-PCR (RSV, Flu A&B, Covid) Anterior Nasal Swab     Status: None   Collection Time: 11/14/23 11:38 PM   Specimen: Anterior Nasal Swab  Result Value Ref Range Status   SARS Coronavirus 2 by RT PCR NEGATIVE NEGATIVE Final    Comment: (NOTE) SARS-CoV-2 target nucleic acids are NOT DETECTED.  The SARS-CoV-2 RNA is generally detectable in upper respiratory specimens during the acute phase of infection. The lowest concentration of SARS-CoV-2 viral copies this assay can detect  is 138 copies/mL. A negative result does not preclude SARS-Cov-2 infection and should not be used as the sole basis for treatment or other patient management decisions. A negative result may occur with  improper specimen collection/handling, submission of specimen other than nasopharyngeal swab, presence of viral mutation(s) within the areas targeted by this assay, and inadequate number of viral copies(<138 copies/mL). A negative result must be combined with clinical observations, patient history, and epidemiological information. The expected result is Negative.  Fact Sheet for Patients:  BloggerCourse.com  Fact Sheet for Healthcare Providers:  SeriousBroker.it  This test is no t yet approved or cleared by the [place]  FDA and  has been authorized for detection and/or diagnosis of SARS-CoV-2 by FDA under an Emergency Use Authorization (EUA). This EUA will remain  in effect (meaning this test can be used) for the duration of the COVID-19 declaration under Section 564(b)(1) of the Act, 21 U.S.C.section 360bbb-3(b)(1), unless the authorization is terminated  or revoked sooner.       Influenza A by PCR NEGATIVE NEGATIVE Final   Influenza B by PCR NEGATIVE NEGATIVE Final    Comment: (NOTE) The Xpert Xpress SARS-CoV-2/FLU/RSV plus assay is intended as an aid in the diagnosis of influenza from Nasopharyngeal swab specimens  and should not be used as a sole basis for treatment. Nasal washings and aspirates are unacceptable for Xpert Xpress SARS-CoV-2/FLU/RSV testing.  Fact Sheet for Patients: BloggerCourse.com  Fact Sheet for Healthcare Providers: SeriousBroker.it  This test is not yet approved or cleared by the [place]  FDA and has been authorized for detection and/or diagnosis of SARS-CoV-2 by FDA under an Emergency Use Authorization (EUA). This EUA will remain in effect  (meaning this test can be used) for the duration of the COVID-19 declaration under Section 564(b)(1) of the Act, 21 U.S.C. section 360bbb-3(b)(1), unless the authorization is terminated or revoked.     Resp Syncytial Virus by PCR NEGATIVE NEGATIVE Final    Comment: (NOTE) Fact Sheet for Patients: BloggerCourse.com  Fact Sheet for Healthcare Providers: SeriousBroker.it  This test is not yet approved or cleared by the [place]  FDA and has been authorized for detection and/or diagnosis of SARS-CoV-2 by FDA under an Emergency Use Authorization (EUA). This EUA will remain in effect (meaning this test can be used) for the duration of the COVID-19 declaration under Section 564(b)(1) of the Act, 21 U.S.C. section 360bbb-3(b)(1), unless the authorization is terminated or revoked.  Performed at [facility], [address] [place], [place] [address]   MRSA Next Gen by PCR, Nasal     Status: Abnormal   Collection Time: 11/15/23 12:45 AM   Specimen: Nasal Mucosa; Nasal Swab  Result Value Ref Range Status   MRSA by PCR Next Gen DETECTED (A) NOT DETECTED Final    Comment: (NOTE) The GeneXpert MRSA Assay (FDA approved for NASAL specimens only), is one component of a comprehensive MRSA colonization surveillance program. It is not intended to diagnose MRSA infection nor to guide or monitor treatment for MRSA infections. Test performance is not FDA approved in patients less than 18 years old. Performed at [facility], [address], [place], [place] [address]   Expectorated Sputum Assessment w Gram Stain, Rflx to Resp Cult     Status: None   Collection Time: 11/15/23  2:00 PM   Specimen: Expectorated Sputum  Result Value Ref Range Status   Specimen Description EXPECTORATED SPUTUM  Final   Special Requests NONE  Final   Sputum evaluation   Final    THIS SPECIMEN IS ACCEPTABLE FOR SPUTUM  CULTURE Performed at [facility], [address] [place], [place] [address]    Report Status 11/15/2023 FINAL  Final  Culture, Respiratory w Gram Stain     Status: None (Preliminary result)   Collection Time: 11/15/23  2:00 PM  Result Value Ref Range Status   Specimen Description   Final    EXPECTORATED SPUTUM Performed at [facility], [address], [place], [place] [address]    Special Requests   Final    NONE Reflexed from [id number] Performed at [facility], [address], [place], [place] [address]    Gram Stain   Final    FEW WBC PRESENT, PREDOMINANTLY MONONUCLEAR RARE GRAM POSITIVE COCCI IN SINGLES RARE GRAM VARIABLE ROD Performed at [facility], [address] [place], [place] [address]    Culture PENDING  Incomplete   Report Status PENDING  Incomplete      Radiology Studies: DG Chest Port 1 View Result Date: 11/14/2023 CLINICAL DATA:  Initial evaluation for acute respiratory distress. EXAM: PORTABLE CHEST 1 VIEW COMPARISON:  Prior radiograph from 10/15/2023 FINDINGS: Transverse heart size stable, and remains within normal limits.  Mediastinal silhouette within normal limits. Aortic atherosclerosis. Lungs are normally inflated. Attenuation of the pulmonary markings superiorly, consistent with emphysema. Chronic coarsening of the interstitial markings at the lung bases, stable. No other focal airspace disease. No pulmonary edema or pleural effusion. No pneumothorax. Prominence of the main pulmonary arteries noted bilaterally. Visualized soft tissues and osseous structures demonstrate no acute finding. IMPRESSION: 1. No radiographic evidence for active cardiopulmonary disease. 2.  Emphysema (ICD10-J43.9). 3. Prominence of the main pulmonary arteries bilaterally, which can be seen with pulmonary arterial hypertension. Electronically Signed   By: Virgia Griffins M.D.   On: 11/14/2023 22:34      LOS: 2 days     Aneita Keens, MD [facility] 11/16/2023, 9:32 AM   If 7PM-7AM, please contact night-coverage [email or web address]

## 2023-11-16 NOTE — Evaluation (Signed)
 Physical Therapy Evaluation Patient Details Name: Yvette Booth MRN: [id number] DOB: 1961/04/14 Today's Date: 11/16/2023  History of Present Illness  Yvette Booth is a 63 y.o. female presented 11/14/23 secondary to shortness of breath. patient was admitted with evidence of a COPD exacerbation in addition to respiratory failure requiring BiPAP. PMH: hypertension, COPD, chronic respiratory failure with hypoxia and hypercapnia on 3 L/min, RV failure, atrial fibrillation, DVT, hyperlipidemia.  Clinical Impression  Pt admitted with above diagnosis.  Pt currently with functional limitations due to the deficits listed below (see PT Problem List). Pt will benefit from acute skilled PT to increase their independence and safety with mobility to allow discharge.     The patient  is mobilizing  to [facility] independently. Ambulated x 180' using RW. Patient  should progress to return home. Patient did use 5 LPM  with SPo2  ~ 89-91 % range. Progress ambulation, monitor SPO2 attempt to wean down.      If plan is discharge home, recommend the following: A little help with bathing/dressing/bathroom;Assistance with cooking/housework;Assist for transportation;Help with stairs or ramp for entrance   Can travel by private vehicle        Equipment Recommendations None recommended by PT  Recommendations for Other Services       Functional Status Assessment Patient has had a recent decline in their functional status and demonstrates the ability to make significant improvements in function in a reasonable and predictable amount of time.     Precautions / Restrictions Precautions Precaution/Restrictions Comments: monito sats, was on 2-3 at home, now 5 Restrictions Weight Bearing Restrictions Per Provider Order: No      Mobility  Bed Mobility Overal bed mobility: Independent                  Transfers Overall transfer level: Modified independent                       Ambulation/Gait Ambulation/Gait assistance: Supervision Gait Distance (Feet): 180 Feet Assistive device: Rolling walker (2 wheels) Gait Pattern/deviations: Step-through pattern Gait velocity: decr     General Gait Details: veers to right ? RW causing, no loss of balance  Stairs            Wheelchair Mobility     Tilt Bed    Modified Rankin (Stroke Patients Only)       Balance Overall balance assessment: No apparent balance deficits (not formally assessed)                                           Pertinent Vitals/Pain Pain Assessment Pain Assessment: No/denies pain    Home Living Family/patient expects to be discharged to:: Private residence Living Arrangements: Children (and grandchildren) Available Help at Discharge: Family;Available PRN/intermittently Type of Home: House Home Access: (P) Level entry       Home Layout: One level Home Equipment: Agricultural consultant (2 wheels);BSC/3in1      Prior Function Prior Level of Function : Independent/Modified Independent;Driving               ADLs Comments: patient reported getting pill packs from pharmacy. family supports with IADLs as needed.     Extremity/Trunk Assessment        Lower Extremity Assessment Lower Extremity Assessment: Overall WFL for tasks assessed    Cervical / Trunk Assessment Cervical / Trunk Assessment: Normal  Communication  Communication Communication: No apparent difficulties    Cognition Arousal: Alert Behavior During Therapy: WFL for tasks assessed/performed   PT - Cognitive impairments: No apparent impairments                         Following commands: Intact       Cueing       General Comments      Exercises     Assessment/Plan    PT Assessment Patient needs continued PT services  PT Problem List Cardiopulmonary status limiting activity       PT Treatment Interventions DME instruction;Therapeutic activities;Gait  training;Functional mobility training;Patient/family education    PT Goals (Current goals can be found in the Care Plan section)  Acute Rehab PT Goals Patient Stated Goal: go home PT Goal Formulation: With patient Time For Goal Achievement: 11/30/23 Potential to Achieve Goals: Good    Frequency Min 2X/week     Co-evaluation PT/OT/SLP Co-Evaluation/Treatment: Yes Reason for Co-Treatment: To address functional/ADL transfers PT goals addressed during session: Mobility/safety with mobility OT goals addressed during session: ADL's and self-care       AM-PAC PT "6 Clicks" Mobility  Outcome Measure                  End of Session   Activity Tolerance: Patient tolerated treatment well Patient left: in bed;with call bell/phone within reach Nurse Communication: Mobility status PT Visit Diagnosis: Difficulty in walking, not elsewhere classified (R26.2)    Time: 9562-1308 PT Time Calculation (min) (ACUTE ONLY): 14 min   Charges:   PT Evaluation $PT Eval Low Complexity: 1 Low   PT General Charges $$ ACUTE PT VISIT: 1 Visit         Abelina Hoes PT Acute Rehabilitation Services Office [phone] Weekend pager-[phone]   Dareen Ebbing 11/16/2023, 4:03 PM

## 2023-11-17 MED ORDER — PREDNISONE 20 MG PO TABS: 40.0000 mg | ORAL_TABLET | Freq: Every day | ORAL | 0 refills | Status: AC

## 2023-11-17 NOTE — Progress Notes (Signed)
 Mobility Specialist - Progress Note   11/17/23 1404  Oxygen Therapy  O2 Device Nasal Cannula  O2 Flow Rate (L/min) 3 L/min  Mobility  Activity Ambulated independently in hallway  Level of Assistance Independent  Assistive Device None  Distance Ambulated (ft) 240 ft  Activity Response Tolerated well  Mobility Referral Yes  Mobility visit 1 Mobility  Mobility Specialist Start Time (ACUTE ONLY) 1054  Mobility Specialist Stop Time (ACUTE ONLY) 1102  Mobility Specialist Time Calculation (min) (ACUTE ONLY) 8 min   Pt received in bed and agreeable to mobility. No complaints during session. Pt to bed after session with all needs met.    [facility]

## 2023-11-17 NOTE — Discharge Summary (Signed)
 Yvette Booth JOA:[id number] DOB: 12/26/1960 DOA: 11/14/2023  PCP: [facility]  Admit date: 11/14/2023  Discharge date: 11/19/2023  Admitted From: Home   disposition: Home   Recommendations for Outpatient Follow-up:   Follow up with PCP in 1-2 days   Home Health: N/A Equipment/Devices: N/A Consultations: N/A Discharge Condition: Improved CODE STATUS: Full    Chief Complaint  Patient presents with   Shortness of Breath     Brief history of present illness from the day of admission and additional interim summary     63 y.o. female with medical history significant of HTN, COPD on 3 L, tobacco abuse, sleep apnea on BiPAP, RV failure, A-fib/DVT on Eliquis , HTN HLD pulmonary nodule    Presented with   worsened shortness of breath Patient comes in from home complaining shortness of breath for past 3 days at baseline on 3 L FiO2 Given DuoNeb and route Known history of COPD VBG showing significant hypercarbia Recent admission for COPD exacerbation from April 11 to 14 Patient is on Eliquis  for A-fib And DVT   Similar presentation in mid April treated with IV steroids and Levaquin  for COPD exacerbation with improvement Has required BiPAP   Patient states she continues to smoke.  She is not interested in quitting very seriously although knows that it is important for her to do so   Has not been compliant with her BiPAP                                                                     Hospital Course   Patient improved with treatment of COPD with steroids and inhaled bronchodilators.  Patient is discharged home with 2 more days of prednisone  to follow-up with her PCP in 2 days to make sure she is still doing well.  COPD exacerbation Present on admission. Unclear trigger. Patient  started on steroids, bronchodilators and doxycycline. -Continue prednisone  for another 2 days, bronchodilators and doxycycline -Incentive spirometer   Acute on chronic respiratory failure with hypoxia Chronic respiratory failure with hypercapnia Patient follows with pulmonology as an outpatient. Patient is on chronic oxygen and uses NIV at home, but admits she uses it inconsistently. -Continue oxygen at 3 L/min; wean to 3 L/min as able -Continue BiPAP at bedtime   Paroxysmal atrial fibrillation Rate control, in sinus rhythm. Patient is on Eliquis  as an outpateint. -Continue Eliquis    History of CVA -Continue Eliquis    Hyperthyroidism -Continue Tapazole    OSA Patient uses non-invasive ventilation overnight. -Continue BiPAP qHS   Tobacco abuse Counseled on admission. -Continue nicotine  patch   Primary hypertension Patient is on metoprolol  as an outpatient, which was held on admission. Patient started on bisoprolol. -Continue home metoprolol    History of DVT -Continue Eliquis     Discharge diagnosis  Principal Problem:   COPD exacerbation (HCC) Active Problems:   Acute on chronic respiratory failure with hypoxia and hypercapnia (HCC)   PAF (paroxysmal atrial fibrillation) (HCC)   Hyperthyroidism   OSA (obstructive sleep apnea)   History of CVA (cerebrovascular accident)   Hypercapnia   Tobacco abuse    Discharge instructions    Discharge Instructions     Discharge instructions   Complete by: As directed    Please follow-up with your doctor in the next 1 to 2 days to make sure you are still doing well even after finishing your prednisone .  Make sure you are taking all of your inhalers as directed.  Do not miss any of them.   Increase activity slowly   Complete by: As directed        Discharge Medications   Allergies as of 11/17/2023       Reactions   Seroquel [quetiapine] Other (See Comments)   Delirium and confusion         Medication List      STOP taking these medications    benzonatate  100 MG capsule Commonly known as: TESSALON        TAKE these medications    albuterol  108 (90 Base) MCG/ACT inhaler Commonly known as: VENTOLIN  HFA Inhale 2 puffs into the lungs every 6 (six) hours as needed for wheezing or shortness of breath.   Eliquis  5 MG Tabs tablet Generic drug: apixaban  Take 5 mg by mouth 2 (two) times daily.   fluticasone -salmeterol 250-50 MCG/ACT Aepb Commonly known as: ADVAIR Inhale 1 puff into the lungs in the morning and at bedtime.   furosemide  40 MG tablet Commonly known as: LASIX  Take 40 mg by mouth daily.   Incruse Ellipta  62.5 MCG/ACT Aepb Generic drug: umeclidinium bromide  Inhale 1 puff into the lungs daily.   methimazole  10 MG tablet Commonly known as: TAPAZOLE  Take 20 mg by mouth daily.   metoprolol  succinate 50 MG 24 hr tablet Commonly known as: TOPROL -XL Take 50 mg by mouth daily. Take with or immediately following a meal.   OXYGEN Inhale 2 L/min into the lungs continuous.   predniSONE  20 MG tablet Commonly known as: DELTASONE  Take 2 tablets (40 mg total) by mouth daily with breakfast for 2 doses.          Major procedures and Radiology Reports - PLEASE review detailed and final reports thoroughly  -        DG Chest Port 1 View Result Date: 11/14/2023 CLINICAL DATA:  Initial evaluation for acute respiratory distress. EXAM: PORTABLE CHEST 1 VIEW COMPARISON:  Prior radiograph from 10/15/2023 FINDINGS: Transverse heart size stable, and remains within normal limits. Mediastinal silhouette within normal limits. Aortic atherosclerosis. Lungs are normally inflated. Attenuation of the pulmonary markings superiorly, consistent with emphysema. Chronic coarsening of the interstitial markings at the lung bases, stable. No other focal airspace disease. No pulmonary edema or pleural effusion. No pneumothorax. Prominence of the main pulmonary arteries noted bilaterally. Visualized soft  tissues and osseous structures demonstrate no acute finding. IMPRESSION: 1. No radiographic evidence for active cardiopulmonary disease. 2.  Emphysema (ICD10-J43.9). 3. Prominence of the main pulmonary arteries bilaterally, which can be seen with pulmonary arterial hypertension. Electronically Signed   By: Virgia Griffins M.D.   On: 11/14/2023 22:34    Micro Results    Recent Results (from the past 240 hours)  Respiratory (~20 pathogens) panel by PCR     Status: None   Collection Time: 11/14/23 11:38 PM   Specimen:  Nasopharyngeal Swab; Respiratory  Result Value Ref Range Status   Adenovirus NOT DETECTED NOT DETECTED Final   Coronavirus 229E NOT DETECTED NOT DETECTED Final    Comment: (NOTE) The Coronavirus on the Respiratory Panel, DOES NOT test for the novel  Coronavirus (2019 nCoV)    Coronavirus HKU1 NOT DETECTED NOT DETECTED Final   Coronavirus NL63 NOT DETECTED NOT DETECTED Final   Coronavirus OC43 NOT DETECTED NOT DETECTED Final   Metapneumovirus NOT DETECTED NOT DETECTED Final   Rhinovirus / Enterovirus NOT DETECTED NOT DETECTED Final   Influenza A NOT DETECTED NOT DETECTED Final   Influenza B NOT DETECTED NOT DETECTED Final   Parainfluenza Virus 1 NOT DETECTED NOT DETECTED Final   Parainfluenza Virus 2 NOT DETECTED NOT DETECTED Final   Parainfluenza Virus 3 NOT DETECTED NOT DETECTED Final   Parainfluenza Virus 4 NOT DETECTED NOT DETECTED Final   Respiratory Syncytial Virus NOT DETECTED NOT DETECTED Final   Bordetella pertussis NOT DETECTED NOT DETECTED Final   Bordetella Parapertussis NOT DETECTED NOT DETECTED Final   Chlamydophila pneumoniae NOT DETECTED NOT DETECTED Final   Mycoplasma pneumoniae NOT DETECTED NOT DETECTED Final    Comment: Performed at [facility], [address], [place], [place] [address]  Resp panel by RT-PCR (RSV, Flu A&B, Covid) Anterior Nasal Swab     Status: None   Collection Time: 11/14/23 11:38 PM   Specimen: Anterior Nasal Swab   Result Value Ref Range Status   SARS Coronavirus 2 by RT PCR NEGATIVE NEGATIVE Final    Comment: (NOTE) SARS-CoV-2 target nucleic acids are NOT DETECTED.  The SARS-CoV-2 RNA is generally detectable in upper respiratory specimens during the acute phase of infection. The lowest concentration of SARS-CoV-2 viral copies this assay can detect is 138 copies/mL. A negative result does not preclude SARS-Cov-2 infection and should not be used as the sole basis for treatment or other patient management decisions. A negative result may occur with  improper specimen collection/handling, submission of specimen other than nasopharyngeal swab, presence of viral mutation(s) within the areas targeted by this assay, and inadequate number of viral copies(<138 copies/mL). A negative result must be combined with clinical observations, patient history, and epidemiological information. The expected result is Negative.  Fact Sheet for Patients:  BloggerCourse.com  Fact Sheet for Healthcare Providers:  SeriousBroker.it  This test is no t yet approved or cleared by the [place]  FDA and  has been authorized for detection and/or diagnosis of SARS-CoV-2 by FDA under an Emergency Use Authorization (EUA). This EUA will remain  in effect (meaning this test can be used) for the duration of the COVID-19 declaration under Section 564(b)(1) of the Act, 21 U.S.C.section 360bbb-3(b)(1), unless the authorization is terminated  or revoked sooner.       Influenza A by PCR NEGATIVE NEGATIVE Final   Influenza B by PCR NEGATIVE NEGATIVE Final    Comment: (NOTE) The Xpert Xpress SARS-CoV-2/FLU/RSV plus assay is intended as an aid in the diagnosis of influenza from Nasopharyngeal swab specimens and should not be used as a sole basis for treatment. Nasal washings and aspirates are unacceptable for Xpert Xpress SARS-CoV-2/FLU/RSV testing.  Fact Sheet for  Patients: BloggerCourse.com  Fact Sheet for Healthcare Providers: SeriousBroker.it  This test is not yet approved or cleared by the [place]  FDA and has been authorized for detection and/or diagnosis of SARS-CoV-2 by FDA under an Emergency Use Authorization (EUA). This EUA will remain in effect (meaning this test can be used) for the  duration of the COVID-19 declaration under Section 564(b)(1) of the Act, 21 U.S.C. section 360bbb-3(b)(1), unless the authorization is terminated or revoked.     Resp Syncytial Virus by PCR NEGATIVE NEGATIVE Final    Comment: (NOTE) Fact Sheet for Patients: BloggerCourse.com  Fact Sheet for Healthcare Providers: SeriousBroker.it  This test is not yet approved or cleared by the [place]  FDA and has been authorized for detection and/or diagnosis of SARS-CoV-2 by FDA under an Emergency Use Authorization (EUA). This EUA will remain in effect (meaning this test can be used) for the duration of the COVID-19 declaration under Section 564(b)(1) of the Act, 21 U.S.C. section 360bbb-3(b)(1), unless the authorization is terminated or revoked.  Performed at [facility], [address], [place], [place] [address]   MRSA Next Gen by PCR, Nasal     Status: Abnormal   Collection Time: 11/15/23 12:45 AM   Specimen: Nasal Mucosa; Nasal Swab  Result Value Ref Range Status   MRSA by PCR Next Gen DETECTED (A) NOT DETECTED Final    Comment: (NOTE) The GeneXpert MRSA Assay (FDA approved for NASAL specimens only), is one component of a comprehensive MRSA colonization surveillance program. It is not intended to diagnose MRSA infection nor to guide or monitor treatment for MRSA infections. Test performance is not FDA approved in patients less than 74 years old. Performed at [facility], [address], [place],  [place] [address]   Expectorated Sputum Assessment w Gram Stain, Rflx to Resp Cult     Status: None   Collection Time: 11/15/23  2:00 PM   Specimen: Expectorated Sputum  Result Value Ref Range Status   Specimen Description EXPECTORATED SPUTUM  Final   Special Requests NONE  Final   Sputum evaluation   Final    THIS SPECIMEN IS ACCEPTABLE FOR SPUTUM CULTURE Performed at [facility], [address] [place], [place] [address]    Report Status 11/15/2023 FINAL  Final  Culture, Respiratory w Gram Stain     Status: None   Collection Time: 11/15/23  2:00 PM  Result Value Ref Range Status   Specimen Description   Final    EXPECTORATED SPUTUM Performed at [facility], [address] [place], [place] [address]    Special Requests   Final    NONE Reflexed from [id number] Performed at [facility], [address], [place], [place] [address]    Gram Stain   Final    FEW WBC PRESENT, PREDOMINANTLY MONONUCLEAR RARE GRAM POSITIVE COCCI IN SINGLES RARE GRAM VARIABLE ROD    Culture   Final    RARE Normal respiratory flora-no Staph aureus or Pseudomonas seen Performed at [facility], [address] [place], [place] [address]    Report Status 11/18/2023 FINAL  Final    Today   Subjective    Yvette Booth feels much improved since admission.  Feels ready to go home.  Denies chest pain, shortness of breath or abdominal pain.  Feels they can take care of themselves with the resources they have at home.  Objective   Blood pressure (!) 124/111, pulse 70, temperature 98.1 F (36.7 C), resp. rate 18, height 5\' 3"  (1.6 m), weight 75.2 kg, SpO2 97%.  No intake or output data in the 24 hours ending 11/19/23 1703   Exam General: Patient appears well and in good spirits sitting up in bed in no acute distress.  Eyes: sclera anicteric, conjuctiva mild injection  bilaterally CVS: S1-S2, regular  Respiratory:  decreased air entry bilaterally  secondary to decreased inspiratory effort, rales at bases  GI: NABS, soft, NT  LE: No edema.  Neuro: A/O x 3, Moving all extremities equally with normal strength, CN 3-12 intact, grossly nonfocal.  Psych: patient is logical and coherent, judgement and insight appear normal, mood and affect appropriate to situation.    Data Review   CBC w Diff:  Lab Results  Component Value Date   WBC 7.1 11/16/2023   HGB 13.5 11/16/2023   HCT 44.8 11/16/2023   PLT 208 11/16/2023   LYMPHOPCT 21 11/14/2023   MONOPCT 6 11/14/2023   EOSPCT 1 11/14/2023   BASOPCT 0 11/14/2023    CMP:  Lab Results  Component Value Date   NA 137 11/16/2023   K 3.8 11/16/2023   CL 84 (L) 11/16/2023   CO2 38 (H) 11/16/2023   BUN 25 (H) 11/16/2023   CREATININE 0.64 11/16/2023   PROT 6.6 11/15/2023   ALBUMIN 3.7 11/15/2023   BILITOT 0.5 11/15/2023   ALKPHOS 57 11/15/2023   AST 13 (L) 11/15/2023   ALT 9 11/15/2023  .   Total Time in preparing paper work, data evaluation and todays exam - 35 minutes  Everli Rother Tublu Jenny Lai M.D on 11/19/2023 at 5:03 PM  [facility]

## 2023-11-17 NOTE — Care Management Important Message (Signed)
 Important Message  Patient Details  Name: Yvette Booth MRN: [id number] Date of Birth: [birth year]   Important Message Given:        [name] 11/17/2023, 4:23 PM

## 2023-11-17 NOTE — Plan of Care (Signed)
  Problem: Education: Goal: Knowledge of General Education information will improve Description: Including pain rating scale, medication(s)/side effects and non-pharmacologic comfort measures 11/17/2023 1313 by Shayla Delude, RN Outcome: Adequate for Discharge 11/17/2023 1312 by Shayla Delude, RN Outcome: Adequate for Discharge   Problem: Health Behavior/Discharge Planning: Goal: Ability to manage health-related needs will improve Outcome: Adequate for Discharge   Problem: Clinical Measurements: Goal: Ability to maintain clinical measurements within normal limits will improve Outcome: Adequate for Discharge Goal: Will remain free from infection Outcome: Adequate for Discharge Goal: Diagnostic test results will improve Outcome: Adequate for Discharge Goal: Respiratory complications will improve Outcome: Adequate for Discharge Goal: Cardiovascular complication will be avoided Outcome: Adequate for Discharge   Problem: Activity: Goal: Risk for activity intolerance will decrease Outcome: Adequate for Discharge   Problem: Nutrition: Goal: Adequate nutrition will be maintained Outcome: Adequate for Discharge   Problem: Coping: Goal: Level of anxiety will decrease Outcome: Adequate for Discharge   Problem: Elimination: Goal: Will not experience complications related to bowel motility Outcome: Adequate for Discharge Goal: Will not experience complications related to urinary retention Outcome: Adequate for Discharge   Problem: Pain Managment: Goal: General experience of comfort will improve and/or be controlled Outcome: Adequate for Discharge   Problem: Safety: Goal: Ability to remain free from injury will improve Outcome: Adequate for Discharge   Problem: Skin Integrity: Goal: Risk for impaired skin integrity will decrease Outcome: Adequate for Discharge   Problem: Education: Goal: Knowledge of disease or condition will improve Outcome: Adequate for Discharge Goal:  Knowledge of the prescribed therapeutic regimen will improve Outcome: Adequate for Discharge Goal: Individualized Educational Video(s) Outcome: Adequate for Discharge   Problem: Activity: Goal: Ability to tolerate increased activity will improve Outcome: Adequate for Discharge Goal: Will verbalize the importance of balancing activity with adequate rest periods Outcome: Adequate for Discharge   Problem: Respiratory: Goal: Ability to maintain a clear airway will improve Outcome: Adequate for Discharge Goal: Levels of oxygenation will improve Outcome: Adequate for Discharge Goal: Ability to maintain adequate ventilation will improve Outcome: Adequate for Discharge

## 2023-11-18 LAB — CULTURE, RESPIRATORY W GRAM STAIN: Culture: NORMAL

## 2023-11-24 ENCOUNTER — Other Ambulatory Visit: Payer: Self-pay

## 2023-11-24 ENCOUNTER — Inpatient Hospital Stay (HOSPITAL_COMMUNITY)
Admission: EM | Admit: 2023-11-24 | Discharge: 2023-11-27 | DRG: 189 | Disposition: A | Discharge status: Home / Self Care | Attending: Internal Medicine | Admitting: Internal Medicine

## 2023-11-24 ENCOUNTER — Emergency Department (HOSPITAL_COMMUNITY)

## 2023-11-24 ENCOUNTER — Encounter (HOSPITAL_COMMUNITY): Payer: Self-pay

## 2023-11-24 DIAGNOSIS — E873 Alkalosis: Secondary | ICD-10-CM | POA: Diagnosis present

## 2023-11-24 DIAGNOSIS — J9622 Acute and chronic respiratory failure with hypercapnia: Secondary | ICD-10-CM | POA: Diagnosis present

## 2023-11-24 DIAGNOSIS — Z7951 Long term (current) use of inhaled steroids: Secondary | ICD-10-CM | POA: Diagnosis not present

## 2023-11-24 DIAGNOSIS — E0590 Thyrotoxicosis, unspecified without thyrotoxic crisis or storm: Secondary | ICD-10-CM | POA: Diagnosis present

## 2023-11-24 DIAGNOSIS — Z716 Tobacco abuse counseling: Secondary | ICD-10-CM | POA: Diagnosis not present

## 2023-11-24 DIAGNOSIS — Z8673 Personal history of transient ischemic attack (TIA), and cerebral infarction without residual deficits: Secondary | ICD-10-CM | POA: Diagnosis not present

## 2023-11-24 DIAGNOSIS — J9611 Chronic respiratory failure with hypoxia: Secondary | ICD-10-CM | POA: Diagnosis present

## 2023-11-24 DIAGNOSIS — Z86718 Personal history of other venous thrombosis and embolism: Secondary | ICD-10-CM

## 2023-11-24 DIAGNOSIS — Z888 Allergy status to other drugs, medicaments and biological substances status: Secondary | ICD-10-CM

## 2023-11-24 DIAGNOSIS — Z9981 Dependence on supplemental oxygen: Secondary | ICD-10-CM | POA: Diagnosis not present

## 2023-11-24 DIAGNOSIS — I480 Paroxysmal atrial fibrillation: Secondary | ICD-10-CM | POA: Diagnosis present

## 2023-11-24 DIAGNOSIS — G4733 Obstructive sleep apnea (adult) (pediatric): Secondary | ICD-10-CM | POA: Diagnosis present

## 2023-11-24 DIAGNOSIS — J439 Emphysema, unspecified: Secondary | ICD-10-CM | POA: Diagnosis present

## 2023-11-24 DIAGNOSIS — Z720 Tobacco use: Secondary | ICD-10-CM | POA: Diagnosis not present

## 2023-11-24 DIAGNOSIS — I50810 Right heart failure, unspecified: Secondary | ICD-10-CM | POA: Diagnosis present

## 2023-11-24 DIAGNOSIS — Z79899 Other long term (current) drug therapy: Secondary | ICD-10-CM

## 2023-11-24 DIAGNOSIS — J9621 Acute and chronic respiratory failure with hypoxia: Secondary | ICD-10-CM | POA: Diagnosis present

## 2023-11-24 DIAGNOSIS — I110 Hypertensive heart disease with heart failure: Secondary | ICD-10-CM | POA: Diagnosis present

## 2023-11-24 DIAGNOSIS — J441 Chronic obstructive pulmonary disease with (acute) exacerbation: Principal | ICD-10-CM | POA: Diagnosis present

## 2023-11-24 DIAGNOSIS — F17200 Nicotine dependence, unspecified, uncomplicated: Secondary | ICD-10-CM | POA: Diagnosis present

## 2023-11-24 DIAGNOSIS — Z7901 Long term (current) use of anticoagulants: Secondary | ICD-10-CM | POA: Diagnosis not present

## 2023-11-24 DIAGNOSIS — J9612 Chronic respiratory failure with hypercapnia: Secondary | ICD-10-CM | POA: Diagnosis not present

## 2023-11-24 DIAGNOSIS — J9602 Acute respiratory failure with hypercapnia: Secondary | ICD-10-CM

## 2023-11-24 LAB — CBC WITH DIFFERENTIAL/PLATELET
Abs Immature Granulocytes: 0.03 10*3/uL (ref 0.00–0.07)
Basophils Absolute: 0 10*3/uL (ref 0.0–0.1)
Basophils Relative: 0 %
Eosinophils Absolute: 0 10*3/uL (ref 0.0–0.5)
Eosinophils Relative: 0 %
HCT: 49.6 % — ABNORMAL HIGH (ref 36.0–46.0)
Hemoglobin: 15.4 g/dL — ABNORMAL HIGH (ref 12.0–15.0)
Immature Granulocytes: 0 %
Lymphocytes Relative: 15 %
Lymphs Abs: 1.4 10*3/uL (ref 0.7–4.0)
MCH: 28.9 pg (ref 26.0–34.0)
MCHC: 31 g/dL (ref 30.0–36.0)
MCV: 93.1 fL (ref 80.0–100.0)
Monocytes Absolute: 0.5 10*3/uL (ref 0.1–1.0)
Monocytes Relative: 6 %
Neutro Abs: 7.1 10*3/uL (ref 1.7–7.7)
Neutrophils Relative %: 79 %
Platelets: 245 10*3/uL (ref 150–400)
RBC: 5.33 MIL/uL — ABNORMAL HIGH (ref 3.87–5.11)
RDW: 13.5 % (ref 11.5–15.5)
WBC: 9.1 10*3/uL (ref 4.0–10.5)
nRBC: 0 % (ref 0.0–0.2)

## 2023-11-24 LAB — COMPREHENSIVE METABOLIC PANEL WITH GFR
ALT: 9 U/L (ref 0–44)
AST: 16 U/L (ref 15–41)
Albumin: 3.8 g/dL (ref 3.5–5.0)
Alkaline Phosphatase: 63 U/L (ref 38–126)
Anion gap: 14 (ref 5–15)
BUN: 16 mg/dL (ref 8–23)
CO2: 41 mmol/L — ABNORMAL HIGH (ref 22–32)
Calcium: 8.8 mg/dL — ABNORMAL LOW (ref 8.9–10.3)
Chloride: 84 mmol/L — ABNORMAL LOW (ref 98–111)
Creatinine, Ser: 0.55 mg/dL (ref 0.44–1.00)
GFR, Estimated: >60 mL/min (ref >60)
Glucose, Bld: 119 mg/dL — ABNORMAL HIGH (ref 70–99)
Potassium: 4.4 mmol/L (ref 3.5–5.1)
Sodium: 139 mmol/L (ref 135–145)
Total Bilirubin: 1.6 mg/dL — ABNORMAL HIGH (ref 0.0–1.2)
Total Protein: 6.9 g/dL (ref 6.5–8.1)

## 2023-11-24 LAB — BLOOD GAS, ARTERIAL
Acid-Base Excess: 24.8 mmol/L — ABNORMAL HIGH (ref 0.0–2.0)
Bicarbonate: 57.6 mmol/L — ABNORMAL HIGH (ref 20.0–28.0)
Delivery systems: POSITIVE
Drawn by: 31394
Expiratory PAP: 6 cmH2O
FIO2: 50 %
Inspiratory PAP: 18 cmH2O
O2 Saturation: 98.8 %
Patient temperature: 36.7
RATE: 14 {breaths}/min
pCO2 arterial: 101 mmHg (ref 32–48)
pH, Arterial: 7.36 (ref 7.35–7.45)
pO2, Arterial: 100 mmHg (ref 83–108)

## 2023-11-24 LAB — TROPONIN I (HIGH SENSITIVITY): Troponin I (High Sensitivity): 4 ng/L (ref <18)

## 2023-11-24 MED ORDER — SENNOSIDES-DOCUSATE SODIUM 8.6-50 MG PO TABS: 1.0000 | ORAL_TABLET | Freq: Every evening | ORAL | Status: DC | PRN

## 2023-11-24 MED ORDER — IPRATROPIUM-ALBUTEROL 0.5-2.5 (3) MG/3ML IN SOLN
3.0000 mL | Freq: Once | RESPIRATORY_TRACT | Status: AC
  Administered 2023-11-24: 3 mL via RESPIRATORY_TRACT
  Filled 2023-11-24: qty 3

## 2023-11-24 MED ORDER — IPRATROPIUM-ALBUTEROL 0.5-2.5 (3) MG/3ML IN SOLN: 3.0000 mL | Freq: Four times a day (QID) | RESPIRATORY_TRACT | Status: DC | PRN

## 2023-11-24 MED ORDER — SODIUM CHLORIDE 0.9% FLUSH
3.0000 mL | Freq: Two times a day (BID) | INTRAVENOUS | Status: DC
  Administered 2023-11-24 – 2023-11-27 (×6): 3 mL via INTRAVENOUS

## 2023-11-24 MED ORDER — METOPROLOL SUCCINATE ER 50 MG PO TB24
50.0000 mg | ORAL_TABLET | Freq: Every day | ORAL | Status: DC
  Administered 2023-11-25 – 2023-11-26 (×2): 50 mg via ORAL
  Filled 2023-11-24 (×2): qty 2
  Filled 2023-11-24: qty 1

## 2023-11-24 MED ORDER — ARFORMOTEROL TARTRATE 15 MCG/2ML IN NEBU
15.0000 ug | INHALATION_SOLUTION | Freq: Two times a day (BID) | RESPIRATORY_TRACT | Status: DC
  Administered 2023-11-24 – 2023-11-27 (×6): 15 ug via RESPIRATORY_TRACT
  Filled 2023-11-24 (×6): qty 2

## 2023-11-24 MED ORDER — BUDESONIDE 0.25 MG/2ML IN SUSP
0.2500 mg | Freq: Two times a day (BID) | RESPIRATORY_TRACT | Status: DC
  Administered 2023-11-24 – 2023-11-27 (×6): 0.25 mg via RESPIRATORY_TRACT
  Filled 2023-11-24 (×7): qty 2

## 2023-11-24 MED ORDER — ACETAMINOPHEN 650 MG RE SUPP: 650.0000 mg | Freq: Four times a day (QID) | RECTAL | Status: DC | PRN

## 2023-11-24 MED ORDER — METHIMAZOLE 10 MG PO TABS
20.0000 mg | ORAL_TABLET | Freq: Every day | ORAL | Status: DC
Start: 2023-11-25 — End: 2023-11-27
  Administered 2023-11-25 – 2023-11-27 (×3): 20 mg via ORAL
  Filled 2023-11-24 (×3): qty 2

## 2023-11-24 MED ORDER — CHLORHEXIDINE GLUCONATE CLOTH 2 % EX PADS
6.0000 | MEDICATED_PAD | Freq: Every day | CUTANEOUS | Status: DC
Start: 2023-11-25 — End: 2023-11-27
  Administered 2023-11-25 – 2023-11-27 (×2): 6 via TOPICAL

## 2023-11-24 MED ORDER — METHYLPREDNISOLONE SODIUM SUCC 125 MG IJ SOLR
60.0000 mg | Freq: Two times a day (BID) | INTRAMUSCULAR | Status: DC
  Administered 2023-11-24 – 2023-11-25 (×3): 60 mg via INTRAVENOUS
  Filled 2023-11-24 (×3): qty 2

## 2023-11-24 MED ORDER — ONDANSETRON HCL 4 MG PO TABS: 4.0000 mg | ORAL_TABLET | Freq: Four times a day (QID) | ORAL | Status: DC | PRN

## 2023-11-24 MED ORDER — APIXABAN 5 MG PO TABS
5.0000 mg | ORAL_TABLET | Freq: Two times a day (BID) | ORAL | Status: DC
  Administered 2023-11-24 – 2023-11-27 (×6): 5 mg via ORAL
  Filled 2023-11-24: qty 2
  Filled 2023-11-24 (×5): qty 1

## 2023-11-24 MED ORDER — ONDANSETRON HCL 4 MG/2ML IJ SOLN: 4.0000 mg | Freq: Four times a day (QID) | INTRAMUSCULAR | Status: DC | PRN

## 2023-11-24 MED ORDER — ACETAMINOPHEN 325 MG PO TABS: 650.0000 mg | ORAL_TABLET | Freq: Four times a day (QID) | ORAL | Status: DC | PRN

## 2023-11-24 MED ORDER — ALBUTEROL SULFATE (2.5 MG/3ML) 0.083% IN NEBU
5.0000 mg | INHALATION_SOLUTION | Freq: Once | RESPIRATORY_TRACT | Status: AC
Start: 2023-11-24 — End: 2023-11-24
  Administered 2023-11-24: 5 mg via RESPIRATORY_TRACT
  Filled 2023-11-24: qty 6

## 2023-11-24 NOTE — H&P (Signed)
 History and Physical    Yvette Booth NWG:[id number] DOB: 08-May-1961 DOA: 11/24/2023  PCP: [facility]  Patient coming from: Home  I have personally briefly reviewed patient's old medical records in [facility]  Chief Complaint: Shortness of breath  HPI: Yvette Booth is a 63 y.o. 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  Patient has been admitted 4 times since mid February for management of COPD exacerbations.  Most recently admitted 5/11-5/16 for the same requiring BiPAP on presentation.  She was treated with steroids, bronchodilators, antibiotics and ultimately weaned down to baseline 3L O2 via [place] prior to discharge.  Patient returns today after calling EMS for shortness of breath.  She was given DuoNeb and Solu-Medrol  en route to the ED.  She was somnolent on arrival and placed on BiPAP with improvement in respiratory status.  Patient awakens easily to voice and is following all commands.  ED Course  Labs/Imaging on admission: I have personally reviewed following labs and imaging studies.  Initial vitals showed BP 139/69, pulse 86, RR 18, temp 97.8 F, SpO2 98% on 3L O2 via [place].  Labs showed sodium 139, potassium 4.4, bicarb 41, BUN 16, creatinine 0.55, serum glucose 119, WBC 9.1, hemoglobin 15.4, platelets 245, troponin 4.  ABG pH 7.36, PCO2101, PO2 100 while on BiPAP 50% FiO2.  2 view chest x-ray negative for focal consolidation, edema, effusion.  Patient was given DuoNeb and albuterol  nebulizer treatments.  She was placed on BiPAP and the hospitalist service was consulted to admit.  Review of Systems: All systems reviewed and are negative except as documented in history of present illness above.   Past Medical  History:  Diagnosis Date   A-fib (HCC)    Chronic hypercapnic respiratory failure (HCC)    BIPAP at night   Chronic hypoxic respiratory failure (HCC)    COPD (chronic obstructive pulmonary disease) (HCC)    DVT (deep venous thrombosis) (HCC)    Hypertension    Hyperthyroidism    Ischemic stroke (HCC)    OSA (obstructive sleep apnea)    RVF (right ventricular failure) (HCC)     History reviewed. No pertinent surgical history.  Social History: Ongoing tobacco use.  Patient does not quantify how much she is smoking.  Allergies  Allergen Reactions   Seroquel [Quetiapine] Other (See Comments)    Delirium and confusion     History reviewed. No pertinent family history.   Prior to Admission medications   Medication Sig Start Date End Date Taking? Authorizing Provider  albuterol  (VENTOLIN  HFA) 108 (90 Base) MCG/ACT inhaler Inhale 2 puffs into the lungs every 6 (six) hours as needed for wheezing or shortness of breath.    [provider]  apixaban  (ELIQUIS ) 5 MG TABS tablet Take 5 mg by mouth 2 (two) times daily.    [provider]  fluticasone -salmeterol (ADVAIR) 250-50 MCG/ACT AEPB Inhale 1 puff into the lungs in the morning and at bedtime. 09/01/23   Yvette Blunt, MD  furosemide  (LASIX ) 40 MG tablet Take 40 mg by mouth daily.    [provider]  methimazole  (TAPAZOLE ) 10 MG tablet Take 20 mg by mouth daily.    [provider]  metoprolol  succinate (TOPROL -XL) 50 MG 24 hr tablet Take 50  mg by mouth daily. Take with or immediately following a meal.    [provider]  OXYGEN Inhale 2 L/min into the lungs continuous.    [provider]  umeclidinium bromide  (INCRUSE ELLIPTA ) 62.5 MCG/ACT AEPB Inhale 1 puff into the lungs daily. 09/01/23   Yvette Blunt, MD    Physical Exam: Vitals:   11/24/23 1725 11/24/23 1735 11/24/23 1744 11/24/23 1800  BP:    105/63  Pulse: 75   72  Resp: (!) 27   (!) 22  Temp:  98 F (36.7 C)     TempSrc:  Axillary    SpO2: 93%  95% 95%  Weight:      Height:       Constitutional: Resting in bed with head elevated wearing BiPAP.  NAD, calm. Eyes: EOMI, lids and conjunctivae normal ENMT: Mucous membranes are moist. Posterior pharynx clear of any exudate or lesions.Normal dentition.  Neck: normal, supple, no masses. Respiratory: Coarse expiratory wheezing throughout the lung fields. Normal respiratory effort while on BiPAP. No accessory muscle use.  Cardiovascular: Regular rate and rhythm, no murmurs / rubs / gallops. No extremity edema. 2+ pedal pulses. Abdomen: no tenderness, no masses palpated. Musculoskeletal: no clubbing / cyanosis. No joint deformity upper and lower extremities. Good ROM, no contractures. Normal muscle tone.  Skin: no rashes, lesions, ulcers. No induration Neurologic: Sensation intact. Strength equal bilaterally. Psychiatric: Somewhat somnolent but easily awakens to voice and follows all commands.  Alert and oriented x 3. Normal mood.   EKG: Personally reviewed. Sinus rhythm, rate 75, no acute ischemic changes.  Similar to prior.  Assessment/Plan Principal Problem:   COPD with acute exacerbation (HCC) Active Problems:   Chronic respiratory failure with hypoxia and hypercapnia (HCC)   PAF (paroxysmal atrial fibrillation) (HCC)   Hyperthyroidism   OSA (obstructive sleep apnea)   History of CVA (cerebrovascular accident)   Tobacco abuse   Yvette Booth is a 63 y.o. 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  Assessment and Plan: COPD with acute exacerbation Chronic hypoxic and hypercapnic respiratory failure on 3 L O2 [place] at baseline: In setting of ongoing tobacco use.  Not newly hypoxic on admission but placed on BiPAP due to work of  breathing.  Chronic hypercapnia noted on metabolic panel and ABG. - Continue BiPAP prn and wean down home 3 L O2 [place] when able - IV Solu-Medrol  60 mg twice daily - Scheduled Brovana/Pulmicort with DuoNebs as needed  Paroxysmal atrial fibrillation: Sinus rhythm on admission with controlled rate.  Continue Toprol -XL 50 mg daily and Eliquis  5 mg twice daily.  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  Euvolemic on admission.  Holding home Lasix  for now.  History of CVA: Continue Eliquis .  Hyperthyroidism: Continue methimazole  20 mg daily.  OSA: Continue BiPAP nightly.  Tobacco use: Ongoing tobacco use noted.   DVT prophylaxis: apixaban  (ELIQUIS ) tablet 5 mg Start: 11/24/23 2200 apixaban  (ELIQUIS ) tablet 5 mg  Code Status: Full code, discussed with patient admission Family Communication: None present on admission Disposition Plan: From home, dispo pending clinical progress Consults called: None Severity of Illness: The appropriate patient status for this patient is INPATIENT. Inpatient status is judged to be reasonable and necessary in order to provide the required intensity of service to ensure the patient's safety.  The patient's presenting symptoms, physical exam findings, and initial radiographic and laboratory data in the context of their chronic comorbidities is felt to place them at high risk for further clinical deterioration. Furthermore, it is not anticipated that the patient will be medically stable for discharge from the hospital within 2 midnights of admission.   * I certify that at the point of admission it is my clinical judgment that the patient will require inpatient hospital care spanning beyond 2 midnights from the point of admission due to high intensity of service, high risk for further deterioration and high frequency of surveillance required.Edith Gores MD [facility]  If 7PM-7AM, please contact night-coverage [email or web address]  11/24/2023,  8:17 PM

## 2023-11-24 NOTE — ED Provider Triage Note (Signed)
 Emergency Medicine Provider Triage Evaluation Note  Yvette Booth , a 63 y.o. female  was evaluated in triage.  Pt complains of SOB.  Patient reports woke up from a nap this afternoon and felt short of breath.  She is on 2 L oxygen at baseline.  Was given DuoNeb and Solu-Medrol  by EMS with some improvement of symptoms.  Review of Systems  Positive: Shortness of breath Negative: Chest pain  Physical Exam  BP 139/69   Pulse 86   Temp 97.8 F (36.6 C) (Oral)   Resp 18   Ht 5\' 3"  (1.6 m)   Wt 75.2 kg   SpO2 98%   BMI 29.37 kg/m  Gen:   Awake, no distress  Resp:  Expiratory wheezing throughout, on 5L [place] MSK:   Moves extremities without difficulty  Other:    Medical Decision Making  Medically screening exam initiated at 2:58 PM.  Appropriate orders placed.  Yvette Booth was informed that the remainder of the evaluation will be completed by another provider, this initial triage assessment does not replace that evaluation, and the importance of remaining in the ED until their evaluation is complete.   Yvette Dusky, PA-C 11/24/23 [phone]

## 2023-11-24 NOTE — ED Triage Notes (Signed)
 BIB EMS from home for sob that started after she woke up from a nap today. EMS gave duoneb and solumedrol. Pt reports she feels better. Respirations are equal and unlabored in triage. 98% on 3L baseline oxygen.

## 2023-11-24 NOTE — Hospital Course (Addendum)
 HPI: Yvette Booth is a 62 y.o. 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   Patient has been admitted 4 times since mid February for management of COPD exacerbations.  Most recently admitted 5/11-5/16 for the same requiring BiPAP on presentation.  She was treated with steroids, bronchodilators, antibiotics and ultimately weaned down to baseline 3L O2 via [place] prior to discharge.   Patient returns today after calling EMS for shortness of breath.  She was given DuoNeb and Solu-Medrol  en route to the ED.  She was somnolent on arrival and placed on BiPAP with improvement in respiratory status.  Patient awakens easily to voice and is following all commands.   ED Course  Labs/Imaging on admission: I have personally reviewed following labs and imaging studies.   Initial vitals showed BP 139/69, pulse 86, RR 18, temp 97.8 F, SpO2 98% on 3L O2 via [place].   Labs showed sodium 139, potassium 4.4, bicarb 41, BUN 16, creatinine 0.55, serum glucose 119, WBC 9.1, hemoglobin 15.4, platelets 245, troponin 4.   ABG pH 7.36, PCO2101, PO2 100 while on BiPAP 50% FiO2.   2 view chest x-ray negative for focal consolidation, edema, effusion.   Patient was given DuoNeb and albuterol  nebulizer treatments.  She was placed on BiPAP and the hospitalist service was consulted to admit.  Significant Events: Admitted 11/24/2023 for acute on chronic respiratory failure with hypercabia/hypoxia   Admission Labs: WBC 9.1, HgB 15.4, plt 245 Na 139, K 4.4, CO2 of 41, BUN 16, Scr 0.55, glu 119 ABG pH 7.36, PCO2 of 101  Admission Imaging Studies: CXR No acute cardiopulmonary abnormality   Significant Labs:   Significant Imaging Studies:   Antibiotic Therapy: Anti-infectives (From  admission, onward)    None       Procedures:   Consultants:

## 2023-11-24 NOTE — ED Provider Notes (Signed)
 [place] EMERGENC[facility] DEPARTMENT AT [facility] Provider Note   CSN: [id number] Arrival date & time: 11/24/23  1440     History  Chief Complaint  Patient presents with   Shortness of Breath    Yvette Booth is a 63 y.o. female.  Patient is a 63 year old female who presents with shortness of breath.  She has a history of COPD and is still a daily smoker.  She also has paroxysmal atrial fibrillation on Eliquis  and prior CVA.  On my evaluation, patient is somnolent and not able to give a great story.  On the prior triage notes, patient had complained of shortness of breath that started earlier today.  She is on oxygen at home at 2 L/min.  She was given DuoNeb and Solu-Medrol  by EMS with some improvement in symptoms.  On chart review, she has been admitted several times in the past for COPD exacerbations.  She frequently has had elevated pCO2's on admission requiring BiPAP.       Home Medications Prior to Admission medications   Medication Sig Start Date End Date Taking? Authorizing Provider  albuterol  (VENTOLIN  HFA) 108 (90 Base) MCG/ACT inhaler Inhale 2 puffs into the lungs every 6 (six) hours as needed for wheezing or shortness of breath.    [provider]  apixaban  (ELIQUIS ) 5 MG TABS tablet Take 5 mg by mouth 2 (two) times daily.    [provider]  fluticasone -salmeterol (ADVAIR) 250-50 MCG/ACT AEPB Inhale 1 puff into the lungs in the morning and at bedtime. 09/01/23   Ozell Blunt, MD  furosemide  (LASIX ) 40 MG tablet Take 40 mg by mouth daily.    [provider]  methimazole  (TAPAZOLE ) 10 MG tablet Take 20 mg by mouth daily.    [provider]  metoprolol  succinate (TOPROL -XL) 50 MG 24 hr tablet Take 50 mg by mouth daily. Take with or immediately following a meal.    [provider]  OXYGEN Inhale 2 L/min into the lungs continuous.    [provider]  umeclidinium bromide  (INCRUSE ELLIPTA ) 62.5 MCG/ACT AEPB Inhale 1  puff into the lungs daily. 09/01/23   Ozell Blunt, MD      Allergies    Seroquel [quetiapine]    Review of Systems   Review of Systems  Unable to perform ROS: Mental status change    Physical Exam Updated Vital Signs BP 105/63   Pulse 72   Temp 98 F (36.7 C) (Axillary)   Resp (!) 22   Ht 5\' 3"  (1.6 m)   Wt 75.2 kg   SpO2 95%   BMI 29.37 kg/m  Physical Exam Constitutional:      Appearance: She is well-developed.     Comments: Somnolent but will open her eyes and tell me her name  HENT:     Head: Normocephalic and atraumatic.  Eyes:     Pupils: Pupils are equal, round, and reactive to light.  Cardiovascular:     Rate and Rhythm: Normal rate and regular rhythm.     Heart sounds: Normal heart sounds.  Pulmonary:     Effort: Pulmonary effort is normal. No respiratory distress.     Breath sounds: Decreased breath sounds and wheezing present. No rales.  Chest:     Chest wall: No tenderness.  Abdominal:     General: Bowel sounds are normal.     Palpations: Abdomen is soft.     Tenderness: There is no abdominal tenderness. There is no guarding or  rebound.  Musculoskeletal:        General: Normal range of motion.     Cervical back: Normal range of motion and neck supple.     Comments: No edema to lower extremities, no calf tenderness  Lymphadenopathy:     Cervical: No cervical adenopathy.  Skin:    General: Skin is warm and dry.     Findings: No rash.  Neurological:     Mental Status: She is oriented to person, place, and time.     ED Results / Procedures / Treatments   Labs (all labs ordered are listed, but only abnormal results are displayed) Labs Reviewed  COMPREHENSIVE METABOLIC PANEL WITH GFR - Abnormal; Notable for the following components:      Result Value   Chloride 84 (*)    CO2 41 (*)    Glucose, Bld 119 (*)    Calcium  8.8 (*)    Total Bilirubin 1.6 (*)    All other components within normal limits  CBC WITH DIFFERENTIAL/PLATELET - Abnormal;  Notable for the following components:   RBC 5.33 (*)    Hemoglobin 15.4 (*)    HCT 49.6 (*)    All other components within normal limits  BLOOD GAS, ARTERIAL - Abnormal; Notable for the following components:   pCO2 arterial 101 (*)    Bicarbonate 57.6 (*)    Acid-Base Excess 24.8 (*)    All other components within normal limits  TROPONIN I (HIGH SENSITIVITY)  TROPONIN I (HIGH SENSITIVITY)    EKG EKG Interpretation Date/Time:  Wednesday Nov 24 2023 15:14:32 EDT Ventricular Rate:  75 PR Interval:  137 QRS Duration:  98 QT Interval:  419 QTC Calculation: 468 R Axis:   110  Text Interpretation: Sinus rhythm Right axis deviation Similar to May 11th tracing Confirmed by Abby Hocking [phone]) on 11/24/2023 3:17:34 PM  Radiology DG Chest 2 View Result Date: 11/24/2023 CLINICAL DATA:  SOB EXAM: CHEST - 2 VIEW COMPARISON:  None available. FINDINGS: No focal airspace consolidation, pleural effusion, or pneumothorax. No cardiomegaly. No acute fracture or destructive lesion. IMPRESSION: No acute cardiopulmonary abnormality. Electronically Signed   By: Rance Burrows M.D.   On: 11/24/2023 16:45    Procedures Procedures    Medications Ordered in ED Medications  ipratropium-albuterol  (DUONEB) 0.5-2.5 (3) MG/3ML nebulizer solution 3 mL (3 mLs Nebulization Given 11/24/23 1514)  albuterol  (PROVENTIL ) (2.5 MG/3ML) 0.083% nebulizer solution 5 mg (5 mg Nebulization Given 11/24/23 1744)    ED Course/ Medical Decision Making/ A&P                                 Medical Decision Making Amount and/or Complexity of Data Reviewed Labs: ordered.  Risk Prescription drug management. Decision regarding hospitalization.   Patient is a 63 year old female who presents with shortness of breath.  She has a history of COPD.  She was fairly somnolent on my exam although she will wake up and tell me her name but then goes back to sleep.  Chest x-ray was reviewed by me and read by the radiologist.  Does  not show any evidence of pneumonia.  No pneumothorax.  No pulmonary edema.  Labs reviewed and are nonconcerning.  Her troponins are normal.  pCO2 was elevated at 101.  Patient was started on BiPAP.  She is somnolent but will wake up and talk to me.  Feel at this point that she is stable on the BiPAP  and does not need intubation.  On chart review, she has had several admissions for similar type episodes with somnolence/hypercapnia.  Discussed with Dr. Lowell Rude who will admit the patient for further treatment.  CRITICAL CARE Performed by: Hershel Los Total critical care time: 60 minutes Critical care time was exclusive of separately billable procedures and treating other patients. Critical care was necessary to treat or prevent imminent or life-threatening deterioration. Critical care was time spent personally by me on the following activities: development of treatment plan with patient and/or surrogate as well as nursing, discussions with consultants, evaluation of patient's response to treatment, examination of patient, obtaining history from patient or surrogate, ordering and performing treatments and interventions, ordering and review of laboratory studies, ordering and review of radiographic studies, pulse oximetry and re-evaluation of patient's condition.   Final Clinical Impression(s) / ED Diagnoses Final diagnoses:  COPD exacerbation (HCC)  Acute respiratory failure with hypercapnia [facility])    Rx / DC Orders ED Discharge Orders     None         Hershel Los, MD 11/24/23 1851

## 2023-11-25 ENCOUNTER — Encounter (HOSPITAL_COMMUNITY): Payer: Self-pay | Admitting: Internal Medicine

## 2023-11-25 DIAGNOSIS — G4733 Obstructive sleep apnea (adult) (pediatric): Secondary | ICD-10-CM

## 2023-11-25 DIAGNOSIS — J9612 Chronic respiratory failure with hypercapnia: Secondary | ICD-10-CM

## 2023-11-25 DIAGNOSIS — J441 Chronic obstructive pulmonary disease with (acute) exacerbation: Secondary | ICD-10-CM | POA: Diagnosis not present

## 2023-11-25 DIAGNOSIS — I480 Paroxysmal atrial fibrillation: Secondary | ICD-10-CM

## 2023-11-25 DIAGNOSIS — E0590 Thyrotoxicosis, unspecified without thyrotoxic crisis or storm: Secondary | ICD-10-CM

## 2023-11-25 DIAGNOSIS — J9621 Acute and chronic respiratory failure with hypoxia: Secondary | ICD-10-CM

## 2023-11-25 DIAGNOSIS — J9611 Chronic respiratory failure with hypoxia: Secondary | ICD-10-CM

## 2023-11-25 DIAGNOSIS — J9622 Acute and chronic respiratory failure with hypercapnia: Secondary | ICD-10-CM

## 2023-11-25 LAB — CBC
HCT: 47.5 % — ABNORMAL HIGH (ref 36.0–46.0)
Hemoglobin: 14.4 g/dL (ref 12.0–15.0)
MCH: 28.4 pg (ref 26.0–34.0)
MCHC: 30.3 g/dL (ref 30.0–36.0)
MCV: 93.7 fL (ref 80.0–100.0)
Platelets: 210 10*3/uL (ref 150–400)
RBC: 5.07 MIL/uL (ref 3.87–5.11)
RDW: 13.5 % (ref 11.5–15.5)
WBC: 6.6 10*3/uL (ref 4.0–10.5)
nRBC: 0 % (ref 0.0–0.2)

## 2023-11-25 LAB — BASIC METABOLIC PANEL WITH GFR
Anion gap: 15 (ref 5–15)
BUN: 23 mg/dL (ref 8–23)
CO2: 41 mmol/L — ABNORMAL HIGH (ref 22–32)
Calcium: 8.8 mg/dL — ABNORMAL LOW (ref 8.9–10.3)
Chloride: 80 mmol/L — ABNORMAL LOW (ref 98–111)
Creatinine, Ser: 0.66 mg/dL (ref 0.44–1.00)
GFR, Estimated: >60 mL/min (ref >60)
Glucose, Bld: 158 mg/dL — ABNORMAL HIGH (ref 70–99)
Potassium: 3.8 mmol/L (ref 3.5–5.1)
Sodium: 136 mmol/L (ref 135–145)

## 2023-11-25 MED ORDER — ACETAZOLAMIDE 250 MG PO TABS
500.0000 mg | ORAL_TABLET | Freq: Two times a day (BID) | ORAL | Status: DC
  Administered 2023-11-25 – 2023-11-27 (×5): 500 mg via ORAL
  Filled 2023-11-25 (×6): qty 2

## 2023-11-25 MED ORDER — ORAL CARE MOUTH RINSE: 15.0000 mL | OROMUCOSAL | Status: DC | PRN

## 2023-11-25 NOTE — Progress Notes (Signed)
 PROGRESS NOTE    Yvette Booth  ZOX:[id number] DOB: Sep 18, 1960 DOA: 11/24/2023 PCP: [facility]  Subjective: Pt seen and examined. Awake on Bipap. States she has bipap at home and uses it.  Based on her admission ABG, her baseline PCO2 is probably mid 80s mmHg, given her pH was 7.36 and PCO2 was 101.   Hospital Course: HPI: Yvette Booth is a 63 y.o. 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   Patient has been admitted 4 times since mid February for management of COPD exacerbations.  Most recently admitted 5/11-5/16 for the same requiring BiPAP on presentation.  She was treated with steroids, bronchodilators, antibiotics and ultimately weaned down to baseline 3L O2 via [place] prior to discharge.   Patient returns today after calling EMS for shortness of breath.  She was given DuoNeb and Solu-Medrol  en route to the ED.  She was somnolent on arrival and placed on BiPAP with improvement in respiratory status.  Patient awakens easily to voice and is following all commands.   ED Course  Labs/Imaging on admission: I have personally reviewed following labs and imaging studies.   Initial vitals showed BP 139/69, pulse 86, RR 18, temp 97.8 F, SpO2 98% on 3L O2 via [place].   Labs showed sodium 139, potassium 4.4, bicarb 41, BUN 16, creatinine 0.55, serum glucose 119, WBC 9.1, hemoglobin 15.4, platelets 245, troponin 4.   ABG pH 7.36, PCO2101, PO2 100 while on BiPAP 50% FiO2.   2 view chest x-ray negative for focal consolidation, edema, effusion.   Patient was given DuoNeb and albuterol  nebulizer treatments.  She was placed on BiPAP and the hospitalist service was consulted to admit.  Significant Events: Admitted 11/24/2023 for acute on chronic  respiratory failure with hypercabia/hypoxia   Admission Labs: WBC 9.1, HgB 15.4, plt 245 Na 139, K 4.4, CO2 of 41, BUN 16, Scr 0.55, glu 119 ABG pH 7.36, PCO2 of 101  Admission Imaging Studies: CXR No acute cardiopulmonary abnormality   Significant Labs:   Significant Imaging Studies:   Antibiotic Therapy: Anti-infectives (From admission, onward)    None       Procedures:   Consultants:     Assessment and Plan: * COPD with acute exacerbation (HCC) 11-25-2023 continue with IV solumedrol. Consider changing to po prednisone  tomorrow. Continue with nebs.  Chronic respiratory failure with hypoxia and hypercapnia (HCC) - on baseline 3 L/min, baseline PCO2 about 95 mmHg 11-25-2023 chronically on 3 L/min and bipap at home.  Baseline PCO2 is probably close to 95 mmHg on a regular basis.  Acute on chronic respiratory failure with hypoxia and hypercapnia (HCC) 11-25-2023 continue with IV solumedrol for COPD exacerbation. Continue with Bipap. Add Diamox after checking VBG to assess her pH.  Tobacco abuse 11-25-2023 pt advised to stop smoking.  History of CVA (cerebrovascular accident) 11-25-2023 stable.  Obstructive sleep apnea treated with bilevel positive airway pressure (BiPAP) 11-25-2023 pt verifies that she had home Bipap machine.  PAF (paroxysmal atrial fibrillation) (HCC) 11-25-2023 continue with Eliquis , Toprol -XL 50 mg daily.  Hyperthyroidism 11-24-2023 continue with tapazole    DVT prophylaxis:  apixaban  (ELIQUIS ) tablet 5 mg     Code Status: Full Code Family Communication: no family at bedside Disposition Plan: return home Reason for continuing need  for hospitalization: remains on Bipap, IV solumedrol.  Objective: Vitals:   11/25/23 0800 11/25/23 0828 11/25/23 0834 11/25/23 0914  BP: (!) 158/75   (!) 155/62  Pulse: 77   85  Resp: (!) 27   16  Temp:      TempSrc:      SpO2: 94% 95% 95% 95%  Weight:      Height:        Intake/Output Summary  (Last 24 hours) at 11/25/2023 1000 Last data filed at 11/25/2023 0010 Gross per 24 hour  Intake 0 ml  Output --  Net 0 ml   Filed Weights   11/24/23 1454 11/25/23 0010 11/25/23 0500  Weight: 75.2 kg 75.1 kg 74.1 kg    Examination:  Physical Exam Vitals and nursing note reviewed.  Constitutional:      General: She is not in acute distress.    Appearance: She is not toxic-appearing.     Comments: Chronically ill appearing  HENT:     Head: Normocephalic and atraumatic.  Eyes:     General: No scleral icterus. Cardiovascular:     Rate and Rhythm: Normal rate and regular rhythm.  Pulmonary:     Effort: Pulmonary effort is normal.     Comments: On bipap Musical rhonchi bilateral upper lobes Abdominal:     General: Abdomen is flat. Bowel sounds are normal.     Palpations: Abdomen is soft.  Musculoskeletal:     Right lower leg: No edema.     Left lower leg: No edema.  Skin:    General: Skin is warm and dry.     Capillary Refill: Capillary refill takes less than 2 seconds.  Neurological:     Mental Status: She is alert and oriented to person, place, and time.     Data Reviewed: I have personally reviewed following labs and imaging studies  CBC: Recent Labs  Lab 11/24/23 1500 11/25/23 0339  WBC 9.1 6.6  NEUTROABS 7.1  --   HGB 15.4* 14.4  HCT 49.6* 47.5*  MCV 93.1 93.7  PLT 245 210   Basic Metabolic Panel: Recent Labs  Lab 11/24/23 1500 11/25/23 0339  NA 139 136  K 4.4 3.8  CL 84* 80*  CO2 41* 41*  GLUCOSE 119* 158*  BUN 16 23  CREATININE 0.55 0.66  CALCIUM  8.8* 8.8*   GFR: Estimated Creatinine Clearance: 69.4 mL/min (by C-G formula based on SCr of 0.66 mg/dL). Liver Function Tests: Recent Labs  Lab 11/24/23 1500  AST 16  ALT 9  ALKPHOS 63  BILITOT 1.6*  PROT 6.9  ALBUMIN 3.8   BNP (last 3 results) Recent Labs    08/21/23 0317 08/30/23 1415  BNP 37.7 64.7   Arterial Blood Gas: Lab Results  Component Value Date/Time   PHART 7.36  11/24/2023 05:35 PM   PCO2ART 101 (HH) 11/24/2023 05:35 PM   PO2ART 100 11/24/2023 05:35 PM   HCO3 57.6 (H) 11/24/2023 05:35 PM   O2SAT 98.8 11/24/2023 05:35 PM   FIO2 50 11/24/2023 05:35 PM   LHR 14 11/24/2023 05:35 PM   IPAP 18 11/24/2023 05:35 PM      Recent Results (from the past 240 hours)  Expectorated Sputum Assessment w Gram Stain, Rflx to Resp Cult     Status: None   Collection Time: 11/15/23  2:00 PM   Specimen: Expectorated Sputum  Result Value Ref Range Status   Specimen Description EXPECTORATED SPUTUM  Final   Special Requests NONE  Final   Sputum  evaluation   Final    THIS SPECIMEN IS ACCEPTABLE FOR SPUTUM CULTURE Performed at [facility], [address], [place], [place] [address]    Report Status 11/15/2023 FINAL  Final  Culture, Respiratory w Gram Stain     Status: None   Collection Time: 11/15/23  2:00 PM  Result Value Ref Range Status   Specimen Description   Final    EXPECTORATED SPUTUM Performed at [facility], [address] [place], [place] [address]    Special Requests   Final    NONE Reflexed from [id number] Performed at [facility], [address], [place], [place] [address]    Gram Stain   Final    FEW WBC PRESENT, PREDOMINANTLY MONONUCLEAR RARE GRAM POSITIVE COCCI IN SINGLES RARE GRAM VARIABLE ROD    Culture   Final    RARE Normal respiratory flora-no Staph aureus or Pseudomonas seen Performed at [facility], [address], [place], [place] [address]    Report Status 11/18/2023 FINAL  Final     Radiology Studies: DG Chest 2 View Result Date: 11/24/2023 CLINICAL DATA:  SOB EXAM: CHEST - 2 VIEW COMPARISON:  None available. FINDINGS: No focal airspace consolidation, pleural effusion, or pneumothorax. No cardiomegaly. No acute fracture or destructive lesion. IMPRESSION: No acute cardiopulmonary abnormality. Electronically Signed   By: Rance Burrows M.D.   On: 11/24/2023 16:45     Scheduled Meds:  apixaban   5 mg Oral BID   arformoterol  15 mcg Nebulization BID   budesonide (PULMICORT) nebulizer solution  0.25 mg Nebulization BID   Chlorhexidine  Gluconate Cloth  6 each Topical Daily   methimazole   20 mg Oral Daily   methylPREDNISolone  (SOLU-MEDROL ) injection  60 mg Intravenous Q12H   metoprolol  succinate  50 mg Oral Daily   sodium chloride  flush  3 mL Intravenous Q12H   Continuous Infusions:   LOS: 1 day   Time spent: 60 minutes  Unk Garb, DO  [facility]  11/25/2023, 10:00 AM

## 2023-11-25 NOTE — Progress Notes (Signed)
   Venous Blood Gas: Lab Results  Component Value Date/Time   PHVEN 7.42 11/25/2023 10:38 AM   PCO2VEN 96 (HH) 11/25/2023 10:38 AM   PO2VEN 38 11/25/2023 10:38 AM   HCO3 62.3 (H) 11/25/2023 10:38 AM   O2SAT 73.1 11/25/2023 10:38 AM   Given pH is 7.42, will start diamox 500 mg bid  Unk Garb, DO [facility]

## 2023-11-25 NOTE — Progress Notes (Signed)
 RT transported pt from ED to 1238 without complications.

## 2023-11-25 NOTE — Assessment & Plan Note (Addendum)
 11-25-2023 continue with IV solumedrol for COPD exacerbation. Continue with Bipap. Add Diamox after checking VBG to assess her pH.  11-26-2023 baseline PCO2 probably around 95 mmHg all the time. Added Diamox yesterday in hopes to drive her serum bicarb down and stimulate faster respiratory drive to ventilate her PCO2 down.  Venous Blood Gas: Recent Labs    11/24/23 1735 11/25/23 1038 11/26/23 0337  PHVEN  --  7.42 7.32  PCO2VEN  --  96* 108*  PO2VEN  --  38 <31*  HCO3 57.6* 62.3* 56.2*  O2SAT 98.8 73.1 55.1   11-27-2023 pt tolerating po diamox. Needs to see her PCP in office in 1 week to get BMP check to monitor serum bicarb level.  Hopefully by adding PO diamox, we can drive her metabolic alkalosis down some to stimulate her breathing more than therefore drive down her baseline PCO2.

## 2023-11-25 NOTE — Assessment & Plan Note (Addendum)
 11-25-2023 continue with Eliquis , Toprol -XL 50 mg daily.  11-26-2023 stable  11-27-2023 stable

## 2023-11-25 NOTE — Assessment & Plan Note (Addendum)
 11-25-2023 pt verifies that she had home Bipap machine.  11-26-2023 stable  11-27-2023 stable

## 2023-11-25 NOTE — Assessment & Plan Note (Addendum)
 11-25-2023 chronically on 3 L/min and bipap at home.  Baseline PCO2 is probably close to 95 mmHg on a regular basis.  11-26-2023 baseline PCO2 probably around 95 mmHg all the time. Added Diamox yesterday in hopes to drive her serum bicarb down and stimulate faster respiratory drive to ventilate her PCO2 down.  11-27-2023 pt tolerating po diamox. Needs to see her PCP in office in 1 week to get BMP check to monitor serum bicarb level. Hopefully by adding PO diamox, we can drive her metabolic alkalosis down some to stimulate her breathing more than therefore drive down her baseline PCO2.

## 2023-11-25 NOTE — Assessment & Plan Note (Addendum)
 11-25-2023 stable.  11-26-2023 stable  11-27-2023 stable.   11-28-2023 stable  11-28-2023 stable.

## 2023-11-25 NOTE — Subjective & Objective (Addendum)
 Pt seen and examined.  Tolerated po prednisone . Wore bipap. Tolerating diamox. Pt still requesting to go home. She is AxOx4.  Has home O2, portable O2, home bipap, home neb machine.

## 2023-11-25 NOTE — Plan of Care (Signed)
 All care plans initiated, reviewed with patient, time given for questions. BiPAP on saturations above 92%, bed alarm on, call bell in reach.  Problem: Health Behavior/Discharge Planning: Goal: Ability to manage health-related needs will improve Outcome: Progressing   Problem: Clinical Measurements: Goal: Ability to maintain clinical measurements within normal limits will improve Outcome: Progressing Goal: Will remain free from infection Outcome: Progressing Goal: Diagnostic test results will improve Outcome: Progressing Goal: Respiratory complications will improve Outcome: Progressing   Problem: Activity: Goal: Risk for activity intolerance will decrease Outcome: Progressing   Problem: Nutrition: Goal: Adequate nutrition will be maintained Outcome: Progressing   Problem: Coping: Goal: Level of anxiety will decrease Outcome: Progressing   Problem: Elimination: Goal: Will not experience complications related to bowel motility Outcome: Progressing Goal: Will not experience complications related to urinary retention Outcome: Progressing   Problem: Pain Managment: Goal: General experience of comfort will improve and/or be controlled Outcome: Progressing   Problem: Safety: Goal: Ability to remain free from injury will improve Outcome: Progressing   Problem: Skin Integrity: Goal: Risk for impaired skin integrity will decrease Outcome: Progressing   Problem: Education: Goal: Knowledge of disease or condition will improve Outcome: Progressing Goal: Individualized Educational Video(s) Outcome: Progressing   Problem: Activity: Goal: Ability to tolerate increased activity will improve Outcome: Progressing   Problem: Respiratory: Goal: Ability to maintain a clear airway will improve Outcome: Progressing Goal: Levels of oxygenation will improve Outcome: Progressing Goal: Ability to maintain adequate ventilation will improve Outcome: Progressing

## 2023-11-25 NOTE — Assessment & Plan Note (Addendum)
 11-25-2023 continue with tapazole   11-26-2023 stable  11-27-2023 stable

## 2023-11-25 NOTE — Assessment & Plan Note (Signed)
 11-25-2023 pt advised to stop smoking.

## 2023-11-25 NOTE — Assessment & Plan Note (Addendum)
 11-25-2023 continue with IV solumedrol. Consider changing to po prednisone  tomorrow. Continue with nebs.  11-26-2023 change to prednisone . Pt verifies she has all equipment at home including O2 and Bipap machine. Will consult PT/OT to make sure she can still walk.  11-27-2023 pt ambulating. DC to home with 12 days prednisone  taper.

## 2023-11-25 NOTE — TOC Initial Note (Addendum)
 Transition of Care [facility]) - Initial/Assessment Note    Patient Details  Name: Yvette Booth MRN: [id number] Date of Birth: 1961-03-12  [facility]) CM/SW Contact:    Levie Ream, RN Phone Number: 11/25/2023, 12:07 PM  Clinical Narrative:                 Yvette Booth w/ pt in room; pt says she lives at home w/ her family; she plans to return at d/c; pt identified POC dtr Yvette Booth [phone]); pt says family will provide transportation; pt verified insurance/PCP; she denied SDOH risks; pt says she has walker, BSC, and shower chair; she has home oxygen w/ [employer]; pt says she has a full travel tank, and family will bring it at d/c; TOC is following.  Expected Discharge Plan: Home/Self Care Barriers to Discharge: Continued Medical Work up   Patient Goals and CMS Choice Patient states their goals for this hospitalization and ongoing recovery are:: home CMS Medicare.gov Compare Post Acute Care list provided to:: Patient   [place] ownership interest in [facility].provided to:: Patient    Expected Discharge Plan and Services   Discharge Planning Services: CM Consult   Living arrangements for the past 2 months: Apartment                                      Prior Living Arrangements/Services Living arrangements for the past 2 months: Apartment Lives with:: Relatives Patient language and need for interpreter reviewed:: Yes Do you feel safe going back to the place where you live?: Yes      Need for Family Participation in Patient Care: Yes (Comment) Care giver support system in place?: Yes (comment) Current home services: DME (walker, BSC, shower chair, home oxygen w/ [employer]) Criminal Activity/Legal Involvement Pertinent to Current Situation/Hospitalization: No - Comment as needed  Activities of Daily Living   ADL Screening (condition at time of admission) Independently performs ADLs?: Yes (appropriate for developmental age) Is the  patient deaf or have difficulty hearing?: No Does the patient have difficulty seeing, even when wearing glasses/contacts?: No Does the patient have difficulty concentrating, remembering, or making decisions?: No  Permission Sought/Granted Permission sought to share information with : Case Manager Permission granted to share information with : Yes, Verbal Permission Granted  Share Information with NAME: Case Manager     Permission granted to share info w Relationship: Moraima Burd (dtr) [phone]     Emotional Assessment Appearance:: Appears stated age Attitude/Demeanor/Rapport: Gracious Affect (typically observed): Accepting Orientation: : Oriented to Self, Oriented to Place, Oriented to  Time, Oriented to Situation Alcohol / Substance Use: Not Applicable Psych Involvement: No (comment)  Admission diagnosis:  COPD exacerbation (HCC) [J44.1] Acute respiratory failure with hypercapnia (HCC) [J96.02] COPD with acute exacerbation (HCC) [J44.1] Patient Active Problem List   Diagnosis Date Noted   Chronic respiratory failure with hypoxia and hypercapnia (HCC) - on baseline 3 L/min, baseline PCO2 about 95 mmHg 11/24/2023   COPD with acute exacerbation (HCC) 11/24/2023   Tobacco abuse 11/15/2023   Hyperthyroidism 08/21/2023   PAF (paroxysmal atrial fibrillation) (HCC) 08/21/2023   Obstructive sleep apnea treated with bilevel positive airway pressure (BiPAP) 08/21/2023   History of CVA (cerebrovascular accident) 08/21/2023   Acute on chronic respiratory failure with hypoxia and hypercapnia (HCC) 08/20/2023   Encounter for smoking cessation counseling 08/20/2023   PCP:  [facility] Pharmacy:  [facility] - [place], [place] - [address] [place] [address] Phone: [phone] Fax: [phone]     Social Drivers of Health (SDOH) Social History: SDOH Screenings   Food Insecurity: No Food Insecurity (11/25/2023)   Housing: Low Risk  (11/25/2023)  Transportation Needs: No Transportation Needs (11/25/2023)  Utilities: Not At Risk (11/25/2023)  Financial Resource Strain: Low Risk  (12/16/2022)   [facility] from [facility]  Physical Activity: Not on File (10/23/2021)   Received from [facility]  Social Connections: Moderately Isolated (11/25/2023)  Stress: Not on File (10/23/2021)   Received from [facility]  Tobacco Use: High Risk (11/25/2023)   SDOH Interventions: Food Insecurity Interventions: Intervention Not Indicated, Inpatient TOC Housing Interventions: Intervention Not Indicated, Inpatient TOC Transportation Interventions: Intervention Not Indicated, Inpatient TOC Utilities Interventions: Intervention Not Indicated, Inpatient TOC   Readmission Risk Interventions    11/25/2023   11:55 AM 08/22/2023   10:27 AM  Readmission Risk Prevention Plan  Post Dischage Appt  Complete  Medication Screening  Complete  Transportation Screening Complete Complete  PCP or Specialist Appt within 5-7 Days Complete   Home Care Screening Complete   Medication Review (RN CM) Complete

## 2023-11-26 DIAGNOSIS — J9621 Acute and chronic respiratory failure with hypoxia: Secondary | ICD-10-CM | POA: Diagnosis not present

## 2023-11-26 DIAGNOSIS — J9611 Chronic respiratory failure with hypoxia: Secondary | ICD-10-CM | POA: Diagnosis not present

## 2023-11-26 DIAGNOSIS — G4733 Obstructive sleep apnea (adult) (pediatric): Secondary | ICD-10-CM | POA: Diagnosis not present

## 2023-11-26 DIAGNOSIS — J441 Chronic obstructive pulmonary disease with (acute) exacerbation: Secondary | ICD-10-CM | POA: Diagnosis not present

## 2023-11-26 LAB — CBC WITH DIFFERENTIAL/PLATELET
Abs Immature Granulocytes: 0.07 10*3/uL (ref 0.00–0.07)
Basophils Absolute: 0 10*3/uL (ref 0.0–0.1)
Basophils Relative: 0 %
Eosinophils Absolute: 0 10*3/uL (ref 0.0–0.5)
Eosinophils Relative: 0 %
HCT: 49 % — ABNORMAL HIGH (ref 36.0–46.0)
Hemoglobin: 14.6 g/dL (ref 12.0–15.0)
Immature Granulocytes: 1 %
Lymphocytes Relative: 5 %
Lymphs Abs: 0.5 10*3/uL — ABNORMAL LOW (ref 0.7–4.0)
MCH: 28.6 pg (ref 26.0–34.0)
MCHC: 29.8 g/dL — ABNORMAL LOW (ref 30.0–36.0)
MCV: 96.1 fL (ref 80.0–100.0)
Monocytes Absolute: 0.2 10*3/uL (ref 0.1–1.0)
Monocytes Relative: 2 %
Neutro Abs: 9.2 10*3/uL — ABNORMAL HIGH (ref 1.7–7.7)
Neutrophils Relative %: 92 %
Platelets: 243 10*3/uL (ref 150–400)
RBC: 5.1 MIL/uL (ref 3.87–5.11)
RDW: 13.5 % (ref 11.5–15.5)
WBC: 10 10*3/uL (ref 4.0–10.5)
nRBC: 0 % (ref 0.0–0.2)

## 2023-11-26 LAB — BASIC METABOLIC PANEL WITH GFR
Anion gap: 11 (ref 5–15)
BUN: 28 mg/dL — ABNORMAL HIGH (ref 8–23)
CO2: 44 mmol/L — ABNORMAL HIGH (ref 22–32)
Calcium: 9.5 mg/dL (ref 8.9–10.3)
Chloride: 85 mmol/L — ABNORMAL LOW (ref 98–111)
Creatinine, Ser: 0.48 mg/dL (ref 0.44–1.00)
GFR, Estimated: >60 mL/min (ref >60)
Glucose, Bld: 136 mg/dL — ABNORMAL HIGH (ref 70–99)
Potassium: 3.6 mmol/L (ref 3.5–5.1)
Sodium: 140 mmol/L (ref 135–145)

## 2023-11-26 LAB — BLOOD GAS, VENOUS
Acid-Base Excess: 30.8 mmol/L — ABNORMAL HIGH (ref 0.0–2.0)
Acid-Base Excess: 23.7 mmol/L — ABNORMAL HIGH (ref 0.0–2.0)
Bicarbonate: 62.3 mmol/L — ABNORMAL HIGH (ref 20.0–28.0)
Bicarbonate: 56.2 mmol/L — ABNORMAL HIGH (ref 20.0–28.0)
O2 Saturation: 73.1 %
O2 Saturation: 55.1 %
Patient temperature: 37
Patient temperature: 36.9
pCO2, Ven: 96 mmHg (ref 44–60)
pCO2, Ven: 108 mmHg (ref 44–60)
pH, Ven: 7.42 (ref 7.25–7.43)
pH, Ven: 7.32 (ref 7.25–7.43)
pO2, Ven: 38 mmHg (ref 32–45)
pO2, Ven: <31 mmHg — CL (ref 32–45)

## 2023-11-26 MED ORDER — IPRATROPIUM-ALBUTEROL 0.5-2.5 (3) MG/3ML IN SOLN: RESPIRATORY_TRACT | 0 refills | Status: DC

## 2023-11-26 MED ORDER — ACETAZOLAMIDE 250 MG PO TABS: 250.0000 mg | ORAL_TABLET | Freq: Two times a day (BID) | ORAL | 0 refills | Status: AC

## 2023-11-26 MED ORDER — PREDNISONE 20 MG PO TABS: ORAL_TABLET | ORAL | 0 refills | Status: AC

## 2023-11-26 MED ORDER — PREDNISONE 20 MG PO TABS
40.0000 mg | ORAL_TABLET | Freq: Every day | ORAL | Status: DC
  Administered 2023-11-26 – 2023-11-27 (×2): 40 mg via ORAL
  Filled 2023-11-26 (×2): qty 2

## 2023-11-26 NOTE — Progress Notes (Signed)
   11/26/23 2200  BiPAP/CPAP/SIPAP  BiPAP/CPAP/SIPAP SERVO  Mask Type Full face mask  Dentures removed? Yes - Placed in denture cup  Mask Size Medium  Set Rate 16 breaths/min  Respiratory Rate 22 breaths/min  IPAP 18 cmH20  EPAP 6 cmH2O  Pressure Support 12 cmH20  PEEP 6 cmH20  FiO2 (%) 40 %  Minute Ventilation 10.5  Leak 75  Peak Inspiratory Pressure (PIP) 17  Tidal Volume (Vt) 542  Patient Home Machine No  Patient Home Mask No  Patient Home Tubing No  Auto Titrate No  Press High Alarm 30 cmH2O  Nasal massage performed Yes  CPAP/SIPAP surface wiped down Yes  Device Plugged into RED Power Outlet Yes  BiPAP/CPAP /SiPAP Vitals  Resp 17  SpO2 96 %  MEWS Score/Color  MEWS Score 0  MEWS Score Color Green

## 2023-11-26 NOTE — Progress Notes (Signed)
 Assumed care from previous nurse.Agree with initial assessment and care.

## 2023-11-26 NOTE — Progress Notes (Signed)
 PROGRESS NOTE    Yvette Booth  ZOX:[id number] DOB: 25-Dec-1960 DOA: 11/24/2023 PCP: [facility]  Subjective: Pt seen and examined. Pt is awake and alert. Wants to go home. Tolerating IV solumedrol. Breathing is better. VBG this AM shows: Venous Blood Gas: Lab Results  Component Value Date/Time   PHVEN 7.32 11/26/2023 03:37 AM   PCO2VEN 108 (HH) 11/26/2023 03:37 AM   PO2VEN <31 (LL) 11/26/2023 03:37 AM   HCO3 56.2 (H) 11/26/2023 03:37 AM   O2SAT 55.1 11/26/2023 03:37 AM   Pt started on po diamox 500 mg bid to try and reduce her serum bicarbonate.   Hospital Course: HPI: Yvette Booth is a 63 y.o. 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   Patient has been admitted 4 times since mid February for management of COPD exacerbations.  Most recently admitted 5/11-5/16 for the same requiring BiPAP on presentation.  She was treated with steroids, bronchodilators, antibiotics and ultimately weaned down to baseline 3L O2 via [place] prior to discharge.   Patient returns today after calling EMS for shortness of breath.  She was given DuoNeb and Solu-Medrol  en route to the ED.  She was somnolent on arrival and placed on BiPAP with improvement in respiratory status.  Patient awakens easily to voice and is following all commands.   ED Course  Labs/Imaging on admission: I have personally reviewed following labs and imaging studies.   Initial vitals showed BP 139/69, pulse 86, RR 18, temp 97.8 F, SpO2 98% on 3L O2 via [place].   Labs showed sodium 139, potassium 4.4, bicarb 41, BUN 16, creatinine 0.55, serum glucose 119, WBC 9.1, hemoglobin 15.4, platelets 245, troponin 4.   ABG pH 7.36, PCO2101, PO2 100 while on BiPAP 50% FiO2.   2 view chest  x-ray negative for focal consolidation, edema, effusion.   Patient was given DuoNeb and albuterol  nebulizer treatments.  She was placed on BiPAP and the hospitalist service was consulted to admit.  Significant Events: Admitted 11/24/2023 for acute on chronic respiratory failure with hypercabia/hypoxia   Admission Labs: WBC 9.1, HgB 15.4, plt 245 Na 139, K 4.4, CO2 of 41, BUN 16, Scr 0.55, glu 119 ABG pH 7.36, PCO2 of 101  Admission Imaging Studies: CXR No acute cardiopulmonary abnormality   Significant Labs:   Significant Imaging Studies:   Antibiotic Therapy: Anti-infectives (From admission, onward)    None       Procedures:   Consultants:     Assessment and Plan: * COPD with acute exacerbation (HCC) 11-25-2023 continue with IV solumedrol. Consider changing to po prednisone  tomorrow. Continue with nebs.  11-26-2023 change to prednisone . Pt verifies she has all equipment at home including O2 and Bipap machine. Will consult PT/OT to make sure she can still walk.  Chronic respiratory failure with hypoxia and hypercapnia (HCC) - on baseline 3 L/min, baseline PCO2 about 95 mmHg 11-25-2023 chronically on 3 L/min and bipap at home.  Baseline PCO2 is probably close to 95 mmHg on a regular basis.  11-26-2023 baseline PCO2 probably around 95 mmHg all the time. Added Diamox yesterday in hopes to drive her serum bicarb down and stimulate faster respiratory drive to ventilate her PCO2 down.  Acute on chronic respiratory failure with hypoxia and hypercapnia (HCC) 11-25-2023  continue with IV solumedrol for COPD exacerbation. Continue with Bipap. Add Diamox after checking VBG to assess her pH.  11-26-2023 baseline PCO2 probably around 95 mmHg all the time. Added Diamox yesterday in hopes to drive her serum bicarb down and stimulate faster respiratory drive to ventilate her PCO2 down.  Venous Blood Gas: Recent Labs    11/24/23 1735 11/25/23 1038 11/26/23 0337  PHVEN  --   7.42 7.32  PCO2VEN  --  96* 108*  PO2VEN  --  38 <31*  HCO3 57.6* 62.3* 56.2*  O2SAT 98.8 73.1 55.1     Tobacco abuse 11-25-2023 pt advised to stop smoking.  History of CVA (cerebrovascular accident) 11-25-2023 stable.  11-26-2023 stable  Obstructive sleep apnea treated with bilevel positive airway pressure (BiPAP) 11-25-2023 pt verifies that she had home Bipap machine.  11-26-2023 stable  PAF (paroxysmal atrial fibrillation) (HCC) 11-25-2023 continue with Eliquis , Toprol -XL 50 mg daily.  11-26-2023 stable  Hyperthyroidism 11-25-2023 continue with tapazole   11-26-2023 stable   DVT prophylaxis:  apixaban  (ELIQUIS ) tablet 5 mg     Code Status: Full Code Family Communication: no family at bedside. Pt is decisional. Disposition Plan: return home. Pt states she has multiple family members she lives with and has 24 hour assistance at home. Reason for continuing need for hospitalization: changing to po prednisone . Possible DC in AM.  Objective: Vitals:   11/26/23 0600 11/26/23 0820 11/26/23 0825 11/26/23 0922  BP: (!) 127/54   (!) 148/59  Pulse: 61 85  74  Resp: (!) 22     Temp:      TempSrc:      SpO2: 96% (!) 85% 98%   Weight:      Height:        Intake/Output Summary (Last 24 hours) at 11/26/2023 0926 Last data filed at 11/25/2023 1230 Gross per 24 hour  Intake 120 ml  Output --  Net 120 ml   Filed Weights   11/25/23 0010 11/25/23 0500 11/26/23 0500  Weight: 75.1 kg 74.1 kg 74.7 kg    Examination:  Physical Exam Vitals and nursing note reviewed.  Constitutional:      General: She is not in acute distress.    Appearance: She is not toxic-appearing.     Comments: Chronically ill appearing  HENT:     Head: Normocephalic and atraumatic.     Nose: Nose normal.  Cardiovascular:     Rate and Rhythm: Normal rate and regular rhythm.  Pulmonary:     Effort: Pulmonary effort is normal.     Breath sounds: Wheezing present.     Comments: Scattered rhonchi  bilaterally. Pt states she wheezes every day. Abdominal:     General: Bowel sounds are normal. There is no distension.     Palpations: Abdomen is soft.     Tenderness: There is no abdominal tenderness.  Musculoskeletal:     Right lower leg: No edema.     Left lower leg: No edema.  Skin:    General: Skin is warm and dry.     Capillary Refill: Capillary refill takes less than 2 seconds.  Neurological:     General: No focal deficit present.     Mental Status: She is alert and oriented to person, place, and time.     Data Reviewed: I have personally reviewed following labs and imaging studies  CBC: Recent Labs  Lab 11/24/23 1500 11/25/23 0339 11/26/23 0339  WBC 9.1 6.6 10.0  NEUTROABS 7.1  --  9.2*  HGB  15.4* 14.4 14.6  HCT 49.6* 47.5* 49.0*  MCV 93.1 93.7 96.1  PLT 245 210 243   Basic Metabolic Panel: Recent Labs  Lab 11/24/23 1500 11/25/23 0339 11/26/23 0339  NA 139 136 140  K 4.4 3.8 3.6  CL 84* 80* 85*  CO2 41* 41* 44*  GLUCOSE 119* 158* 136*  BUN 16 23 28*  CREATININE 0.55 0.66 0.48  CALCIUM  8.8* 8.8* 9.5   GFR: Estimated Creatinine Clearance: 69.7 mL/min (by C-G formula based on SCr of 0.48 mg/dL). Liver Function Tests: Recent Labs  Lab 11/24/23 1500  AST 16  ALT 9  ALKPHOS 63  BILITOT 1.6*  PROT 6.9  ALBUMIN 3.8   BNP (last 3 results) Recent Labs    08/21/23 0317 08/30/23 1415  BNP 37.7 64.7   Radiology Studies: DG Chest 2 View Result Date: 11/24/2023 CLINICAL DATA:  SOB EXAM: CHEST - 2 VIEW COMPARISON:  None available. FINDINGS: No focal airspace consolidation, pleural effusion, or pneumothorax. No cardiomegaly. No acute fracture or destructive lesion. IMPRESSION: No acute cardiopulmonary abnormality. Electronically Signed   By: Rance Burrows M.D.   On: 11/24/2023 16:45    Scheduled Meds:  acetaZOLAMIDE  500 mg Oral BID   apixaban   5 mg Oral BID   arformoterol  15 mcg Nebulization BID   budesonide (PULMICORT) nebulizer solution  0.25  mg Nebulization BID   Chlorhexidine  Gluconate Cloth  6 each Topical Daily   methimazole   20 mg Oral Daily   metoprolol  succinate  50 mg Oral Daily   predniSONE   40 mg Oral Q breakfast   sodium chloride  flush  3 mL Intravenous Q12H   Continuous Infusions:   LOS: 2 days   Time spent: 50 minutes  Unk Garb, DO  [facility]  11/26/2023, 9:26 AM

## 2023-11-26 NOTE — Evaluation (Signed)
 Physical Therapy Evaluation Patient Details Name: Yvette Booth MRN: [id number] DOB: 07-10-1960 Today's Date: 11/26/2023  History of Present Illness  Patient is a 63 yo female who presented on 11/24/23 with shortness of breat and admitted for COPD exacerbation.  Has been admitted 4 x since February for similar. At baseline, pt on 3 L O2.  Other hx includes but not limited to COPD, resp failure, RV failure, OSA, PAF, CVA, hyperthyroidism, HTN, tobacco use.  Clinical Impression  Pt admitted with above diagnosis. At baseline, pt reports able to ambulate in community with RW and perform ADLs.  She was on 2-3 L O2 at home.  She lives with daughter but reports she is in and out.  Pt unable to state if she has had falls. Today, she is slow to respond to questions and demonstrates decreased safety and awareness.  She ambulated 60'x2 with RW but required constant min A and mod A at times for LOB.  She was on 4 L O2 with sats dropping to 87% with ambulation. Pt is high fall risk and below reported baseline.    Pt currently with functional limitations due to the deficits listed below (see PT Problem List). Pt will benefit from acute skilled PT to increase their independence and safety with mobility to allow discharge.  Patient will benefit from continued inpatient follow up therapy, <3 hours/day at d/c.           If plan is discharge home, recommend the following: A little help with walking and/or transfers;A little help with bathing/dressing/bathroom;Assistance with cooking/housework;Help with stairs or ramp for entrance   Can travel by private vehicle   Yes    Equipment Recommendations None recommended by PT  Recommendations for Other Services       Functional Status Assessment Patient has had a recent decline in their functional status and demonstrates the ability to make significant improvements in function in a reasonable and predictable amount of time.     Precautions / Restrictions  Precautions Precautions: Fall Precaution/Restrictions Comments: monitor sats      Mobility  Bed Mobility Overal bed mobility: Needs Assistance Bed Mobility: Sit to Supine       Sit to supine: Supervision        Transfers Overall transfer level: Needs assistance Equipment used: Rolling walker (2 wheels) Transfers: Sit to/from Stand, Bed to chair/wheelchair/BSC Sit to Stand: Min assist   Step pivot transfers: Min assist       General transfer comment: Min A to steady; performed x 2    Ambulation/Gait Ambulation/Gait assistance: Min assist, Mod assist Gait Distance (Feet): 60 Feet (60'x2) Assistive device: Rolling walker (2 wheels) Gait Pattern/deviations: Step-through pattern, Decreased stride length, Drifts right/left, Trunk flexed Gait velocity: decr     General Gait Details: Pt ambulating 60'x2 with standing rest break.  Overall, needing min A for balance and RW managment but 2 LOB requiring mod A.  Frequent cues to stay close to RW.  Veers R (noted did this last admission per note)  Stairs            Wheelchair Mobility     Tilt Bed    Modified Rankin (Stroke Patients Only)       Balance Overall balance assessment: Needs assistance Sitting-balance support: No upper extremity supported Sitting balance-Leahy Scale: Good     Standing balance support: Bilateral upper extremity supported Standing balance-Leahy Scale: Poor  Pertinent Vitals/Pain Pain Assessment Pain Assessment: No/denies pain    Home Living Family/patient expects to be discharged to:: Private residence Living Arrangements: Children Available Help at Discharge: Family;Available PRN/intermittently Type of Home: House Home Access: Level entry       Home Layout: One level Home Equipment: Rolling Walker (2 wheels);BSC/3in1;Grab bars - tub/shower;Shower seat Additional Comments: on 2-3 L O2    Prior Function Prior Level of Function :  Independent/Modified Independent;Driving;Patient poor historian/Family not available             Mobility Comments: Pt able to ambulate with RW; Could ambulate in community ADLs Comments: Pt independent with ADLs; Daughter assist with IADLs     Extremity/Trunk Assessment   Upper Extremity Assessment Upper Extremity Assessment: Overall WFL for tasks assessed    Lower Extremity Assessment Lower Extremity Assessment: Overall WFL for tasks assessed    Cervical / Trunk Assessment Cervical / Trunk Assessment: Normal  Communication        Cognition Arousal: Alert Behavior During Therapy: WFL for tasks assessed/performed   PT - Cognitive impairments: Problem solving, Safety/Judgement, Awareness, Orientation   Orientation impairments: Time, Situation                   PT - Cognition Comments: Pt slow to respond to questions on PLOF , date, and even birthdate.  Pt sitting in urine, chair saturated and not aware and had not called out         Cueing       General Comments General comments (skin integrity, edema, etc.): Pt on 4 L O2 with sats 96% rest , dropped to 87% ambulation recovering in 30 sec-1 min.    Exercises     Assessment/Plan    PT Assessment Patient needs continued PT services  PT Problem List Cardiopulmonary status limiting activity;Decreased strength;Decreased range of motion;Decreased activity tolerance;Decreased knowledge of use of DME;Decreased balance;Decreased mobility       PT Treatment Interventions DME instruction;Therapeutic activities;Gait training;Functional mobility training;Patient/family education;Therapeutic exercise;Balance training;Stair training    PT Goals (Current goals can be found in the Care Plan section)  Acute Rehab PT Goals Patient Stated Goal: go home PT Goal Formulation: With patient Time For Goal Achievement: 12/10/23 Potential to Achieve Goals: Good    Frequency Min 2X/week     Co-evaluation                AM-PAC PT "6 Clicks" Mobility  Outcome Measure Help needed turning from your back to your side while in a flat bed without using bedrails?: A Little Help needed moving from lying on your back to sitting on the side of a flat bed without using bedrails?: A Little Help needed moving to and from a bed to a chair (including a wheelchair)?: A Little Help needed standing up from a chair using your arms (e.g., wheelchair or bedside chair)?: A Lot (mod cues) Help needed to walk in hospital room?: A Lot Help needed climbing 3-5 steps with a railing? : A Lot 6 Click Score: 15    End of Session Equipment Utilized During Treatment: Gait belt;Oxygen Activity Tolerance: Patient tolerated treatment well Patient left: in bed;with call bell/phone within reach;with bed alarm set Nurse Communication: Mobility status PT Visit Diagnosis: Difficulty in walking, not elsewhere classified (R26.2);Unsteadiness on feet (R26.81)    Time: 9562-1308 PT Time Calculation (min) (ACUTE ONLY): 23 min   Charges:   PT Evaluation $PT Eval Low Complexity: 1 Low PT Treatments $Gait Training: 8-22 mins PT  General Charges $$ ACUTE PT VISIT: 1 Visit         Cyd Dowse, PT Acute Rehab Services [facility] [phone]   Carolynn Citrin 11/26/2023, 4:00 PM

## 2023-11-27 DIAGNOSIS — J441 Chronic obstructive pulmonary disease with (acute) exacerbation: Secondary | ICD-10-CM | POA: Diagnosis not present

## 2023-11-27 DIAGNOSIS — J9611 Chronic respiratory failure with hypoxia: Secondary | ICD-10-CM | POA: Diagnosis not present

## 2023-11-27 DIAGNOSIS — Z8673 Personal history of transient ischemic attack (TIA), and cerebral infarction without residual deficits: Secondary | ICD-10-CM

## 2023-11-27 DIAGNOSIS — J9621 Acute and chronic respiratory failure with hypoxia: Secondary | ICD-10-CM | POA: Diagnosis not present

## 2023-11-27 DIAGNOSIS — Z720 Tobacco use: Secondary | ICD-10-CM

## 2023-11-27 DIAGNOSIS — I480 Paroxysmal atrial fibrillation: Secondary | ICD-10-CM | POA: Diagnosis not present

## 2023-11-27 LAB — BASIC METABOLIC PANEL WITH GFR
Anion gap: 9 (ref 5–15)
BUN: 27 mg/dL — ABNORMAL HIGH (ref 8–23)
CO2: 42 mmol/L — ABNORMAL HIGH (ref 22–32)
Calcium: 9.7 mg/dL (ref 8.9–10.3)
Chloride: 92 mmol/L — ABNORMAL LOW (ref 98–111)
Creatinine, Ser: 0.77 mg/dL (ref 0.44–1.00)
GFR, Estimated: >60 mL/min (ref >60)
Glucose, Bld: 102 mg/dL — ABNORMAL HIGH (ref 70–99)
Potassium: 3.2 mmol/L — ABNORMAL LOW (ref 3.5–5.1)
Sodium: 143 mmol/L (ref 135–145)

## 2023-11-27 LAB — BLOOD GAS, VENOUS
Acid-Base Excess: 20.7 mmol/L — ABNORMAL HIGH (ref 0.0–2.0)
Bicarbonate: 52.6 mmol/L — ABNORMAL HIGH (ref 20.0–28.0)
Drawn by: 6056
O2 Saturation: 82.5 %
Patient temperature: 36.7
pCO2, Ven: 101 mmHg (ref 44–60)
pH, Ven: 7.32 (ref 7.25–7.43)
pO2, Ven: 47 mmHg — ABNORMAL HIGH (ref 32–45)

## 2023-11-27 MED ORDER — POTASSIUM CHLORIDE 20 MEQ PO PACK: 40.0000 meq | PACK | Freq: Once | ORAL | Status: DC

## 2023-11-27 MED ORDER — POTASSIUM CHLORIDE CRYS ER 20 MEQ PO TBCR: 40.0000 meq | EXTENDED_RELEASE_TABLET | Freq: Once | ORAL | Status: DC

## 2023-11-27 NOTE — Plan of Care (Signed)
   Problem: Health Behavior/Discharge Planning: Goal: Ability to manage health-related needs will improve Outcome: Progressing

## 2023-11-27 NOTE — TOC Transition Note (Addendum)
 Transition of Care [facility]) - Discharge Note   Patient Details  Name: Yvette Booth MRN: [id number] Date of Birth: 1961-03-10  [facility] CM/SW Contact:  Levie Ream, RN Phone Number: 11/27/2023, 11:25 AM   Clinical Narrative:    [facility] consulted for d/c planning; PT recc SNF; per Dr Shearon Denis note pt declined SNF, and orders placed for HHPT; spoke w/ pt in room; pt verified she did not want SNF; pt also declined receiving HHPT ; pt notified she can set this up w/ her PCP if she desired; she also confirmed her dtr will bring full travel tank; no TOC needs.   -1315- notified by Cleave Curling, RN that pt's family did not bring travel tank for transport home; spoke w/ Maurie Southern, Rep [employer]; she will call back w/ status of request.  -1408- return call from [place], driver for [employer]; he says travel tank will be delivered to room w/ ETA of 1.5 hours; pt's Leigh, RN notified.  Final next level of care: Home/Self Care Barriers to Discharge: No Barriers Identified   Patient Goals and CMS Choice Patient states their goals for this hospitalization and ongoing recovery are:: home CMS Medicare.gov Compare Post Acute Care list provided to:: Patient   [place] ownership interest in [facility].provided to:: Patient    Discharge Placement                       Discharge Plan and Services Additional resources added to the After Visit Summary for     Discharge Planning Services: CM Consult                                 Social Drivers of Health (SDOH) Interventions SDOH Screenings   Food Insecurity: No Food Insecurity (11/25/2023)  Housing: Low Risk  (11/25/2023)  Transportation Needs: No Transportation Needs (11/25/2023)  Utilities: Not At Risk (11/25/2023)  Financial Resource Strain: Low Risk  (12/16/2022)   [facility] from [facility]  Physical Activity: Not on File (10/23/2021)   Received from [facility]  Social Connections: Moderately Isolated (11/25/2023)   Stress: Not on File (10/23/2021)   Received from [facility]  Tobacco Use: High Risk (11/25/2023)     Readmission Risk Interventions    11/25/2023   11:55 AM 08/22/2023   10:27 AM  Readmission Risk Prevention Plan  Post Dischage Appt  Complete  Medication Screening  Complete  Transportation Screening Complete Complete  PCP or Specialist Appt within 5-7 Days Complete   Home Care Screening Complete   Medication Review (RN CM) Complete

## 2023-11-27 NOTE — Progress Notes (Signed)
 PROGRESS NOTE    Yvette Booth  XBJ:[id number] DOB: 11-02-1960 DOA: 11/24/2023 PCP: [facility]  Subjective: Pt seen and examined.  Tolerated po prednisone . Wore bipap. Tolerating diamox. Pt still requesting to go home. She is AxOx4.  Has home O2, portable O2, home bipap, home neb machine.   Hospital Course: HPI: Yvette Booth is a 63 y.o. 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   Patient has been admitted 4 times since mid February for management of COPD exacerbations.  Most recently admitted 5/11-5/16 for the same requiring BiPAP on presentation.  She was treated with steroids, bronchodilators, antibiotics and ultimately weaned down to baseline 3L O2 via [place] prior to discharge.   Patient returns today after calling EMS for shortness of breath.  She was given DuoNeb and Solu-Medrol  en route to the ED.  She was somnolent on arrival and placed on BiPAP with improvement in respiratory status.  Patient awakens easily to voice and is following all commands.   ED Course  Labs/Imaging on admission: I have personally reviewed following labs and imaging studies.   Initial vitals showed BP 139/69, pulse 86, RR 18, temp 97.8 F, SpO2 98% on 3L O2 via [place].   Labs showed sodium 139, potassium 4.4, bicarb 41, BUN 16, creatinine 0.55, serum glucose 119, WBC 9.1, hemoglobin 15.4, platelets 245, troponin 4.   ABG pH 7.36, PCO2101, PO2 100 while on BiPAP 50% FiO2.   2 view chest x-ray negative for focal consolidation, edema, effusion.   Patient was given DuoNeb and albuterol  nebulizer treatments.  She was placed on BiPAP and the hospitalist service was consulted to admit.  Significant Events: Admitted 11/24/2023 for acute on chronic  respiratory failure with hypercabia/hypoxia   Admission Labs: WBC 9.1, HgB 15.4, plt 245 Na 139, K 4.4, CO2 of 41, BUN 16, Scr 0.55, glu 119 ABG pH 7.36, PCO2 of 101  Admission Imaging Studies: CXR No acute cardiopulmonary abnormality   Significant Labs:   Significant Imaging Studies:   Antibiotic Therapy: Anti-infectives (From admission, onward)    None       Procedures:   Consultants:     Assessment and Plan: * COPD with acute exacerbation (HCC) 11-25-2023 continue with IV solumedrol. Consider changing to po prednisone  tomorrow. Continue with nebs.  11-26-2023 change to prednisone . Pt verifies she has all equipment at home including O2 and Bipap machine. Will consult PT/OT to make sure she can still walk.  11-27-2023 pt ambulating. DC to home with 12 days prednisone  taper.  Chronic respiratory failure with hypoxia and hypercapnia (HCC) - on baseline 3 L/min, baseline PCO2 about 95 mmHg 11-25-2023 chronically on 3 L/min and bipap at home.  Baseline PCO2 is probably close to 95 mmHg on a regular basis.  11-26-2023 baseline PCO2 probably around 95 mmHg all the time. Added Diamox yesterday in hopes to drive her serum bicarb down and stimulate faster respiratory drive to ventilate her PCO2 down.  11-27-2023 pt tolerating po diamox. Needs to see her PCP in office in 1 week to get BMP check to monitor serum bicarb level. Hopefully by adding PO diamox, we can drive her metabolic alkalosis down some to stimulate her breathing more than therefore drive down her baseline PCO2.   Acute on chronic  respiratory failure with hypoxia and hypercapnia (HCC) 11-25-2023 continue with IV solumedrol for COPD exacerbation. Continue with Bipap. Add Diamox after checking VBG to assess her pH.  11-26-2023 baseline PCO2 probably around 95 mmHg all the time. Added Diamox yesterday in hopes to drive her serum bicarb down and stimulate faster respiratory drive to ventilate her PCO2  down.  Venous Blood Gas: Recent Labs    11/24/23 1735 11/25/23 1038 11/26/23 0337  PHVEN  --  7.42 7.32  PCO2VEN  --  96* 108*  PO2VEN  --  38 <31*  HCO3 57.6* 62.3* 56.2*  O2SAT 98.8 73.1 55.1   11-27-2023 pt tolerating po diamox. Needs to see her PCP in office in 1 week to get BMP check to monitor serum bicarb level.  Hopefully by adding PO diamox, we can drive her metabolic alkalosis down some to stimulate her breathing more than therefore drive down her baseline PCO2.   Tobacco abuse 11-25-2023 pt advised to stop smoking.  History of CVA (cerebrovascular accident) 11-25-2023 stable.  11-26-2023 stable  11-27-2023 stable  Obstructive sleep apnea treated with bilevel positive airway pressure (BiPAP) 11-25-2023 pt verifies that she had home Bipap machine.  11-26-2023 stable  11-27-2023 stable  PAF (paroxysmal atrial fibrillation) (HCC) 11-25-2023 continue with Eliquis , Toprol -XL 50 mg daily.  11-26-2023 stable  11-27-2023 stable  Hyperthyroidism 11-25-2023 continue with tapazole   11-26-2023 stable  11-27-2023 stable   DVT prophylaxis:  apixaban  (ELIQUIS ) tablet 5 mg     Code Status: Full Code Family Communication: pt is decisional. No family at bedside Disposition Plan: return home Reason for continuing need for hospitalization: stable for DC.  Objective: Vitals:   11/27/23 0500 11/27/23 0602 11/27/23 0803 11/27/23 0807  BP:  116/63  (!) 118/55  Pulse:  (!) 54 60 60  Resp:  20  16  Temp:  98.2 F (36.8 C)  98.1 F (36.7 C)  TempSrc:  Oral  Axillary  SpO2:  100%  100%  Weight: 76 kg     Height:        Intake/Output Summary (Last 24 hours) at 11/27/2023 1020 Last data filed at 11/27/2023 0800 Gross per 24 hour  Intake 720 ml  Output --  Net 720 ml   Filed Weights   11/25/23 0500 11/26/23 0500 11/27/23 0500  Weight: 74.1 kg 74.7 kg 76 kg    Examination:  Physical Exam Vitals and nursing note reviewed.  Constitutional:      General:  She is not in acute distress.    Appearance: She is not toxic-appearing.     Comments: Pt is annoyed she is still in the hospital.  HENT:     Head: Normocephalic and atraumatic.  Cardiovascular:     Rate and Rhythm: Normal rate and regular rhythm.  Pulmonary:     Effort: No respiratory distress.     Comments: Scattered rhonchi. Pt states she wheezes every day. Abdominal:     General: Abdomen is flat. Bowel sounds are normal.     Palpations: Abdomen is soft.  Skin:    General: Skin is warm and dry.     Capillary Refill: Capillary refill takes less than 2 seconds.  Neurological:     General: No focal deficit present.     Mental Status: She is alert and oriented to person, place, and time.     Data Reviewed: I have personally reviewed following labs and imaging studies  CBC: Recent Labs  Lab 11/24/23 1500 11/25/23 0339 11/26/23 0339  WBC 9.1  6.6 10.0  NEUTROABS 7.1  --  9.2*  HGB 15.4* 14.4 14.6  HCT 49.6* 47.5* 49.0*  MCV 93.1 93.7 96.1  PLT 245 210 243   Basic Metabolic Panel: Recent Labs  Lab 11/24/23 1500 11/25/23 0339 11/26/23 0339 11/27/23 0836  NA 139 136 140 143  K 4.4 3.8 3.6 3.2*  CL 84* 80* 85* 92*  CO2 41* 41* 44* 42*  GLUCOSE 119* 158* 136* 102*  BUN 16 23 28* 27*  CREATININE 0.55 0.66 0.48 0.77  CALCIUM  8.8* 8.8* 9.5 9.7   GFR: Estimated Creatinine Clearance: 70.2 mL/min (by C-G formula based on SCr of 0.77 mg/dL). Liver Function Tests: Recent Labs  Lab 11/24/23 1500  AST 16  ALT 9  ALKPHOS 63  BILITOT 1.6*  PROT 6.9  ALBUMIN 3.8   BNP (last 3 results) Recent Labs    08/21/23 0317 08/30/23 1415  BNP 37.7 64.7   Scheduled Meds:  acetaZOLAMIDE  500 mg Oral BID   apixaban   5 mg Oral BID   arformoterol  15 mcg Nebulization BID   budesonide (PULMICORT) nebulizer solution  0.25 mg Nebulization BID   Chlorhexidine  Gluconate Cloth  6 each Topical Daily   methimazole   20 mg Oral Daily   metoprolol  succinate  50 mg Oral Daily    potassium chloride  40 mEq Oral Once   predniSONE   40 mg Oral Q breakfast   sodium chloride  flush  3 mL Intravenous Q12H   Continuous Infusions:   LOS: 3 days   Time spent: 50 minutes  Unk Garb, DO  [facility]  11/27/2023, 10:20 AM

## 2023-11-27 NOTE — Progress Notes (Signed)
 OT Cancellation Note  Patient Details Name: Yvette Booth MRN: [id number] DOB: 19-May-1961   Cancelled Treatment:    Reason Eval/Treat Not Completed: OT screened, no needs identified, will sign off Patient is in room with visitor reporting awaiting O2 tank to be able to leave. Patient declined OT at this time. Wynette Heckler, MS Acute Rehabilitation Department Office# [phone]  11/27/2023, 3:37 PM

## 2023-11-27 NOTE — Discharge Summary (Signed)
 [facility] Discharge Summary   Patient name: Yvette Booth  Admit date:     11/24/2023  Discharge date: 11/27/2023  Attending Physician: Kenny Peals [[id number]]  Discharge Physician: Unk Garb   PCP: [facility]  Admitted From: Home  Disposition:  Home  Recommendations for Outpatient Follow-up:  Follow up with PCP in 1-2 weeks Needs repeat BMP in 1 week to check serum bicarbonate level.  If possible, please obtain venous blood gas to monitor PCO2. Pt may need to be referred to hospital outpatient lab to have VBG checked.  Home Health:No Equipment/Devices: None  Pt already in possession of home O2, portable O2, home bipap machine, home nebulizer machine   Discharge Condition:Stable CODE STATUS:FULL Diet recommendation: Heart Healthy Fluid Restriction: None  Hospital Summary: HPI: Yvette Booth is a 63 y.o. 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   Patient has been admitted 4 times since mid February for management of COPD exacerbations.  Most recently admitted 5/11-5/16 for the same requiring BiPAP on presentation.  She was treated with steroids, bronchodilators, antibiotics and ultimately weaned down to baseline 3L O2 via [place] prior to discharge.   Patient returns today after calling EMS for shortness of breath.  She was given DuoNeb and Solu-Medrol  en route to the ED.  She was somnolent on arrival and placed on BiPAP with improvement in respiratory status.  Patient awakens easily to voice and is following all commands.   ED Course  Labs/Imaging on admission: I have personally reviewed following labs and imaging studies.   Initial vitals showed BP 139/69, pulse 86, RR 18, temp 97.8 F, SpO2 98% on  3L O2 via [place].   Labs showed sodium 139, potassium 4.4, bicarb 41, BUN 16, creatinine 0.55, serum glucose 119, WBC 9.1, hemoglobin 15.4, platelets 245, troponin 4.   ABG pH 7.36, PCO2101, PO2 100 while on BiPAP 50% FiO2.   2 view chest x-ray negative for focal consolidation, edema, effusion.   Patient was given DuoNeb and albuterol  nebulizer treatments.  She was placed on BiPAP and the hospitalist service was consulted to admit.  Significant Events: Admitted 11/24/2023 for acute on chronic respiratory failure with hypercabia/hypoxia   Admission Labs: WBC 9.1, HgB 15.4, plt 245 Na 139, K 4.4, CO2 of 41, BUN 16, Scr 0.55, glu 119 ABG pH 7.36, PCO2 of 101  Admission Imaging Studies: CXR No acute cardiopulmonary abnormality   Significant Labs:   Significant Imaging Studies:   Antibiotic Therapy: Anti-infectives (From admission, onward)    None       Procedures:   Consultants:    Hospital Course by Problem: * COPD with acute exacerbation (HCC) 11-25-2023 continue with IV solumedrol. Consider changing to po prednisone  tomorrow. Continue with nebs.  11-26-2023 change to prednisone . Pt verifies she has all equipment at home including O2 and Bipap machine. Will consult PT/OT to make sure she can still walk.  11-27-2023 pt ambulating. DC to home with 12 days prednisone  taper.  Chronic respiratory failure with hypoxia and hypercapnia (HCC) - on baseline 3 L/min, baseline PCO2 about 95 mmHg 11-25-2023 chronically on 3 L/min and bipap at home.  Baseline PCO2 is probably close to 95 mmHg on a regular basis.  11-26-2023 baseline PCO2 probably around 95  mmHg all the time. Added Diamox  yesterday in hopes to drive her serum bicarb down and stimulate faster respiratory drive to ventilate her PCO2 down.  11-27-2023 pt tolerating po diamox . Needs to see her PCP in office in 1 week to get BMP check to monitor serum bicarb level. Hopefully by adding PO diamox , we can drive her  metabolic alkalosis down some to stimulate her breathing more than therefore drive down her baseline PCO2.   Acute on chronic respiratory failure with hypoxia and hypercapnia (HCC) 11-25-2023 continue with IV solumedrol for COPD exacerbation. Continue with Bipap. Add Diamox  after checking VBG to assess her pH.  11-26-2023 baseline PCO2 probably around 95 mmHg all the time. Added Diamox  yesterday in hopes to drive her serum bicarb down and stimulate faster respiratory drive to ventilate her PCO2 down.  Venous Blood Gas: Recent Labs    11/24/23 1735 11/25/23 1038 11/26/23 0337  PHVEN  --  7.42 7.32  PCO2VEN  --  96* 108*  PO2VEN  --  38 <31*  HCO3 57.6* 62.3* 56.2*  O2SAT 98.8 73.1 55.1   11-27-2023 pt tolerating po diamox . Needs to see her PCP in office in 1 week to get BMP check to monitor serum bicarb level.  Hopefully by adding PO diamox , we can drive her metabolic alkalosis down some to stimulate her breathing more than therefore drive down her baseline PCO2.   Tobacco abuse 11-25-2023 pt advised to stop smoking.  History of CVA (cerebrovascular accident) 11-25-2023 stable.  11-26-2023 stable  11-27-2023 stable  Obstructive sleep apnea treated with bilevel positive airway pressure (BiPAP) 11-25-2023 pt verifies that she had home Bipap machine.  11-26-2023 stable  11-27-2023 stable  PAF (paroxysmal atrial fibrillation) (HCC) 11-25-2023 continue with Eliquis , Toprol -XL 50 mg daily.  11-26-2023 stable  11-27-2023 stable  Hyperthyroidism 11-25-2023 continue with tapazole   11-26-2023 stable  11-27-2023 stable    Discharge Diagnoses:  Principal Problem:   COPD with acute exacerbation [facility]) Active Problems:   Acute on chronic respiratory failure with hypoxia and hypercapnia (HCC)   Chronic respiratory failure with hypoxia and hypercapnia (HCC) - on baseline 3 L/min, baseline PCO2 about 95 mmHg   Hyperthyroidism   PAF (paroxysmal atrial fibrillation) (HCC)    Obstructive sleep apnea treated with bilevel positive airway pressure (BiPAP)   History of CVA (cerebrovascular accident)   Tobacco abuse   Discharge Instructions  Discharge Instructions     Call MD for:  difficulty breathing, headache or visual disturbances   Complete by: As directed    Call MD for:  extreme fatigue   Complete by: As directed    Call MD for:  hives   Complete by: As directed    Call MD for:  persistant dizziness or light-headedness   Complete by: As directed    Call MD for:  persistant nausea and vomiting   Complete by: As directed    Call MD for:  redness, tenderness, or signs of infection (pain, swelling, redness, odor or green/yellow discharge around incision site)   Complete by: As directed    Call MD for:  severe uncontrolled pain   Complete by: As directed    Call MD for:  temperature >100.4   Complete by: As directed    Diet - low sodium heart healthy   Complete by: As directed    Discharge instructions   Complete by: As directed    1. Follow up with your primary care provider in 1-2 weeks following discharge from hospital. 2. Call for  appointment to see your pulmonology with [facility].   Increase activity slowly   Complete by: As directed    No wound care   Complete by: As directed       Allergies as of 11/27/2023       Reactions   Seroquel [quetiapine] Other (See Comments)   Delirium and confusion         Medication List     TAKE these medications    acetaZOLAMIDE 250 MG tablet Commonly known as: DIAMOX Take 1 tablet (250 mg total) by mouth 2 (two) times daily for 14 days.   albuterol  108 (90 Base) MCG/ACT inhaler Commonly known as: VENTOLIN  HFA Inhale 2 puffs into the lungs every 6 (six) hours as needed for wheezing or shortness of breath.   Eliquis  5 MG Tabs tablet Generic drug: apixaban  Take 5 mg by mouth in the morning and at bedtime.   fluticasone -salmeterol 250-50 MCG/ACT Aepb Commonly known as: ADVAIR Inhale 1 puff  into the lungs in the morning and at bedtime.   furosemide  40 MG tablet Commonly known as: LASIX  Take 40 mg by mouth in the morning.   Incruse Ellipta  62.5 MCG/ACT Aepb Generic drug: umeclidinium bromide  Inhale 1 puff into the lungs daily.   ipratropium-albuterol  0.5-2.5 (3) MG/3ML Soln Commonly known as: DUONEB Take 3 mLs by nebulization in the morning, at noon, in the evening, and at bedtime for 14 days, THEN 3 mLs every 6 (six) hours as needed for up to 28 days. Start taking on: Nov 26, 2023   methimazole  10 MG tablet Commonly known as: TAPAZOLE  Take 20 mg by mouth in the morning.   metoprolol  succinate 50 MG 24 hr tablet Commonly known as: TOPROL -XL Take 50 mg by mouth in the morning. Take with or immediately following a meal.   NICODERM CQ  TD Place 1 patch onto the skin daily as needed (for smoking cessation).   OXYGEN Inhale 2 L/min into the lungs continuous.   predniSONE  20 MG tablet Commonly known as: DELTASONE  Take 2 tablets (40 mg total) by mouth daily with breakfast for 3 days, THEN 1.5 tablets (30 mg total) daily with breakfast for 3 days, THEN 1 tablet (20 mg total) daily with breakfast for 3 days, THEN 0.5 tablets (10 mg total) daily with breakfast for 3 days. Start taking on: Nov 27, 2023        Follow-up Information     [facility] Follow up.   Contact information: [address] [address] [phone]         [facility]. Schedule an appointment as soon as possible for a visit in 1 week(s).   Specialty: Pediatrics Contact information: [address] [place] [address] [phone]         Marine Sia, MD. Schedule an appointment as soon as possible for a visit in 1 week(s).   Specialty: Pulmonary Disease Contact information: [address] [place] [address] [phone]                Allergies  Allergen Reactions   Seroquel [Quetiapine]  Other (See Comments)    Delirium and confusion     Discharge Exam: Vitals:   11/27/23 0803 11/27/23 0807  BP:  (!) 118/55  Pulse: 60 60  Resp:  16  Temp:  98.1 F (36.7 C)  SpO2:  100%    Physical Exam Vitals and nursing note reviewed.  Constitutional:      General: She is not in acute distress.    Appearance: She is not toxic-appearing.     Comments: Pt is annoyed she is still in the hospital.  HENT:     Head: Normocephalic and atraumatic.  Cardiovascular:     Rate and Rhythm: Normal rate and regular rhythm.  Pulmonary:     Effort: No respiratory distress.     Comments: Scattered rhonchi. Pt states she wheezes every day. Abdominal:     General: Abdomen is flat. Bowel sounds are normal.     Palpations: Abdomen is soft.  Skin:    General: Skin is warm and dry.     Capillary Refill: Capillary refill takes less than 2 seconds.  Neurological:     General: No focal deficit present.     Mental Status: She is alert and oriented to person, place, and time.     The results of significant diagnostics from this hospitalization (including imaging, microbiology, ancillary and laboratory) are listed below for reference.     Labs: BNP (last 3 results) Recent Labs    08/21/23 0317 08/30/23 1415  BNP 37.7 64.7   Basic Metabolic Panel: Recent Labs  Lab 11/24/23 1500 11/25/23 0339 11/26/23 0339 11/27/23 0836  NA 139 136 140 143  K 4.4 3.8 3.6 3.2*  CL 84* 80* 85* 92*  CO2 41* 41* 44* 42*  GLUCOSE 119* 158* 136* 102*  BUN 16 23 28* 27*  CREATININE 0.55 0.66 0.48 0.77  CALCIUM  8.8* 8.8* 9.5 9.7   Liver Function Tests: Recent Labs  Lab 11/24/23 1500  AST 16  ALT 9  ALKPHOS 63  BILITOT 1.6*  PROT 6.9  ALBUMIN 3.8   CBC: Recent Labs  Lab 11/24/23 1500 11/25/23 0339 11/26/23 0339  WBC 9.1 6.6 10.0  NEUTROABS 7.1  --  9.2*  HGB 15.4* 14.4 14.6  HCT 49.6* 47.5* 49.0*  MCV 93.1 93.7 96.1  PLT 245 210 243   Sepsis Labs Recent Labs  Lab 11/24/23 1500  11/25/23 0339 11/26/23 0339  WBC 9.1 6.6 10.0    Recent Labs    11/24/23 1735 11/25/23 1038 11/26/23 0337 11/27/23 0753  PHVEN  --  7.42 7.32 7.32  PCO2VEN  --  96* 108* 101*  PO2VEN  --  38 <31* 47*  HCO3 57.6* 62.3* 56.2* 52.6*  O2SAT 98.8 73.1 55.1 82.5     Procedures/Studies: DG Chest 2 View Result Date: 11/24/2023 CLINICAL DATA:  SOB EXAM: CHEST - 2 VIEW COMPARISON:  None available. FINDINGS: No focal airspace consolidation, pleural effusion, or pneumothorax. No cardiomegaly. No acute fracture or destructive lesion. IMPRESSION: No acute cardiopulmonary abnormality. Electronically Signed   By: Rance Burrows M.D.   On: 11/24/2023 16:45   DG Chest Port 1 View Result Date: 11/14/2023 CLINICAL DATA:  Initial evaluation for acute respiratory distress. EXAM: PORTABLE CHEST 1 VIEW COMPARISON:  Prior radiograph from 10/15/2023 FINDINGS: Transverse heart size stable, and remains within normal limits. Mediastinal silhouette within normal limits. Aortic atherosclerosis. Lungs are normally inflated. Attenuation of the pulmonary markings superiorly, consistent with emphysema. Chronic coarsening of the interstitial markings at the lung bases, stable. No other focal airspace disease. No pulmonary edema or pleural effusion. No pneumothorax. Prominence of the main pulmonary arteries noted bilaterally. Visualized soft tissues and osseous structures demonstrate no acute finding. IMPRESSION: 1. No radiographic evidence for active cardiopulmonary disease. 2.  Emphysema (ICD10-J43.9). 3. Prominence of the main pulmonary arteries bilaterally, which can be seen with pulmonary  arterial hypertension. Electronically Signed   By: Virgia Griffins M.D.   On: 11/14/2023 22:34    Time coordinating discharge: 55 mins  SIGNED:  Unk Garb, DO [facility] 11/27/23, 10:22 AM

## 2023-12-02 ENCOUNTER — Other Ambulatory Visit: Payer: Self-pay

## 2023-12-02 ENCOUNTER — Emergency Department (HOSPITAL_COMMUNITY)

## 2023-12-02 ENCOUNTER — Encounter (HOSPITAL_COMMUNITY): Payer: Self-pay | Admitting: Emergency Medicine

## 2023-12-02 ENCOUNTER — Inpatient Hospital Stay (HOSPITAL_COMMUNITY)
Admission: EM | Admit: 2023-12-02 | Discharge: 2024-01-04 | DRG: 208 | Disposition: E | Attending: Pulmonary Disease | Admitting: Pulmonary Disease

## 2023-12-02 DIAGNOSIS — J440 Chronic obstructive pulmonary disease with acute lower respiratory infection: Secondary | ICD-10-CM | POA: Diagnosis present

## 2023-12-02 DIAGNOSIS — Z9981 Dependence on supplemental oxygen: Secondary | ICD-10-CM | POA: Diagnosis not present

## 2023-12-02 DIAGNOSIS — G9341 Metabolic encephalopathy: Secondary | ICD-10-CM | POA: Diagnosis present

## 2023-12-02 DIAGNOSIS — J9601 Acute respiratory failure with hypoxia: Secondary | ICD-10-CM | POA: Diagnosis not present

## 2023-12-02 DIAGNOSIS — I480 Paroxysmal atrial fibrillation: Secondary | ICD-10-CM | POA: Diagnosis present

## 2023-12-02 DIAGNOSIS — F17210 Nicotine dependence, cigarettes, uncomplicated: Secondary | ICD-10-CM | POA: Diagnosis present

## 2023-12-02 DIAGNOSIS — Z515 Encounter for palliative care: Secondary | ICD-10-CM | POA: Diagnosis not present

## 2023-12-02 DIAGNOSIS — B9789 Other viral agents as the cause of diseases classified elsewhere: Secondary | ICD-10-CM | POA: Diagnosis present

## 2023-12-02 DIAGNOSIS — J9602 Acute respiratory failure with hypercapnia: Secondary | ICD-10-CM

## 2023-12-02 DIAGNOSIS — I50810 Right heart failure, unspecified: Secondary | ICD-10-CM | POA: Diagnosis present

## 2023-12-02 DIAGNOSIS — E0590 Thyrotoxicosis, unspecified without thyrotoxic crisis or storm: Secondary | ICD-10-CM | POA: Diagnosis present

## 2023-12-02 DIAGNOSIS — I110 Hypertensive heart disease with heart failure: Secondary | ICD-10-CM | POA: Diagnosis present

## 2023-12-02 DIAGNOSIS — Z7189 Other specified counseling: Secondary | ICD-10-CM | POA: Diagnosis not present

## 2023-12-02 DIAGNOSIS — J449 Chronic obstructive pulmonary disease, unspecified: Secondary | ICD-10-CM

## 2023-12-02 DIAGNOSIS — J1289 Other viral pneumonia: Principal | ICD-10-CM | POA: Diagnosis present

## 2023-12-02 DIAGNOSIS — J439 Emphysema, unspecified: Secondary | ICD-10-CM | POA: Diagnosis present

## 2023-12-02 DIAGNOSIS — Z7901 Long term (current) use of anticoagulants: Secondary | ICD-10-CM

## 2023-12-02 DIAGNOSIS — Z79899 Other long term (current) drug therapy: Secondary | ICD-10-CM

## 2023-12-02 DIAGNOSIS — Z6830 Body mass index (BMI) 30.0-30.9, adult: Secondary | ICD-10-CM | POA: Diagnosis not present

## 2023-12-02 DIAGNOSIS — J9621 Acute and chronic respiratory failure with hypoxia: Secondary | ICD-10-CM | POA: Diagnosis present

## 2023-12-02 DIAGNOSIS — E43 Unspecified severe protein-calorie malnutrition: Secondary | ICD-10-CM | POA: Diagnosis present

## 2023-12-02 DIAGNOSIS — Z7951 Long term (current) use of inhaled steroids: Secondary | ICD-10-CM

## 2023-12-02 DIAGNOSIS — D696 Thrombocytopenia, unspecified: Secondary | ICD-10-CM | POA: Diagnosis present

## 2023-12-02 DIAGNOSIS — J9622 Acute and chronic respiratory failure with hypercapnia: Secondary | ICD-10-CM | POA: Diagnosis present

## 2023-12-02 DIAGNOSIS — Z91199 Patient's noncompliance with other medical treatment and regimen due to unspecified reason: Secondary | ICD-10-CM

## 2023-12-02 DIAGNOSIS — Z888 Allergy status to other drugs, medicaments and biological substances status: Secondary | ICD-10-CM

## 2023-12-02 DIAGNOSIS — Z781 Physical restraint status: Secondary | ICD-10-CM | POA: Diagnosis not present

## 2023-12-02 DIAGNOSIS — J441 Chronic obstructive pulmonary disease with (acute) exacerbation: Principal | ICD-10-CM | POA: Diagnosis present

## 2023-12-02 DIAGNOSIS — F419 Anxiety disorder, unspecified: Secondary | ICD-10-CM | POA: Diagnosis present

## 2023-12-02 DIAGNOSIS — E870 Hyperosmolality and hypernatremia: Secondary | ICD-10-CM | POA: Diagnosis not present

## 2023-12-02 DIAGNOSIS — G4733 Obstructive sleep apnea (adult) (pediatric): Secondary | ICD-10-CM | POA: Diagnosis present

## 2023-12-02 DIAGNOSIS — Z86718 Personal history of other venous thrombosis and embolism: Secondary | ICD-10-CM

## 2023-12-02 DIAGNOSIS — D751 Secondary polycythemia: Secondary | ICD-10-CM | POA: Diagnosis present

## 2023-12-02 DIAGNOSIS — E871 Hypo-osmolality and hyponatremia: Secondary | ICD-10-CM | POA: Diagnosis not present

## 2023-12-02 DIAGNOSIS — Z66 Do not resuscitate: Secondary | ICD-10-CM | POA: Diagnosis not present

## 2023-12-02 DIAGNOSIS — J9620 Acute and chronic respiratory failure, unspecified whether with hypoxia or hypercapnia: Secondary | ICD-10-CM | POA: Diagnosis present

## 2023-12-02 DIAGNOSIS — Z720 Tobacco use: Secondary | ICD-10-CM | POA: Diagnosis not present

## 2023-12-02 DIAGNOSIS — Z8673 Personal history of transient ischemic attack (TIA), and cerebral infarction without residual deficits: Secondary | ICD-10-CM

## 2023-12-02 LAB — BASIC METABOLIC PANEL WITH GFR
Anion gap: 9 (ref 5–15)
BUN: 21 mg/dL (ref 8–23)
CO2: 37 mmol/L — ABNORMAL HIGH (ref 22–32)
Calcium: 9.1 mg/dL (ref 8.9–10.3)
Chloride: 94 mmol/L — ABNORMAL LOW (ref 98–111)
Creatinine, Ser: 0.62 mg/dL (ref 0.44–1.00)
GFR, Estimated: >60 mL/min (ref >60)
Glucose, Bld: 184 mg/dL — ABNORMAL HIGH (ref 70–99)
Potassium: 4 mmol/L (ref 3.5–5.1)
Sodium: 140 mmol/L (ref 135–145)

## 2023-12-02 LAB — I-STAT CHEM 8, ED
BUN: 26 mg/dL — ABNORMAL HIGH (ref 8–23)
Calcium, Ion: 1.1 mmol/L — ABNORMAL LOW (ref 1.15–1.40)
Chloride: 95 mmol/L — ABNORMAL LOW (ref 98–111)
Creatinine, Ser: 0.7 mg/dL (ref 0.44–1.00)
Glucose, Bld: 187 mg/dL — ABNORMAL HIGH (ref 70–99)
HCT: 50 % — ABNORMAL HIGH (ref 36.0–46.0)
Hemoglobin: 17 g/dL — ABNORMAL HIGH (ref 12.0–15.0)
Potassium: 4.1 mmol/L (ref 3.5–5.1)
Sodium: 139 mmol/L (ref 135–145)
TCO2: 38 mmol/L — ABNORMAL HIGH (ref 22–32)

## 2023-12-02 LAB — I-STAT VENOUS BLOOD GAS, ED
Acid-Base Excess: 11 mmol/L — ABNORMAL HIGH (ref 0.0–2.0)
Acid-base deficit: 4 mmol/L — ABNORMAL HIGH (ref 0.0–2.0)
Bicarbonate: 41.4 mmol/L — ABNORMAL HIGH (ref 20.0–28.0)
Bicarbonate: 22.1 mmol/L (ref 20.0–28.0)
Calcium, Ion: 1.1 mmol/L — ABNORMAL LOW (ref 1.15–1.40)
Calcium, Ion: 0.66 mmol/L — CL (ref 1.15–1.40)
HCT: 47 % — ABNORMAL HIGH (ref 36.0–46.0)
HCT: 27 % — ABNORMAL LOW (ref 36.0–46.0)
Hemoglobin: 16 g/dL — ABNORMAL HIGH (ref 12.0–15.0)
Hemoglobin: 9.2 g/dL — ABNORMAL LOW (ref 12.0–15.0)
O2 Saturation: 99 %
O2 Saturation: 96 %
Potassium: 4 mmol/L (ref 3.5–5.1)
Potassium: 3.6 mmol/L (ref 3.5–5.1)
Sodium: 138 mmol/L (ref 135–145)
Sodium: 145 mmol/L (ref 135–145)
TCO2: 44 mmol/L — ABNORMAL HIGH (ref 22–32)
TCO2: 23 mmol/L (ref 22–32)
pCO2, Ven: 82.9 mmHg (ref 44–60)
pCO2, Ven: 45.4 mmHg (ref 44–60)
pH, Ven: 7.307 (ref 7.25–7.43)
pH, Ven: 7.295 (ref 7.25–7.43)
pO2, Ven: 169 mmHg — ABNORMAL HIGH (ref 32–45)
pO2, Ven: 91 mmHg — ABNORMAL HIGH (ref 32–45)

## 2023-12-02 LAB — BLOOD GAS, ARTERIAL
Acid-Base Excess: 14.5 mmol/L — ABNORMAL HIGH (ref 0.0–2.0)
Bicarbonate: 48.9 mmol/L — ABNORMAL HIGH (ref 20.0–28.0)
O2 Saturation: 100 %
Patient temperature: 36.8
pCO2 arterial: >123 mmHg (ref 32–48)
pH, Arterial: 7.19 — CL (ref 7.35–7.45)
pO2, Arterial: 154 mmHg — ABNORMAL HIGH (ref 83–108)

## 2023-12-02 LAB — CBC WITH DIFFERENTIAL/PLATELET
Abs Immature Granulocytes: 0.25 10*3/uL — ABNORMAL HIGH (ref 0.00–0.07)
Basophils Absolute: 0.1 10*3/uL (ref 0.0–0.1)
Basophils Relative: 0 %
Eosinophils Absolute: 0 10*3/uL (ref 0.0–0.5)
Eosinophils Relative: 0 %
HCT: 51.6 % — ABNORMAL HIGH (ref 36.0–46.0)
Hemoglobin: 15.1 g/dL — ABNORMAL HIGH (ref 12.0–15.0)
Immature Granulocytes: 1 %
Lymphocytes Relative: 9 %
Lymphs Abs: 1.6 10*3/uL (ref 0.7–4.0)
MCH: 28.1 pg (ref 26.0–34.0)
MCHC: 29.3 g/dL — ABNORMAL LOW (ref 30.0–36.0)
MCV: 95.9 fL (ref 80.0–100.0)
Monocytes Absolute: 1.2 10*3/uL — ABNORMAL HIGH (ref 0.1–1.0)
Monocytes Relative: 7 %
Neutro Abs: 14.8 10*3/uL — ABNORMAL HIGH (ref 1.7–7.7)
Neutrophils Relative %: 83 %
Platelets: 268 10*3/uL (ref 150–400)
RBC: 5.38 MIL/uL — ABNORMAL HIGH (ref 3.87–5.11)
RDW: 14.1 % (ref 11.5–15.5)
WBC: 17.9 10*3/uL — ABNORMAL HIGH (ref 4.0–10.5)
nRBC: 0 % (ref 0.0–0.2)

## 2023-12-02 LAB — TROPONIN I (HIGH SENSITIVITY)
Troponin I (High Sensitivity): 7 ng/L (ref <18)
Troponin I (High Sensitivity): 5 ng/L (ref <18)

## 2023-12-02 LAB — MRSA NEXT GEN BY PCR, NASAL: MRSA by PCR Next Gen: DETECTED — AB

## 2023-12-02 LAB — GLUCOSE, CAPILLARY
Glucose-Capillary: 150 mg/dL — ABNORMAL HIGH (ref 70–99)
Glucose-Capillary: 193 mg/dL — ABNORMAL HIGH (ref 70–99)

## 2023-12-02 MED ORDER — ALBUTEROL SULFATE (2.5 MG/3ML) 0.083% IN NEBU
10.0000 mg/h | INHALATION_SOLUTION | Freq: Once | RESPIRATORY_TRACT | Status: AC
  Administered 2023-12-02: 10 mg/h via RESPIRATORY_TRACT
  Filled 2023-12-02: qty 3

## 2023-12-02 MED ORDER — SODIUM CHLORIDE 0.9 % IV SOLN: INTRAVENOUS | Status: AC | PRN

## 2023-12-02 MED ORDER — NOREPINEPHRINE 4 MG/250ML-% IV SOLN
INTRAVENOUS | Status: AC
  Filled 2023-12-02: qty 250

## 2023-12-02 MED ORDER — METHYLPREDNISOLONE SODIUM SUCC 125 MG IJ SOLR: 125.0000 mg | Freq: Once | INTRAMUSCULAR | Status: AC

## 2023-12-02 MED ORDER — METHIMAZOLE 10 MG PO TABS
20.0000 mg | ORAL_TABLET | Freq: Every morning | ORAL | Status: DC
  Administered 2023-12-03: 20 mg via ORAL
  Filled 2023-12-02 (×2): qty 2

## 2023-12-02 MED ORDER — PANTOPRAZOLE SODIUM 40 MG IV SOLR
40.0000 mg | INTRAVENOUS | Status: DC
  Administered 2023-12-02 – 2023-12-09 (×8): 40 mg via INTRAVENOUS
  Filled 2023-12-02 (×8): qty 10

## 2023-12-02 MED ORDER — REVEFENACIN 175 MCG/3ML IN SOLN
175.0000 ug | Freq: Every day | RESPIRATORY_TRACT | Status: DC
  Administered 2023-12-03 – 2023-12-10 (×8): 175 ug via RESPIRATORY_TRACT
  Filled 2023-12-02 (×8): qty 3

## 2023-12-02 MED ORDER — ONDANSETRON HCL 4 MG/2ML IJ SOLN
INTRAMUSCULAR | Status: AC
  Administered 2023-12-02: 4 mg
  Filled 2023-12-02: qty 2

## 2023-12-02 MED ORDER — SODIUM CHLORIDE 0.9 % IV SOLN
500.0000 mg | INTRAVENOUS | Status: AC
  Administered 2023-12-02 – 2023-12-06 (×5): 500 mg via INTRAVENOUS
  Filled 2023-12-02 (×5): qty 5

## 2023-12-02 MED ORDER — UMECLIDINIUM BROMIDE 62.5 MCG/ACT IN AEPB: 1.0000 | INHALATION_SPRAY | Freq: Every day | RESPIRATORY_TRACT | Status: DC

## 2023-12-02 MED ORDER — IPRATROPIUM-ALBUTEROL 0.5-2.5 (3) MG/3ML IN SOLN: 3.0000 mL | Freq: Four times a day (QID) | RESPIRATORY_TRACT | Status: DC

## 2023-12-02 MED ORDER — ARFORMOTEROL TARTRATE 15 MCG/2ML IN NEBU
15.0000 ug | INHALATION_SOLUTION | Freq: Two times a day (BID) | RESPIRATORY_TRACT | Status: DC
  Administered 2023-12-02 – 2023-12-10 (×16): 15 ug via RESPIRATORY_TRACT
  Filled 2023-12-02 (×16): qty 2

## 2023-12-02 MED ORDER — INSULIN ASPART 100 UNIT/ML IJ SOLN
0.0000 [IU] | INTRAMUSCULAR | Status: DC
  Administered 2023-12-02: 3 [IU] via SUBCUTANEOUS
  Administered 2023-12-03 – 2023-12-05 (×9): 2 [IU] via SUBCUTANEOUS
  Administered 2023-12-06: 5 [IU] via SUBCUTANEOUS
  Administered 2023-12-06: 2 [IU] via SUBCUTANEOUS
  Administered 2023-12-06: 3 [IU] via SUBCUTANEOUS
  Administered 2023-12-06 (×2): 2 [IU] via SUBCUTANEOUS
  Administered 2023-12-07: 5 [IU] via SUBCUTANEOUS
  Administered 2023-12-07 (×4): 3 [IU] via SUBCUTANEOUS
  Administered 2023-12-07: 5 [IU] via SUBCUTANEOUS
  Administered 2023-12-08 (×2): 2 [IU] via SUBCUTANEOUS
  Administered 2023-12-08 (×2): 3 [IU] via SUBCUTANEOUS
  Administered 2023-12-08: 5 [IU] via SUBCUTANEOUS
  Administered 2023-12-09 (×2): 3 [IU] via SUBCUTANEOUS
  Administered 2023-12-09: 5 [IU] via SUBCUTANEOUS
  Administered 2023-12-09: 3 [IU] via SUBCUTANEOUS
  Administered 2023-12-09 (×2): 5 [IU] via SUBCUTANEOUS
  Administered 2023-12-10: 3 [IU] via SUBCUTANEOUS
  Administered 2023-12-10: 8 [IU] via SUBCUTANEOUS

## 2023-12-02 MED ORDER — CHLORHEXIDINE GLUCONATE CLOTH 2 % EX PADS
6.0000 | MEDICATED_PAD | Freq: Every day | CUTANEOUS | Status: DC
  Administered 2023-12-02 – 2023-12-10 (×9): 6 via TOPICAL

## 2023-12-02 MED ORDER — ALBUTEROL SULFATE (2.5 MG/3ML) 0.083% IN NEBU
2.5000 mg | INHALATION_SOLUTION | RESPIRATORY_TRACT | Status: DC | PRN
  Administered 2023-12-03 – 2023-12-10 (×4): 2.5 mg via RESPIRATORY_TRACT
  Filled 2023-12-02 (×4): qty 3

## 2023-12-02 MED ORDER — METHYLPREDNISOLONE SODIUM SUCC 40 MG IJ SOLR
40.0000 mg | Freq: Every day | INTRAMUSCULAR | Status: DC
  Administered 2023-12-02: 40 mg via INTRAVENOUS
  Filled 2023-12-02: qty 1

## 2023-12-02 MED ORDER — APIXABAN 5 MG PO TABS
5.0000 mg | ORAL_TABLET | Freq: Two times a day (BID) | ORAL | Status: DC
  Administered 2023-12-03: 5 mg via ORAL
  Filled 2023-12-02: qty 1

## 2023-12-02 MED ORDER — NICOTINE 14 MG/24HR TD PT24
14.0000 mg | MEDICATED_PATCH | Freq: Every day | TRANSDERMAL | Status: DC | PRN
  Administered 2023-12-04: 14 mg via TRANSDERMAL
  Filled 2023-12-02: qty 1

## 2023-12-02 MED ORDER — ALBUTEROL SULFATE (2.5 MG/3ML) 0.083% IN NEBU: 2.5000 mg | INHALATION_SOLUTION | RESPIRATORY_TRACT | Status: DC

## 2023-12-02 MED ORDER — METHYLPREDNISOLONE SODIUM SUCC 125 MG IJ SOLR
INTRAMUSCULAR | Status: AC
  Administered 2023-12-02: 125 mg via INTRAVENOUS
  Filled 2023-12-02: qty 2

## 2023-12-02 MED ORDER — METHYLPREDNISOLONE SODIUM SUCC 40 MG IJ SOLR: 40.0000 mg | Freq: Two times a day (BID) | INTRAMUSCULAR | Status: DC

## 2023-12-02 MED ORDER — METOPROLOL SUCCINATE ER 25 MG PO TB24
50.0000 mg | ORAL_TABLET | Freq: Every morning | ORAL | Status: DC
  Administered 2023-12-03: 50 mg via ORAL
  Filled 2023-12-02 (×2): qty 2

## 2023-12-02 MED ORDER — BUDESONIDE 0.5 MG/2ML IN SUSP
0.5000 mg | Freq: Two times a day (BID) | RESPIRATORY_TRACT | Status: DC
  Administered 2023-12-02 – 2023-12-10 (×16): 0.5 mg via RESPIRATORY_TRACT
  Filled 2023-12-02 (×16): qty 2

## 2023-12-02 NOTE — ED Provider Notes (Signed)
 [place] EMERGENCY DEPARTMENT AT [facility] Provider Note   CSN: [id number] Arrival date & time: 12/02/23  1106     History  Chief Complaint  Patient presents with   Respiratory Distress    Yvette Booth is a 63 y.o. female.  Patient with history of afib, COPD, chronic hypoxic respiratory failure on 2 L of oxygen via nasal cannula at baseline, hypertension, DVT presents today in respiratory distress.  Patient is still a daily smoker.  She is also on Eliquis .  Upon my evaluation, patient is fairly somnolent and is unable to contribute to history.  Reportedly called EMS for shortness of breath.  She was given a DuoNeb with EMS, was not given steroids due to inability to get IV access. Has had several previous admissions for similar, frequently requires bipap.  The history is provided by the patient and the EMS personnel. No language interpreter was used.       Home Medications Prior to Admission medications   Medication Sig Start Date End Date Taking? Authorizing Provider  acetaZOLAMIDE  (DIAMOX ) 250 MG tablet Take 1 tablet (250 mg total) by mouth 2 (two) times daily for 14 days. 11/26/23 12/19/2023  Unk Garb, DO  albuterol  (VENTOLIN  HFA) 108 [phone] Base) MCG/ACT inhaler Inhale 2 puffs into the lungs every 6 (six) hours as needed for wheezing or shortness of breath.    [provider]  apixaban  (ELIQUIS ) 5 MG TABS tablet Take 5 mg by mouth in the morning and at bedtime.    [provider]  fluticasone -salmeterol (ADVAIR) 250-50 MCG/ACT AEPB Inhale 1 puff into the lungs in the morning and at bedtime. 09/01/23   Ozell Blunt, MD  furosemide  (LASIX ) 40 MG tablet Take 40 mg by mouth in the morning.    [provider]  ipratropium-albuterol  (DUONEB) 0.5-2.5 (3) MG/3ML SOLN Take 3 mLs by nebulization in the morning, at noon, in the evening, and at bedtime for 14 days, THEN 3 mLs every 6 (six) hours as needed for up to 28 days. 11/26/23 01/07/24  Unk Garb, DO   methimazole  (TAPAZOLE ) 10 MG tablet Take 20 mg by mouth in the morning.    [provider]  metoprolol  succinate (TOPROL -XL) 50 MG 24 hr tablet Take 50 mg by mouth in the morning. Take with or immediately following a meal.    [provider]  Nicotine  (NICODERM CQ  TD) Place 1 patch onto the skin daily as needed (for smoking cessation).    [provider]  OXYGEN Inhale 2 L/min into the lungs continuous.    [provider]  predniSONE  (DELTASONE ) 20 MG tablet Take 2 tablets (40 mg total) by mouth daily with breakfast for 3 days, THEN 1.5 tablets (30 mg total) daily with breakfast for 3 days, THEN 1 tablet (20 mg total) daily with breakfast for 3 days, THEN 0.5 tablets (10 mg total) daily with breakfast for 3 days. 11/27/23 12/09/23  Unk Garb, DO  umeclidinium bromide  (INCRUSE ELLIPTA ) 62.5 MCG/ACT AEPB Inhale 1 puff into the lungs daily. 09/01/23   Ozell Blunt, MD      Allergies    Seroquel [quetiapine]    Review of Systems   Review of Systems  Respiratory:  Positive for shortness of breath.   All other systems reviewed and are negative.   Physical Exam Updated Vital Signs BP 134/62   Pulse 77   Temp (!) 97.4 F (36.3 C) (Oral)   Resp 20   Ht 5\' 3"  (1.6 m)  Wt 76 kg   SpO2 98%   BMI 29.68 kg/m  Physical Exam Vitals and nursing note reviewed.  Constitutional:      General: She is not in acute distress.    Appearance: Normal appearance. She is normal weight. She is not ill-appearing, toxic-appearing or diaphoretic.  HENT:     Head: Normocephalic and atraumatic.  Cardiovascular:     Rate and Rhythm: Normal rate and regular rhythm.     Heart sounds: Normal heart sounds.  Pulmonary:     Effort: Respiratory distress present.     Breath sounds: Wheezing present.     Comments: On bipap, getting continuous nebs Abdominal:     General: Abdomen is flat.     Palpations: Abdomen is soft.     Tenderness: There is no abdominal tenderness.   Musculoskeletal:        General: Normal range of motion.     Cervical back: Normal range of motion.     Right lower leg: No edema.     Left lower leg: No edema.  Skin:    General: Skin is warm and dry.  Neurological:     General: No focal deficit present.     Mental Status: She is alert.     Comments: Will open her eyes and follow commands  Psychiatric:        Mood and Affect: Mood normal.        Behavior: Behavior normal.     ED Results / Procedures / Treatments   Labs (all labs ordered are listed, but only abnormal results are displayed) Labs Reviewed  CBC WITH DIFFERENTIAL/PLATELET - Abnormal; Notable for the following components:      Result Value   WBC 17.9 (*)    RBC 5.38 (*)    Hemoglobin 15.1 (*)    HCT 51.6 (*)    MCHC 29.3 (*)    Neutro Abs 14.8 (*)    Monocytes Absolute 1.2 (*)    Abs Immature Granulocytes 0.25 (*)    All other components within normal limits  BASIC METABOLIC PANEL WITH GFR - Abnormal; Notable for the following components:   Chloride 94 (*)    CO2 37 (*)    Glucose, Bld 184 (*)    All other components within normal limits  I-STAT CHEM 8, ED - Abnormal; Notable for the following components:   Chloride 95 (*)    BUN 26 (*)    Glucose, Bld 187 (*)    Calcium , Ion 1.10 (*)    TCO2 38 (*)    Hemoglobin 17.0 (*)    HCT 50.0 (*)    All other components within normal limits  I-STAT VENOUS BLOOD GAS, ED - Abnormal; Notable for the following components:   pCO2, Ven 82.9 (*)    pO2, Ven 169 (*)    Bicarbonate 41.4 (*)    TCO2 44 (*)    Acid-Base Excess 11.0 (*)    Calcium , Ion 1.10 (*)    HCT 47.0 (*)    Hemoglobin 16.0 (*)    All other components within normal limits  I-STAT VENOUS BLOOD GAS, ED - Abnormal; Notable for the following components:   pO2, Ven 91 (*)    Acid-base deficit 4.0 (*)    Calcium , Ion 0.66 (*)    HCT 27.0 (*)    Hemoglobin 9.2 (*)    All other components within normal limits  TROPONIN I (HIGH SENSITIVITY)   TROPONIN I (HIGH SENSITIVITY)    EKG EKG  Interpretation Date/Time:  Thursday Dec 02 2023 11:14:16 EDT Ventricular Rate:  92 PR Interval:  163 QRS Duration:  93 QT Interval:  379 QTC Calculation: 469 R Axis:   118  Text Interpretation: Sinus rhythm Right atrial enlargement Right axis deviation Probable anteroseptal infarct, old No significant change since last tracing Confirmed by Celesta Coke ([id number]) on 12/02/2023 11:28:00 AM  Radiology DG Chest Portable 1 View Result Date: 12/02/2023 CLINICAL DATA:  Respiratory distress EXAM: PORTABLE CHEST 1 VIEW COMPARISON:  11/24/2023 FINDINGS: The heart size and mediastinal contours are within normal limits. Both lungs are clear. The visualized skeletal structures are unremarkable. IMPRESSION: No active disease. Electronically Signed   By: Janeece Mechanic M.D.   On: 12/02/2023 13:25    Procedures .Critical Care  Performed by: Sherra Dk, PA-C Authorized by: Lakysha Kossman A, PA-C   Critical care provider statement:    Critical care time (minutes):  75   Critical care was necessary to treat or prevent imminent or life-threatening deterioration of the following conditions:  Respiratory failure   Critical care was time spent personally by me on the following activities:  Development of treatment plan with patient or surrogate, discussions with primary provider, evaluation of patient's response to treatment, examination of patient, obtaining history from patient or surrogate, ordering and review of laboratory studies, ordering and review of radiographic studies, pulse oximetry, re-evaluation of patient's condition and review of old charts   Care discussed with: admitting provider       Medications Ordered in ED Medications  ondansetron  (ZOFRAN ) 4 MG/2ML injection (4 mg  Given 12/02/23 1116)  methylPREDNISolone  sodium succinate (SOLU-MEDROL ) 125 mg/2 mL injection 125 mg (125 mg Intravenous Given 12/02/23 1127)  albuterol  (PROVENTIL ) (2.5  MG/3ML) 0.083% nebulizer solution (10 mg/hr Nebulization Given 12/02/23 1138)    ED Course/ Medical Decision Making/ A&P                                 Medical Decision Making Amount and/or Complexity of Data Reviewed Labs: ordered. Radiology: ordered.  Risk Prescription drug management.   This patient is a 63 y.o. female who presents to the ED for concern of respiratory distress, this involves an extensive number of treatment options, and is a complaint that carries with it a high risk of complications and morbidity. The emergent differential diagnosis prior to evaluation includes, but is not limited to,  CHF, pericardial effusion/tamponade, arrhythmias, ACS, COPD, asthma, bronchitis, pneumonia, pneumothorax, PE, anemia   This is not an exhaustive differential.   Past Medical History / Co-morbidities / Social History:  has a past medical history of A-fib (HCC), Chronic hypercapnic respiratory failure (HCC), Chronic hypoxic respiratory failure (HCC), COPD (chronic obstructive pulmonary disease) (HCC), DVT (deep venous thrombosis) (HCC), Hypertension, Hyperthyroidism, Ischemic stroke (HCC), OSA (obstructive sleep apnea), and RVF (right ventricular failure) (HCC).  Additional history: Chart reviewed. Pertinent results include: just discharged on 5/24 after presenting similarly with respiratory distress requiring bipap attributed to COPD, appears she was equally as somnolent on this admission  Physical Exam: Physical exam performed. The pertinent findings include: initially tripoding, using accessory muscles for breathing, diminished breath sounds throughout, somnolent on exam. Improved with duonebs, solu-medrol , and bi-pap  Lab Tests: I ordered, and personally interpreted labs.  The pertinent results include:  WBC 17.9, potentially due to recent steroid use. CO2 82.9 consistent with previous. pH 7.3. Troponin WNL   Imaging Studies: I ordered imaging studies including  CXR. I  independently visualized and interpreted imaging which showed NAD. I agree with the radiologist interpretation.   Cardiac Monitoring:  The patient was maintained on a cardiac monitor.  My attending physician Dr. Nora Beal viewed and interpreted the cardiac monitored which showed an underlying rhythm of: no STEMI. I agree with this interpretation.   Medications: I ordered medication including zofran , solu-medrol , duo-nebs  for COPD exacerbation. Reevaluation of the patient after these medicines showed that the patient improved. I have reviewed the patients home medicines and have made adjustments as needed.   Disposition: After consideration of the diagnostic results and the patients response to treatment, I feel that patient will require admission for hypoxic respiratory failure likely due to COPD exacerbation.  Discussed same with patient is understanding and agreement.  Discussed patient with hospitalist who accepts patient for admission  I discussed this case with my attending physician Dr. Nora Beal who cosigned this note including patient's presenting symptoms, physical exam, and planned diagnostics and interventions. Attending physician stated agreement with plan or made changes to plan which were implemented.    Final Clinical Impression(s) / ED Diagnoses Final diagnoses:  COPD exacerbation [facility])    Rx / DC Orders ED Discharge Orders     None         Sherra Dk, PA-C 12/02/23 1616    [address] [place], [place] 12/03/23 [phone]

## 2023-12-02 NOTE — Progress Notes (Signed)
 Patient transported to 3M9 on the BiPAP 100% without complications or clinical deterioration. Report given to unit RT. ICU RN's at bedside for cares of patient.

## 2023-12-02 NOTE — Progress Notes (Signed)
 RT attempted aline x2 unsuccessful. RN made aware.

## 2023-12-02 NOTE — ED Notes (Signed)
 Attempted to give report to transport patient to ICU however RN unavailable at this time. Charge RN made aware.

## 2023-12-02 NOTE — Consult Note (Signed)
 Asked to see this 63 year old with severe COPD/emphysema who follows at [facility]. She is on home NIV and has had recurrent admissions at least 4 hospital visits in the last 2 months, she is also on 3 L of oxygen all the time.  She is maintained on a regimen of Advair and Incruse. She was discharged on 5/24 after a 4-day admission requiring BiPAP, discharge ABG was 7.3 6/101/100 on BiPAP.  Diamox  was added to her regimen.  She continues to smoke about a pack per day. She called EMS for respiratory distress, was agitated on arrival to the ED , placed on BiPAP in over a span of 5 to 6 hours became more somnolent although respiratory distress improved. Initial VBG 7.31/83/169  On exam -somnolent opens eyes to deep stimulus but barely follows commands, decreased breath sounds bilateral, faint expiratory rhonchi, S1-S2 regular, sinus on monitor, cool right hand with good radial pulses, 1+ edema.  Labs show bicarbonate 37 lowered from her baseline 40 to, leukocytosis and polycythemia. Chest x-ray shows prominent pulmonary arteries and no new infiltrates.  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  There is no family available to discuss advanced directives, and not full CODE STATUS during her prior admissions - We will keep her on triple therapy nebs and IV Solu-Medrol  40 every 12  Paroxysmal atrial fibrillation -maintained on Eliquis , hold Toprol  for now  Hyperthyroidism-on methimazole   My definitive care time was 40 minutes  Joevon Holliman V. Villa Greaser MD

## 2023-12-02 NOTE — H&P (Incomplete)
 History and Physical    Patient: Yvette Booth ZOX:[id number] DOB: 28-Aug-1960 DOA: 12/02/2023 DOS: the patient was seen and examined on 12/02/2023 PCP: [facility]  Patient coming from: Home  Chief Complaint:  Chief Complaint  Patient presents with   Respiratory Distress   HPI: Yvette Booth is a 63 y.o. female with medical history significant of chronic hypercapnic and hypoxic respiratory failure on 3 lit of [place] oxygen, DVT on eliquis , hypertension, tobacco use , hyperthyroidism, h/o CVA, OSA, COPD, on bipap at night, PAF, was brought in for respiratory distress. Patient is altered and unable to give any history at this time. She is on BIPAP  Review of Systems: unable to review all systems due to the inability of the patient to answer questions. Past Medical History:  Diagnosis Date   A-fib (HCC)    Chronic hypercapnic respiratory failure (HCC)    BIPAP at night   Chronic hypoxic respiratory failure (HCC)    COPD (chronic obstructive pulmonary disease) (HCC)    DVT (deep venous thrombosis) (HCC)    Hypertension    Hyperthyroidism    Ischemic stroke (HCC)    OSA (obstructive sleep apnea)    RVF (right ventricular failure) (HCC)    History reviewed. No pertinent surgical history. Social History:  reports that she has been smoking cigarettes. She started smoking about 4 months ago. She has a 0.4 pack-year smoking history. She does not have any smokeless tobacco history on file. She reports that she does not currently use alcohol. She reports that she does not use drugs.  Allergies  Allergen Reactions   Seroquel [Quetiapine] Other (See Comments)    Delirium and confusion     History reviewed. No pertinent family history.  Prior to Admission medications   Medication Sig Start Date End Date Taking? Authorizing Provider  acetaZOLAMIDE  (DIAMOX ) 250 MG tablet Take 1 tablet (250 mg total) by mouth 2 (two) times daily for 14 days. 11/26/23 12/08/2023  Unk Garb, DO   albuterol  (VENTOLIN  HFA) 108 [phone] Base) MCG/ACT inhaler Inhale 2 puffs into the lungs every 6 (six) hours as needed for wheezing or shortness of breath.    [provider]  apixaban  (ELIQUIS ) 5 MG TABS tablet Take 5 mg by mouth in the morning and at bedtime.    [provider]  fluticasone -salmeterol (ADVAIR) 250-50 MCG/ACT AEPB Inhale 1 puff into the lungs in the morning and at bedtime. 09/01/23   Ozell Blunt, MD  furosemide  (LASIX ) 40 MG tablet Take 40 mg by mouth in the morning.    [provider]  ipratropium-albuterol  (DUONEB) 0.5-2.5 (3) MG/3ML SOLN Take 3 mLs by nebulization in the morning, at noon, in the evening, and at bedtime for 14 days, THEN 3 mLs every 6 (six) hours as needed for up to 28 days. 11/26/23 01/07/24  Unk Garb, DO  methimazole  (TAPAZOLE ) 10 MG tablet Take 20 mg by mouth in the morning.    [provider]  metoprolol  succinate (TOPROL -XL) 50 MG 24 hr tablet Take 50 mg by mouth in the morning. Take with or immediately following a meal.    [provider]  Nicotine  (NICODERM CQ  TD) Place 1 patch onto the skin daily as needed (for smoking cessation).    [provider]  OXYGEN Inhale 2 L/min into the lungs continuous.    [provider]  predniSONE  (DELTASONE ) 20 MG tablet Take 2 tablets (40 mg total) by mouth daily with breakfast for 3 days, THEN  1.5 tablets (30 mg total) daily with breakfast for 3 days, THEN 1 tablet (20 mg total) daily with breakfast for 3 days, THEN 0.5 tablets (10 mg total) daily with breakfast for 3 days. 11/27/23 12/09/23  Unk Garb, DO  umeclidinium bromide  (INCRUSE ELLIPTA ) 62.5 MCG/ACT AEPB Inhale 1 puff into the lungs daily. 09/01/23   Ozell Blunt, MD    Physical Exam: Vitals:   12/02/23 1400 12/02/23 1430 12/02/23 1500 12/02/23 1527  BP: (!) 116/56 130/64 134/62   Pulse: 67 79 76 77  Resp: 20 (!) 25 (!) 24 20  Temp:      TempSrc:      SpO2: 100% 99% 100% 98%  Weight:      Height:        Results for orders placed or performed during the hospital encounter of 12/02/23 (from the past 24 hours)  CBC with Differential     Status: Abnormal   Collection Time: 12/02/23 11:19 AM  Result Value Ref Range   WBC 17.9 (H) 4.0 - 10.5 K/uL   RBC 5.38 (H) 3.87 - 5.11 MIL/uL   Hemoglobin 15.1 (H) 12.0 - 15.0 g/dL   HCT 29.5 (H) 62.1 - 30.8 %   MCV 95.9 80.0 - 100.0 fL   MCH 28.1 26.0 - 34.0 pg   MCHC 29.3 (L) 30.0 - 36.0 g/dL   RDW 65.7 84.6 - 96.2 %   Platelets 268 150 - 400 K/uL   nRBC 0.0 0.0 - 0.2 %   Neutrophils Relative % 83 %   Neutro Abs 14.8 (H) 1.7 - 7.7 K/uL   Lymphocytes Relative 9 %   Lymphs Abs 1.6 0.7 - 4.0 K/uL   Monocytes Relative 7 %   Monocytes Absolute 1.2 (H) 0.1 - 1.0 K/uL   Eosinophils Relative 0 %   Eosinophils Absolute 0.0 0.0 - 0.5 K/uL   Basophils Relative 0 %   Basophils Absolute 0.1 0.0 - 0.1 K/uL   Immature Granulocytes 1 %   Abs Immature Granulocytes 0.25 (H) 0.00 - 0.07 K/uL  Basic metabolic panel     Status: Abnormal   Collection Time: 12/02/23 11:19 AM  Result Value Ref Range   Sodium 140 135 - 145 mmol/L   Potassium 4.0 3.5 - 5.1 mmol/L   Chloride 94 (L) 98 - 111 mmol/L   CO2 37 (H) 22 - 32 mmol/L   Glucose, Bld 184 (H) 70 - 99 mg/dL   BUN 21 8 - 23 mg/dL   Creatinine, Ser 9.52 0.44 - 1.00 mg/dL   Calcium  9.1 8.9 - 10.3 mg/dL   GFR, Estimated >84 >13 mL/min   Anion gap 9 5 - 15  Troponin I (High Sensitivity)     Status: None   Collection Time: 12/02/23 11:19 AM  Result Value Ref Range   Troponin I (High Sensitivity) 7 <18 ng/L  I-stat chem 8, ED (not at [facility], [facility] or [facility])     Status: Abnormal   Collection Time: 12/02/23 11:28 AM  Result Value Ref Range   Sodium 139 135 - 145 mmol/L   Potassium 4.1 3.5 - 5.1 mmol/L   Chloride 95 (L) 98 - 111 mmol/L   BUN 26 (H) 8 - 23 mg/dL   Creatinine, Ser 2.44 0.44 - 1.00 mg/dL   Glucose, Bld 010 (H) 70 - 99 mg/dL   Calcium , Ion 1.10 (L) 1.15 - 1.40 mmol/L   TCO2 38 (H) 22 - 32 mmol/L    Hemoglobin 17.0 (H) 12.0 - 15.0  g/dL   HCT 16.1 (H) 09.6 - 04.5 %  I-Stat venous blood gas, ([facility] ED, [facility], [facility])     Status: Abnormal   Collection Time: 12/02/23 11:28 AM  Result Value Ref Range   pH, Ven 7.307 7.25 - 7.43   pCO2, Ven 82.9 (HH) 44 - 60 mmHg   pO2, Ven 169 (H) 32 - 45 mmHg   Bicarbonate 41.4 (H) 20.0 - 28.0 mmol/L   TCO2 44 (H) 22 - 32 mmol/L   O2 Saturation 99 %   Acid-Base Excess 11.0 (H) 0.0 - 2.0 mmol/L   Sodium 138 135 - 145 mmol/L   Potassium 4.0 3.5 - 5.1 mmol/L   Calcium , Ion 1.10 (L) 1.15 - 1.40 mmol/L   HCT 47.0 (H) 36.0 - 46.0 %   Hemoglobin 16.0 (H) 12.0 - 15.0 g/dL   Sample type VENOUS   Troponin I (High Sensitivity)     Status: None   Collection Time: 12/02/23  1:23 PM  Result Value Ref Range   Troponin I (High Sensitivity) 5 <18 ng/L  I-Stat venous blood gas, ([facility], [facility], [facility])     Status: Abnormal   Collection Time: 12/02/23  3:22 PM  Result Value Ref Range   pH, Ven 7.295 7.25 - 7.43   pCO2, Ven 45.4 44 - 60 mmHg   pO2, Ven 91 (H) 32 - 45 mmHg   Bicarbonate 22.1 20.0 - 28.0 mmol/L   TCO2 23 22 - 32 mmol/L   O2 Saturation 96 %   Acid-base deficit 4.0 (H) 0.0 - 2.0 mmol/L   Sodium 145 135 - 145 mmol/L   Potassium 3.6 3.5 - 5.1 mmol/L   Calcium , Ion 0.66 (LL) 1.15 - 1.40 mmol/L   HCT 27.0 (L) 36.0 - 46.0 %   Hemoglobin 9.2 (L) 12.0 - 15.0 g/dL   Sample type VENOUS    Comment NOTIFIED PHYSICIAN     Data Reviewed: Results for orders placed or performed during the hospital encounter of 12/02/23 (from the past 24 hours)  CBC with Differential     Status: Abnormal   Collection Time: 12/02/23 11:19 AM  Result Value Ref Range   WBC 17.9 (H) 4.0 - 10.5 K/uL   RBC 5.38 (H) 3.87 - 5.11 MIL/uL   Hemoglobin 15.1 (H) 12.0 - 15.0 g/dL   HCT 40.9 (H) 81.1 - 91.4 %   MCV 95.9 80.0 - 100.0 fL   MCH 28.1 26.0 - 34.0 pg   MCHC 29.3 (L) 30.0 - 36.0 g/dL   RDW 78.2 95.6 - 21.3 %   Platelets 268 150 - 400 K/uL   nRBC 0.0 0.0 - 0.2 %   Neutrophils Relative %  83 %   Neutro Abs 14.8 (H) 1.7 - 7.7 K/uL   Lymphocytes Relative 9 %   Lymphs Abs 1.6 0.7 - 4.0 K/uL   Monocytes Relative 7 %   Monocytes Absolute 1.2 (H) 0.1 - 1.0 K/uL   Eosinophils Relative 0 %   Eosinophils Absolute 0.0 0.0 - 0.5 K/uL   Basophils Relative 0 %   Basophils Absolute 0.1 0.0 - 0.1 K/uL   Immature Granulocytes 1 %   Abs Immature Granulocytes 0.25 (H) 0.00 - 0.07 K/uL  Basic metabolic panel     Status: Abnormal   Collection Time: 12/02/23 11:19 AM  Result Value Ref Range   Sodium 140 135 - 145 mmol/L   Potassium 4.0 3.5 - 5.1 mmol/L   Chloride 94 (L) 98 - 111 mmol/L   CO2  37 (H) 22 - 32 mmol/L   Glucose, Bld 184 (H) 70 - 99 mg/dL   BUN 21 8 - 23 mg/dL   Creatinine, Ser 1.61 0.44 - 1.00 mg/dL   Calcium  9.1 8.9 - 10.3 mg/dL   GFR, Estimated >09 >60 mL/min   Anion gap 9 5 - 15  Troponin I (High Sensitivity)     Status: None   Collection Time: 12/02/23 11:19 AM  Result Value Ref Range   Troponin I (High Sensitivity) 7 <18 ng/L  I-stat chem 8, ED (not at [facility], [facility] or [facility])     Status: Abnormal   Collection Time: 12/02/23 11:28 AM  Result Value Ref Range   Sodium 139 135 - 145 mmol/L   Potassium 4.1 3.5 - 5.1 mmol/L   Chloride 95 (L) 98 - 111 mmol/L   BUN 26 (H) 8 - 23 mg/dL   Creatinine, Ser 4.54 0.44 - 1.00 mg/dL   Glucose, Bld 098 (H) 70 - 99 mg/dL   Calcium , Ion 1.10 (L) 1.15 - 1.40 mmol/L   TCO2 38 (H) 22 - 32 mmol/L   Hemoglobin 17.0 (H) 12.0 - 15.0 g/dL   HCT 11.9 (H) 14.7 - 82.9 %  I-Stat venous blood gas, ([facility], [facility], [facility])     Status: Abnormal   Collection Time: 12/02/23 11:28 AM  Result Value Ref Range   pH, Ven 7.307 7.25 - 7.43   pCO2, Ven 82.9 (HH) 44 - 60 mmHg   pO2, Ven 169 (H) 32 - 45 mmHg   Bicarbonate 41.4 (H) 20.0 - 28.0 mmol/L   TCO2 44 (H) 22 - 32 mmol/L   O2 Saturation 99 %   Acid-Base Excess 11.0 (H) 0.0 - 2.0 mmol/L   Sodium 138 135 - 145 mmol/L   Potassium 4.0 3.5 - 5.1 mmol/L   Calcium , Ion 1.10 (L) 1.15 - 1.40 mmol/L   HCT 47.0  (H) 36.0 - 46.0 %   Hemoglobin 16.0 (H) 12.0 - 15.0 g/dL   Sample type VENOUS   Troponin I (High Sensitivity)     Status: None   Collection Time: 12/02/23  1:23 PM  Result Value Ref Range   Troponin I (High Sensitivity) 5 <18 ng/L  I-Stat venous blood gas, ([facility], [facility], [facility])     Status: Abnormal   Collection Time: 12/02/23  3:22 PM  Result Value Ref Range   pH, Ven 7.295 7.25 - 7.43   pCO2, Ven 45.4 44 - 60 mmHg   pO2, Ven 91 (H) 32 - 45 mmHg   Bicarbonate 22.1 20.0 - 28.0 mmol/L   TCO2 23 22 - 32 mmol/L   O2 Saturation 96 %   Acid-base deficit 4.0 (H) 0.0 - 2.0 mmol/L   Sodium 145 135 - 145 mmol/L   Potassium 3.6 3.5 - 5.1 mmol/L   Calcium , Ion 0.66 (LL) 1.15 - 1.40 mmol/L   HCT 27.0 (L) 36.0 - 46.0 %   Hemoglobin 9.2 (L) 12.0 - 15.0 g/dL   Sample type VENOUS    Comment NOTIFIED PHYSICIAN      Assessment and Plan:      Advance Care Planning:   Code Status: Full Code ***  Consults: ***  Family Communication: ***  Severity of Illness: {Observation/Inpatient:21159}  Author: Feliciana Horn, MD 12/02/2023 5:11 PM  For on call review [email or web address].

## 2023-12-02 NOTE — ED Notes (Signed)
 Patient responsive to painful stimuli. Sating 87% on bipap. Respiratory called to bedside. Admitted paged.

## 2023-12-02 NOTE — Progress Notes (Addendum)
 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  BiPAP settings were adjusted and patient was admitted to the ICU.  Mental status on evaluation is borderline but still responsive to painful stimulus at this time.  Protecting airway.  Presented with acute hypercapnic hypoxic respiratory failure, repeat ABG ordered by primary team  Patient has significant somnolence but still responsive to sternal rub  ABG with pH 7.19/pCO2 123  eICU Interventions  Adjusted the BiPAP minimum rate and increased pressure support  Increased the overall minute ventilation from 6.5 L to >19L   Will obtain arterial line, still high risk for intubation.  Will attempt to establish second IV  DVT prophylaxis with apixaban  GI prophylaxis with pantoprazole   2159 -reevaluated the patient in the room, still responsive to stimulus, spontaneously reaching for the BiPAP, min ventilation still around 11-12 L  2335 -reassessed the patient, now sleeping comfortably with minimal elation of 14 L.  Does intermittently develop a leak but this is to be expected with high pressure ventilation.  Mentation still appropriate-unable to get arterial line but the patient was somewhat combative during the procedure.  Will switch ABG to VBG with morning labs.  Intervention Category Intermediate Interventions: Respiratory distress - evaluation and management  Lamoyne Hessel 12/02/2023, 9:13 PM

## 2023-12-02 NOTE — ED Notes (Signed)
 Patient with continued expiratory wheezing, tolerating BiPAP well.

## 2023-12-02 NOTE — H&P (Signed)
 NAME:  Yvette Booth, MRN:  [id number], DOB:  [birth year], LOS: 0 ADMISSION DATE:  12/02/2023, CONSULTATION DATE:  12/02/23 REFERRING MD:  Feliciana Horn, MD, CHIEF COMPLAINT:  Respiratory distress/COPD    History of Present Illness:  63 year old female with history of afib on eliquis , COPD, chronic hypoxic respiratory failure on 2L [place] at home, current smoker, HTN and DVT. Patient was recently admitted to [facility] one week ago for a COPD exacerbation sent home on prednisone , Diamox and PRN albuterol  and has been admitted 5 times since February of this year for management of her COPD. She presented to the ED this afternoon with respiratory distress; initially restless and sitting in tripod position then became somnolent. Upon my assessment patient is unable to answer questions appropriately, difficult to arouse requiring repeat stimulation to open eyes, is requiring BiPAP; 15/6 50% with tidal volumes in the 200s.  Pertinent  Medical History  COPD, smoker, DVT, Afib on eliquis , CVA, OSA  Significant Hospital Events: Including procedures, antibiotic start and stop dates in addition to other pertinent events   5/29 admit to ICU; Bipap,   Interim History / Subjective:    Objective    Blood pressure 134/62, pulse 77, temperature (!) 97.4 F (36.3 C), temperature source Oral, resp. rate 20, height 5\' 3"  (1.6 m), weight 76 kg, SpO2 98%.    FiO2 (%):  [40 %-50 %] 40 % PEEP:  [6 cmH20] 6 cmH20 Pressure Support:  [12 cmH20] 12 cmH20  No intake or output data in the 24 hours ending 12/02/23 1742 Filed Weights   12/02/23 1110  Weight: 76 kg    Examination: General: chronically ill appearing, pale  HENT: Normocephalic, Bipap in place, eyes non-icteric Lungs: Expiratory wheezing, and scattered rhonchi, use of accessory muscles  Cardiovascular: RRR, +2 pulses, R hand cool to palpation all others warm, +1 edema in lower extremities Abdomen: Soft, non-distended Extremities: scattered bruising,  dry Neuro: Somnolent requiring repeat stimulation to open eyes, not following commands, not purposeful.  Resolved problem list   Assessment and Plan   Acute on Chronic respiratory failure; hypercarbic and hypoxic; 2/2 AECOPD  Baseline CO2 in the 70-80s Sent home on diamox. Not sure to what extent this impacted her breathing but certainly put her at risk for worsening resp status if not able to meet increased resp demand.  Plan Admit to ICU Send ABG, Scheduled yupelri, brovana and pulmicort w/ PRN SABA Keep spo2 >88 Monitor mental status Low threshold to intubate given mental status Continue IV Steroids  Azith x 5d R/O respiratory virus Cont. Home lasix   Daily Nicotine  patch Needs palliative care consult..Also, better approach to her resp failure would be to consider nocturnal NIPPV   Acute metabolic encephalopathy. Seems most likely 2/2 hypercarbia Plan Ck tsh Treat resp acidosis Ck uds  Supportive care  Leukocytosis; afebrile Plan Trend CBC Monitor fever  PAF Plan  Continue Eliquis  Optimize electrolytes mag>2 K>4 Hold home metoprolol  XR  DVT Plan  Continue Eliquis   At risk for Electrolyte imbalance Plan Repeat CMP Replace as needed   Hyperthyroidism  Plan Continue home methimazole  Checking tsh   Hx CVA Unclear what if any deficits she has baseline plan Cont. Eliquis   Best Practice (right click and "Reselect all SmartList Selections" daily)   Diet/type: NPO DVT prophylaxis DOAC Pressure ulcer(s): N/A GI prophylaxis: N/A Lines: N/A Foley:  N/A Code Status:  full code  Labs   CBC: Recent Labs  Lab 11/26/23 0339 12/02/23 1119 12/02/23 1128 12/02/23 1522  WBC 10.0 17.9*  --   --   NEUTROABS 9.2* 14.8*  --   --   HGB 14.6 15.1* 16.0*  17.0* 9.2*  HCT 49.0* 51.6* 47.0*  50.0* 27.0*  MCV 96.1 95.9  --   --   PLT 243 268  --   --     Basic Metabolic Panel: Recent Labs  Lab 11/26/23 0339 11/27/23 0836 12/02/23 1119 12/02/23 1128  12/02/23 1522  NA 140 143 140 138  139 145  K 3.6 3.2* 4.0 4.0  4.1 3.6  CL 85* 92* 94* 95*  --   CO2 44* 42* 37*  --   --   GLUCOSE 136* 102* 184* 187*  --   BUN 28* 27* 21 26*  --   CREATININE 0.48 0.77 0.62 0.70  --   CALCIUM  9.5 9.7 9.1  --   --    GFR: Estimated Creatinine Clearance: 70.2 mL/min (by C-G formula based on SCr of 0.7 mg/dL). Recent Labs  Lab 11/26/23 0339 12/02/23 1119  WBC 10.0 17.9*    Liver Function Tests: No results for input(s): "AST", "ALT", "ALKPHOS", "BILITOT", "PROT", "ALBUMIN" in the last 168 hours. No results for input(s): "LIPASE", "AMYLASE" in the last 168 hours. No results for input(s): "AMMONIA" in the last 168 hours.  ABG    Component Value Date/Time   PHART 7.36 11/24/2023 1735   PCO2ART 101 (HH) 11/24/2023 1735   PO2ART 100 11/24/2023 1735   HCO3 22.1 12/02/2023 1522   TCO2 23 12/02/2023 1522   ACIDBASEDEF 4.0 (H) 12/02/2023 1522   O2SAT 96 12/02/2023 1522     Coagulation Profile: No results for input(s): "INR", "PROTIME" in the last 168 hours.  Cardiac Enzymes: No results for input(s): "CKTOTAL", "CKMB", "CKMBINDEX", "TROPONINI" in the last 168 hours.  HbA1C: Hgb A1c MFr Bld  Date/Time Value Ref Range Status  08/21/2023 10:19 AM 5.1 4.8 - 5.6 % Final    Comment:    (NOTE) Pre diabetes:          5.7%-6.4%  Diabetes:              >6.4%  Glycemic control for   <7.0% adults with diabetes     CBG: No results for input(s): "GLUCAP" in the last 168 hours.  Review of Systems:   Not able   Past Medical History:  She,  has a past medical history of A-fib (HCC), Chronic hypercapnic respiratory failure (HCC), Chronic hypoxic respiratory failure (HCC), COPD (chronic obstructive pulmonary disease) (HCC), DVT (deep venous thrombosis) (HCC), Hypertension, Hyperthyroidism, Ischemic stroke (HCC), OSA (obstructive sleep apnea), and RVF (right ventricular failure) (HCC).   Surgical History:  History reviewed. No pertinent  surgical history.   Social History:   reports that she has been smoking cigarettes. She started smoking about 4 months ago. She has a 0.4 pack-year smoking history. She does not have any smokeless tobacco history on file. She reports that she does not currently use alcohol. She reports that she does not use drugs.   Family History:  Her family history is not on file.   Allergies Allergies  Allergen Reactions   Seroquel [Quetiapine] Other (See Comments)    Delirium and confusion      Home Medications  Prior to Admission medications   Medication Sig Start Date End Date Taking? Authorizing Provider  acetaZOLAMIDE (DIAMOX) 250 MG tablet Take 1 tablet (250 mg total) by mouth 2 (two) times daily for 14 days. 11/26/23 12/05/2023  Unk Garb, DO  albuterol  (VENTOLIN  HFA) 108 (90 Base) MCG/ACT inhaler Inhale 2 puffs into the lungs every 6 (six) hours as needed for wheezing or shortness of breath.    [provider]  apixaban  (ELIQUIS ) 5 MG TABS tablet Take 5 mg by mouth in the morning and at bedtime.    [provider]  fluticasone -salmeterol (ADVAIR) 250-50 MCG/ACT AEPB Inhale 1 puff into the lungs in the morning and at bedtime. 09/01/23   Ozell Blunt, MD  furosemide  (LASIX ) 40 MG tablet Take 40 mg by mouth in the morning.    [provider]  ipratropium-albuterol  (DUONEB) 0.5-2.5 (3) MG/3ML SOLN Take 3 mLs by nebulization in the morning, at noon, in the evening, and at bedtime for 14 days, THEN 3 mLs every 6 (six) hours as needed for up to 28 days. 11/26/23 01/07/24  Unk Garb, DO  methimazole  (TAPAZOLE ) 10 MG tablet Take 20 mg by mouth in the morning.    [provider]  metoprolol  succinate (TOPROL -XL) 50 MG 24 hr tablet Take 50 mg by mouth in the morning. Take with or immediately following a meal.    [provider]  Nicotine  (NICODERM CQ  TD) Place 1 patch onto the skin daily as needed (for smoking cessation).    [provider]  OXYGEN Inhale 2  L/min into the lungs continuous.    [provider]  predniSONE  (DELTASONE ) 20 MG tablet Take 2 tablets (40 mg total) by mouth daily with breakfast for 3 days, THEN 1.5 tablets (30 mg total) daily with breakfast for 3 days, THEN 1 tablet (20 mg total) daily with breakfast for 3 days, THEN 0.5 tablets (10 mg total) daily with breakfast for 3 days. 11/27/23 12/09/23  Unk Garb, DO  umeclidinium bromide  (INCRUSE ELLIPTA ) 62.5 MCG/ACT AEPB Inhale 1 puff into the lungs daily. 09/01/23   Ozell Blunt, MD     Critical care time: 32 min
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
## 2023-12-03 DIAGNOSIS — I480 Paroxysmal atrial fibrillation: Secondary | ICD-10-CM | POA: Diagnosis not present

## 2023-12-03 DIAGNOSIS — J9621 Acute and chronic respiratory failure with hypoxia: Secondary | ICD-10-CM

## 2023-12-03 DIAGNOSIS — J449 Chronic obstructive pulmonary disease, unspecified: Secondary | ICD-10-CM | POA: Diagnosis not present

## 2023-12-03 DIAGNOSIS — J9622 Acute and chronic respiratory failure with hypercapnia: Secondary | ICD-10-CM | POA: Diagnosis not present

## 2023-12-03 LAB — BLOOD GAS, VENOUS
Acid-Base Excess: 17.6 mmol/L — ABNORMAL HIGH (ref 0.0–2.0)
Bicarbonate: 51.1 mmol/L — ABNORMAL HIGH (ref 20.0–28.0)
O2 Saturation: 71.2 %
Patient temperature: 37
pCO2, Ven: 122 mmHg (ref 44–60)
pH, Ven: 7.23 — ABNORMAL LOW (ref 7.25–7.43)
pO2, Ven: 37 mmHg (ref 32–45)

## 2023-12-03 LAB — CBC WITH DIFFERENTIAL/PLATELET
Abs Immature Granulocytes: 0.06 10*3/uL (ref 0.00–0.07)
Basophils Absolute: 0 10*3/uL (ref 0.0–0.1)
Basophils Relative: 0 %
Eosinophils Absolute: 0 10*3/uL (ref 0.0–0.5)
Eosinophils Relative: 0 %
HCT: 48.9 % — ABNORMAL HIGH (ref 36.0–46.0)
Hemoglobin: 14.4 g/dL (ref 12.0–15.0)
Immature Granulocytes: 1 %
Lymphocytes Relative: 2 %
Lymphs Abs: 0.2 10*3/uL — ABNORMAL LOW (ref 0.7–4.0)
MCH: 28.2 pg (ref 26.0–34.0)
MCHC: 29.4 g/dL — ABNORMAL LOW (ref 30.0–36.0)
MCV: 95.9 fL (ref 80.0–100.0)
Monocytes Absolute: 0.4 10*3/uL (ref 0.1–1.0)
Monocytes Relative: 4 %
Neutro Abs: 8.7 10*3/uL — ABNORMAL HIGH (ref 1.7–7.7)
Neutrophils Relative %: 93 %
Platelets: 166 10*3/uL (ref 150–400)
RBC: 5.1 MIL/uL (ref 3.87–5.11)
RDW: 14 % (ref 11.5–15.5)
WBC: 9.3 10*3/uL (ref 4.0–10.5)
nRBC: 0 % (ref 0.0–0.2)

## 2023-12-03 LAB — GLUCOSE, CAPILLARY
Glucose-Capillary: 137 mg/dL — ABNORMAL HIGH (ref 70–99)
Glucose-Capillary: 115 mg/dL — ABNORMAL HIGH (ref 70–99)
Glucose-Capillary: 120 mg/dL — ABNORMAL HIGH (ref 70–99)
Glucose-Capillary: 139 mg/dL — ABNORMAL HIGH (ref 70–99)
Glucose-Capillary: 93 mg/dL (ref 70–99)

## 2023-12-03 LAB — BASIC METABOLIC PANEL WITH GFR
Anion gap: 9 (ref 5–15)
BUN: 33 mg/dL — ABNORMAL HIGH (ref 8–23)
CO2: 38 mmol/L — ABNORMAL HIGH (ref 22–32)
Calcium: 9.2 mg/dL (ref 8.9–10.3)
Chloride: 95 mmol/L — ABNORMAL LOW (ref 98–111)
Creatinine, Ser: 0.86 mg/dL (ref 0.44–1.00)
GFR, Estimated: >60 mL/min (ref >60)
Glucose, Bld: 153 mg/dL — ABNORMAL HIGH (ref 70–99)
Potassium: 4.2 mmol/L (ref 3.5–5.1)
Sodium: 142 mmol/L (ref 135–145)

## 2023-12-03 LAB — TSH: TSH: 2.045 u[IU]/mL (ref 0.350–4.500)

## 2023-12-03 MED ORDER — ORAL CARE MOUTH RINSE
15.0000 mL | OROMUCOSAL | Status: DC | PRN
  Administered 2023-12-04 (×4): 15 mL via OROMUCOSAL

## 2023-12-03 MED ORDER — APIXABAN 5 MG PO TABS: 5.0000 mg | ORAL_TABLET | Freq: Two times a day (BID) | ORAL | Status: DC

## 2023-12-03 MED ORDER — METHYLPREDNISOLONE SODIUM SUCC 40 MG IJ SOLR
40.0000 mg | Freq: Two times a day (BID) | INTRAMUSCULAR | Status: DC
  Administered 2023-12-03 – 2023-12-07 (×9): 40 mg via INTRAVENOUS
  Filled 2023-12-03 (×9): qty 1

## 2023-12-03 MED ORDER — ENOXAPARIN SODIUM 80 MG/0.8ML IJ SOSY
75.0000 mg | PREFILLED_SYRINGE | Freq: Two times a day (BID) | INTRAMUSCULAR | Status: DC
  Filled 2023-12-03: qty 0.75

## 2023-12-03 MED ORDER — MUPIROCIN 2 % EX OINT
1.0000 | TOPICAL_OINTMENT | Freq: Two times a day (BID) | CUTANEOUS | Status: AC
Start: 2023-12-03 — End: 2023-12-07
  Administered 2023-12-03 – 2023-12-07 (×9): 1 via NASAL
  Filled 2023-12-03 (×2): qty 22

## 2023-12-03 MED ORDER — DEXMEDETOMIDINE HCL IN NACL 400 MCG/100ML IV SOLN
INTRAVENOUS | Status: AC
  Administered 2023-12-03: 0.5 ug/kg/h via INTRAVENOUS
  Filled 2023-12-03: qty 100

## 2023-12-03 MED ORDER — ORAL CARE MOUTH RINSE
15.0000 mL | OROMUCOSAL | Status: DC
Start: 2023-12-03 — End: 2023-12-07
  Administered 2023-12-03 – 2023-12-06 (×11): 15 mL via OROMUCOSAL

## 2023-12-03 MED ORDER — DEXMEDETOMIDINE HCL IN NACL 400 MCG/100ML IV SOLN
0.0000 ug/kg/h | INTRAVENOUS | Status: DC
  Administered 2023-12-04: 0.7 ug/kg/h via INTRAVENOUS
  Administered 2023-12-04: 0.8 ug/kg/h via INTRAVENOUS
  Administered 2023-12-04: 1 ug/kg/h via INTRAVENOUS
  Administered 2023-12-05: 0.6 ug/kg/h via INTRAVENOUS
  Filled 2023-12-03 (×5): qty 100

## 2023-12-03 MED ORDER — APIXABAN 5 MG PO TABS
5.0000 mg | ORAL_TABLET | Freq: Two times a day (BID) | ORAL | Status: DC
  Filled 2023-12-03: qty 1

## 2023-12-03 NOTE — IPAL (Signed)
  Interdisciplinary Goals of Care Family Meeting   Date carried out: 12/03/2023  Location of the meeting: Conference room  Member's involved: Nurse Practitioner, Bedside Registered Nurse, and Family Member or next of kin  Durable Power of Attorney or acting medical decision maker: daughter, Camilo Cella     Discussion: We discussed goals of care for [employer] .  Multiple family members present.  Updated and discussed end stage lung disease with recurrent hospitalizations with poor long term prognosis.  Family frustrated that pt continues to smoke up to 2ppd and at times not fully compliant with home NIV.  States she would want at least short term life support to give her a chance but doubt long term support.  Will need to continue ongoing GOCs with family pending trajectory and with patient when able, family agreeable to PMT consult.    Code status:   Code Status: Full Code   Disposition: Continue current acute care  Time spent for the meeting: 30 mins    Early Glisson, NP  12/03/2023, 7:32 PM

## 2023-12-03 NOTE — TOC CM/SW Note (Signed)
 Transition of Care [facility]) - Inpatient Brief Assessment   Patient Details  Name: Yvette Booth MRN: [id number] Date of Birth: 29-May-1961  Transition of Care [facility]) CM/SW Contact:    Tom-Johnson, Angelique Ken, RN Phone Number: 12/03/2023, 1:12 PM   Clinical Narrative:  Patient presented to the ED with Respiratory distress and Altered Mental Status. Patient was placed on BIPAP, currently on 3L O2. Patient is on NIV at home. Currently on IV Solumedrol, IV abx and Neb tx. Patient was recently hospitalized for COPD Exacerbation, declined SNF and Home health at that time. Patient has been hospitalized four times in two mths.  CM unable to assess patient at this time d/t Lethargy. CM will continue to follow and assess patient at an appropriate time.               Transition of Care Asessment: Insurance and Status: Insurance coverage has been reviewed Patient has primary care physician: Yes Home environment has been reviewed: Yes Prior level of function:: Modified Independent Prior/Current Home Services: No current home services (Declined SNF and HH last admit.) Social Drivers of Health Review: SDOH reviewed no interventions necessary Readmission risk has been reviewed: Yes Transition of care needs: transition of care needs identified, TOC will continue to follow

## 2023-12-03 NOTE — Progress Notes (Signed)
 NAME:  Yvette Booth, MRN:  [id number], DOB:  June 21, 1961, LOS: 1 ADMISSION DATE:  12/02/2023, CONSULTATION DATE:  12/02/23 REFERRING MD:  Feliciana Horn, MD, CHIEF COMPLAINT:  Respiratory distress/COPD    History of Present Illness:  63 year old female with history of afib on eliquis , COPD, chronic hypoxic respiratory failure on 2L [place] at home, current smoker, HTN and DVT. Patient was recently admitted to [facility] one week ago for a COPD exacerbation sent home on prednisone , Diamox and PRN albuterol  and has been admitted 5 times since February of this year for management of her COPD. She presented to the ED this afternoon with respiratory distress; initially restless and sitting in tripod position then became somnolent. Upon my assessment patient is unable to answer questions appropriately, difficult to arouse requiring repeat stimulation to open eyes, is requiring BiPAP; 15/6 50% with tidal volumes in the 200s.  Pertinent  Medical History  COPD, smoker, DVT, Afib on eliquis , CVA, OSA  Significant Hospital Events: Including procedures, antibiotic start and stop dates in addition to other pertinent events   5/29 admit to ICU; Bipap,   Interim History / Subjective:   Critically ill, remains on BiPAP since admission  ABG was 7.1 9/120/154 last night   Objective    Blood pressure 128/73, pulse 83, temperature 98.1 F (36.7 C), temperature source Axillary, resp. rate 18, height 5\' 3"  (1.6 m), weight 75.2 kg, SpO2 99%.    Vent Mode: BIPAP;PCV FiO2 (%):  [35 %-60 %] 35 % Set Rate:  [14 bmp] 14 bmp PEEP:  [6 cmH20] 6 cmH20 Pressure Support:  [12 cmH20-25 cmH20] 18 cmH20   Intake/Output Summary (Last 24 hours) at 12/03/2023 1034 Last data filed at 12/02/2023 2100 Gross per 24 hour  Intake 250.08 ml  Output --  Net 250.08 ml   Filed Weights   12/02/23 1110 12/02/23 2028  Weight: 76 kg 75.2 kg    Examination: General: chronically ill appearing, pale  HENT: Normocephalic, Bipap in  place, eyes non-icteric Lungs: Expiratory wheezing, and scattered rhonchi, use of accessory muscles  Cardiovascular: RRR, +2 pulses, R hand cool to palpation all others warm, +1 edema in lower extremities Abdomen: Soft, non-distended Extremities: scattered bruising, dry Neuro: More awake, follows commands, moves all remedies to command.  Labs show normal electrolytes, bicarb 38, no leukocytosis  Resolved problem list   Assessment and Plan   Acute on Chronic respiratory failure; hypercarbic and hypoxic; 2/2 AECOPD  Baseline CO2 in the 70-80s Sent home on diamox after recent admission. Not sure to what extent this impacted her breathing but certainly put her at risk for worsening resp status if not able to meet increased resp demand.  Plan At breaks on BiPAP, tolerated 1 hour this morning Scheduled yupelri, brovana and pulmicort w/ PRN SABA Keep spo2 >88 IV Solu-Medrol  40 every 12 Azith x 5d Await respiratory viral panel Cont. Home lasix   Daily Nicotine  patch Needs palliative care consult, she has end-stage COPD already on home NIV.  Unfortunately she cannot participate in this discussion at this time  Acute metabolic encephalopathy. Seems most likely 2/2 hypercarbia Plan Ck uds  Supportive care   PAF Plan  Continue Eliquis  -if unable to take oral, will transition to subcu Lovenox Optimize electrolytes mag>2 K>4 Hold home metoprolol  XR  DVT Plan  Continue Eliquis    Hyperthyroidism  Plan Continue home methimazole  tsh ok  Hx CVA Unclear what if any deficits she has baseline plan Cont. Eliquis   Best Practice (right click and "  Reselect all SmartList Selections" daily)   Diet/type: NPO DVT prophylaxis DOAC Pressure ulcer(s): N/A GI prophylaxis: N/A Lines: N/A Foley:  N/A Code Status:  full code  Labs   CBC: Recent Labs  Lab 12/02/23 1119 12/02/23 1128 12/02/23 1522 12/03/23 0303  WBC 17.9*  --   --  9.3  NEUTROABS 14.8*  --   --  8.7*  HGB 15.1*  16.0*  17.0* 9.2* 14.4  HCT 51.6* 47.0*  50.0* 27.0* 48.9*  MCV 95.9  --   --  95.9  PLT 268  --   --  166    Basic Metabolic Panel: Recent Labs  Lab 11/27/23 0836 12/02/23 1119 12/02/23 1128 12/02/23 1522 12/03/23 0303  NA 143 140 138  139 145 142  K 3.2* 4.0 4.0  4.1 3.6 4.2  CL 92* 94* 95*  --  95*  CO2 42* 37*  --   --  38*  GLUCOSE 102* 184* 187*  --  153*  BUN 27* 21 26*  --  33*  CREATININE 0.77 0.62 0.70  --  0.86  CALCIUM  9.7 9.1  --   --  9.2   GFR: Estimated Creatinine Clearance: 65 mL/min (by C-G formula based on SCr of 0.86 mg/dL). Recent Labs  Lab 12/02/23 1119 12/03/23 0303  WBC 17.9* 9.3    Liver Function Tests: No results for input(s): "AST", "ALT", "ALKPHOS", "BILITOT", "PROT", "ALBUMIN" in the last 168 hours. No results for input(s): "LIPASE", "AMYLASE" in the last 168 hours. No results for input(s): "AMMONIA" in the last 168 hours.  ABG    Component Value Date/Time   PHART 7.19 (LL) 12/02/2023 2052   PCO2ART >123 (HH) 12/02/2023 2052   PO2ART 154 (H) 12/02/2023 2052   HCO3 51.1 (H) 12/03/2023 0303   TCO2 23 12/02/2023 1522   ACIDBASEDEF 4.0 (H) 12/02/2023 1522   O2SAT 71.2 12/03/2023 0303     Coagulation Profile: No results for input(s): "INR", "PROTIME" in the last 168 hours.  Cardiac Enzymes: No results for input(s): "CKTOTAL", "CKMB", "CKMBINDEX", "TROPONINI" in the last 168 hours.  HbA1C: Hgb A1c MFr Bld  Date/Time Value Ref Range Status  08/21/2023 10:19 AM 5.1 4.8 - 5.6 % Final    Comment:    (NOTE) Pre diabetes:          5.7%-6.4%  Diabetes:              >6.4%  Glycemic control for   <7.0% adults with diabetes     CBG: Recent Labs  Lab 12/02/23 2031 12/02/23 2356 12/03/23 0356 12/03/23 0827  GLUCAP 193* 150* 137* 115*    Critical care time: 33 min      Celene Coins MD. FCCP. [facility] Pager : [phone]  If no response to pager , please call [phone] until 7 pm After 7:00 pm  call Elink  [phone]   12/03/2023

## 2023-12-04 DIAGNOSIS — G9341 Metabolic encephalopathy: Secondary | ICD-10-CM

## 2023-12-04 DIAGNOSIS — Z720 Tobacco use: Secondary | ICD-10-CM | POA: Diagnosis not present

## 2023-12-04 DIAGNOSIS — J9622 Acute and chronic respiratory failure with hypercapnia: Secondary | ICD-10-CM | POA: Diagnosis not present

## 2023-12-04 DIAGNOSIS — J441 Chronic obstructive pulmonary disease with (acute) exacerbation: Secondary | ICD-10-CM

## 2023-12-04 DIAGNOSIS — J9621 Acute and chronic respiratory failure with hypoxia: Secondary | ICD-10-CM | POA: Diagnosis not present

## 2023-12-04 DIAGNOSIS — Z515 Encounter for palliative care: Secondary | ICD-10-CM | POA: Diagnosis not present

## 2023-12-04 DIAGNOSIS — J9601 Acute respiratory failure with hypoxia: Secondary | ICD-10-CM | POA: Diagnosis not present

## 2023-12-04 LAB — POCT I-STAT 7, (LYTES, BLD GAS, ICA,H+H)
Acid-Base Excess: 11 mmol/L — ABNORMAL HIGH (ref 0.0–2.0)
Bicarbonate: 42.3 mmol/L — ABNORMAL HIGH (ref 20.0–28.0)
Calcium, Ion: 1.29 mmol/L (ref 1.15–1.40)
HCT: 40 % (ref 36.0–46.0)
Hemoglobin: 13.6 g/dL (ref 12.0–15.0)
O2 Saturation: 91 %
Patient temperature: 98.3
Potassium: 4.2 mmol/L (ref 3.5–5.1)
Sodium: 144 mmol/L (ref 135–145)
TCO2: 45 mmol/L — ABNORMAL HIGH (ref 22–32)
pCO2 arterial: 91.9 mmHg (ref 32–48)
pH, Arterial: 7.27 — ABNORMAL LOW (ref 7.35–7.45)
pO2, Arterial: 73 mmHg — ABNORMAL LOW (ref 83–108)

## 2023-12-04 LAB — GLUCOSE, CAPILLARY
Glucose-Capillary: 116 mg/dL — ABNORMAL HIGH (ref 70–99)
Glucose-Capillary: 117 mg/dL — ABNORMAL HIGH (ref 70–99)
Glucose-Capillary: 123 mg/dL — ABNORMAL HIGH (ref 70–99)
Glucose-Capillary: 125 mg/dL — ABNORMAL HIGH (ref 70–99)
Glucose-Capillary: 140 mg/dL — ABNORMAL HIGH (ref 70–99)
Glucose-Capillary: 141 mg/dL — ABNORMAL HIGH (ref 70–99)

## 2023-12-04 LAB — BASIC METABOLIC PANEL WITH GFR
Anion gap: 7 (ref 5–15)
BUN: 36 mg/dL — ABNORMAL HIGH (ref 8–23)
CO2: 38 mmol/L — ABNORMAL HIGH (ref 22–32)
Calcium: 8.8 mg/dL — ABNORMAL LOW (ref 8.9–10.3)
Chloride: 98 mmol/L (ref 98–111)
Creatinine, Ser: 0.66 mg/dL (ref 0.44–1.00)
GFR, Estimated: >60 mL/min (ref >60)
Glucose, Bld: 132 mg/dL — ABNORMAL HIGH (ref 70–99)
Potassium: 4.7 mmol/L (ref 3.5–5.1)
Sodium: 143 mmol/L (ref 135–145)

## 2023-12-04 LAB — RESPIRATORY PANEL BY PCR

## 2023-12-04 LAB — CBC
HCT: 44.5 % (ref 36.0–46.0)
Hemoglobin: 13.2 g/dL (ref 12.0–15.0)
MCH: 28.3 pg (ref 26.0–34.0)
MCHC: 29.7 g/dL — ABNORMAL LOW (ref 30.0–36.0)
MCV: 95.3 fL (ref 80.0–100.0)
Platelets: 157 10*3/uL (ref 150–400)
RBC: 4.67 MIL/uL (ref 3.87–5.11)
RDW: 14.2 % (ref 11.5–15.5)
WBC: 6.2 10*3/uL (ref 4.0–10.5)
nRBC: 0 % (ref 0.0–0.2)

## 2023-12-04 MED ORDER — METOPROLOL TARTRATE 5 MG/5ML IV SOLN
5.0000 mg | Freq: Four times a day (QID) | INTRAVENOUS | Status: DC
  Administered 2023-12-04: 5 mg via INTRAVENOUS
  Administered 2023-12-04: 2.5 mg via INTRAVENOUS
  Filled 2023-12-04 (×2): qty 5

## 2023-12-04 MED ORDER — ENOXAPARIN SODIUM 80 MG/0.8ML IJ SOSY
80.0000 mg | PREFILLED_SYRINGE | Freq: Two times a day (BID) | INTRAMUSCULAR | Status: DC
  Administered 2023-12-04 – 2023-12-10 (×13): 80 mg via SUBCUTANEOUS
  Filled 2023-12-04 (×14): qty 0.8

## 2023-12-04 MED ORDER — METOPROLOL TARTRATE 5 MG/5ML IV SOLN
5.0000 mg | Freq: Four times a day (QID) | INTRAVENOUS | Status: DC | PRN
  Administered 2023-12-04 – 2023-12-05 (×2): 5 mg via INTRAVENOUS
  Filled 2023-12-04 (×2): qty 5

## 2023-12-04 MED ORDER — ALBUTEROL SULFATE (2.5 MG/3ML) 0.083% IN NEBU
2.5000 mg | INHALATION_SOLUTION | Freq: Four times a day (QID) | RESPIRATORY_TRACT | Status: DC
  Administered 2023-12-04 – 2023-12-07 (×12): 2.5 mg via RESPIRATORY_TRACT
  Filled 2023-12-04 (×12): qty 3

## 2023-12-04 NOTE — Progress Notes (Signed)
 NAME:  Yvette Booth, MRN:  [id number], DOB:  July 01, 1961, LOS: 2 ADMISSION DATE:  12/02/2023, CONSULTATION DATE:  12/02/23 REFERRING MD:  Feliciana Horn, MD, CHIEF COMPLAINT:  Respiratory distress/COPD    History of Present Illness:  62 year old female with history of afib on eliquis , COPD, chronic hypoxic respiratory failure on 2L [place] at home, current smoker, HTN and DVT. Patient was recently admitted to [facility] one week ago for a COPD exacerbation sent home on prednisone , Diamox  and PRN albuterol  and has been admitted 5 times since February of this year for management of her COPD. She presented to the ED this afternoon with respiratory distress; initially restless and sitting in tripod position then became somnolent. Upon my assessment patient is unable to answer questions appropriately, difficult to arouse requiring repeat stimulation to open eyes, is requiring BiPAP; 15/6 50% with tidal volumes in the 200s.  Pertinent  Medical History  COPD, smoker, DVT, Afib on eliquis , CVA, OSA  Significant Hospital Events: Including procedures, antibiotic start and stop dates in addition to other pertinent events   5/29 admit to ICU; Bipap,   Interim History / Subjective:  Placed on precedex  overnight for agitation  Objective    Blood pressure 106/61, pulse 60, temperature 98.3 F (36.8 C), temperature source Axillary, resp. rate 15, height 5\' 3"  (1.6 m), weight 77 kg, SpO2 96%.    FiO2 (%):  [35 %-60 %] 40 % PEEP:  [6 cmH20] 6 cmH20 Pressure Support:  [18 [id number] cmH20] 18 cmH20   Intake/Output Summary (Last 24 hours) at 12/04/2023 0844 Last data filed at 12/04/2023 0800 Gross per 24 hour  Intake 503.96 ml  Output 0 ml  Net 503.96 ml   Filed Weights   12/02/23 1110 12/02/23 2028 12/03/23 2024  Weight: 76 kg 75.2 kg 77 kg    Examination: Dex 0.6 General:  AoC ill appearing  HEENT: MM pink/ dry, pupils 3/r, full face mask in place Neuro: awake, intermittently restless but redirectable,  MAE, follows commands, speech unintelligible   CV: rr, NSR PULM:  remains on BiPAP 16/6, TVs 300-500s, 40%, diminished with diffuse exp wheeze, ongoing WOB/ rr ~30 on bipap, increased WOB just with mouth care GI: soft, b+, NT Extremities: warm/dry, no tibial edema, scattered bruising to UE Skin: no rashes   Labs> bicarb stable 38, sCr stable  Afebrile UOP 200 ml/24hrs, did have UOP overnight, just not charted  Net +563 ml  Resolved problem list   Assessment and Plan   Recurrent acute on chronic respiratory failure; hypercarbic and hypoxic; 2/2 AECOPD  Baseline CO2 in the 70-80s Sent home on diamox  after recent admission. Not sure to what extent this impacted her breathing but certainly put her at risk for worsening resp status if not able to meet increased resp demand.  Plan - given ongoing WOB, not ready for BiPAP break but improving mental status.  Remains high risk for intubation - cont scheduled triple therapy nebs, add scheduled albuterol  given ongoing wheezing - precedex  to tolerate BiPAP, minimize sedating meds - cont solumedrol 40mg  q 12 - azithro x 5 days, remains afebrile, normal WBC - RVP never sent, will send - hold lasix  for now, remains NPO - nicotine  patch - remains full code per GOC with family/ daughter 5/30, agreeable to PMT.     Acute metabolic encephalopathy. Seems most likely 2/2 hypercarbia, improving Plan - cont supportive care - precedex  to tolerate BiPAP   PAF Plan  - remains in NSR - unable to  tolerate orals given ongoing BiPAP needs - change eliquis  to lovenox  - toprol  change to IV lopressor  while NPO - optimize electrolytes   DVT Plan  - eliquis  to lovenox  while NPO   Hyperthyroidism  - TSH ok Plan - hold methimazole  for now  Hx CVA Unclear what if any deficits she has baseline plan - serial neuro monitoring   Best Practice (right click and "Reselect all SmartList Selections" daily)   Diet/type: NPO DVT prophylaxis DOAC> full  dose lovenox  Pressure ulcer(s): N/A GI prophylaxis: N/A Lines: N/A Foley:  N/A Code Status:  full code  Labs   CBC: Recent Labs  Lab 12/02/23 1119 12/02/23 1128 12/02/23 1522 12/03/23 0303 12/04/23 0311  WBC 17.9*  --   --  9.3 6.2  NEUTROABS 14.8*  --   --  8.7*  --   HGB 15.1* 16.0*  17.0* 9.2* 14.4 13.2  HCT 51.6* 47.0*  50.0* 27.0* 48.9* 44.5  MCV 95.9  --   --  95.9 95.3  PLT 268  --   --  166 157    Basic Metabolic Panel: Recent Labs  Lab 12/02/23 1119 12/02/23 1128 12/02/23 1522 12/03/23 0303 12/04/23 0311  NA 140 138  139 145 142 143  K 4.0 4.0  4.1 3.6 4.2 4.7  CL 94* 95*  --  95* 98  CO2 37*  --   --  38* 38*  GLUCOSE 184* 187*  --  153* 132*  BUN 21 26*  --  33* 36*  CREATININE 0.62 0.70  --  0.86 0.66  CALCIUM  9.1  --   --  9.2 8.8*   GFR: Estimated Creatinine Clearance: 70.7 mL/min (by C-G formula based on SCr of 0.66 mg/dL). Recent Labs  Lab 12/02/23 1119 12/03/23 0303 12/04/23 0311  WBC 17.9* 9.3 6.2    Liver Function Tests: No results for input(s): "AST", "ALT", "ALKPHOS", "BILITOT", "PROT", "ALBUMIN" in the last 168 hours. No results for input(s): "LIPASE", "AMYLASE" in the last 168 hours. No results for input(s): "AMMONIA" in the last 168 hours.  ABG    Component Value Date/Time   PHART 7.19 (LL) 12/02/2023 2052   PCO2ART >123 (HH) 12/02/2023 2052   PO2ART 154 (H) 12/02/2023 2052   HCO3 51.1 (H) 12/03/2023 0303   TCO2 23 12/02/2023 1522   ACIDBASEDEF 4.0 (H) 12/02/2023 1522   O2SAT 71.2 12/03/2023 0303     Coagulation Profile: No results for input(s): "INR", "PROTIME" in the last 168 hours.  Cardiac Enzymes: No results for input(s): "CKTOTAL", "CKMB", "CKMBINDEX", "TROPONINI" in the last 168 hours.  HbA1C: Hgb A1c MFr Bld  Date/Time Value Ref Range Status  08/21/2023 10:19 AM 5.1 4.8 - 5.6 % Final    Comment:    (NOTE) Pre diabetes:          5.7%-6.4%  Diabetes:              >6.4%  Glycemic control for    <7.0% adults with diabetes     CBG: Recent Labs  Lab 12/03/23 1628 12/03/23 2018 12/03/23 2344 12/04/23 0333 12/04/23 0735  GLUCAP 139* 93 116* 125* 123*    Critical care time: 35 min         Early Glisson, MSN, AG-ACNP-BC [facility] 12/04/2023, 8:44 AM  See Amion for pager If no response to pager , please call [phone] until 7pm After 7:00 pm call Elink  [phone]

## 2023-12-04 NOTE — Plan of Care (Signed)

## 2023-12-04 NOTE — Plan of Care (Signed)
  Problem: Metabolic: Goal: Ability to maintain appropriate glucose levels will improve Outcome: Progressing   Problem: Skin Integrity: Goal: Risk for impaired skin integrity will decrease Outcome: Progressing   Problem: Tissue Perfusion: Goal: Adequacy of tissue perfusion will improve Outcome: Progressing   Problem: Clinical Measurements: Goal: Ability to maintain clinical measurements within normal limits will improve Outcome: Progressing Goal: Will remain free from infection Outcome: Progressing Goal: Diagnostic test results will improve Outcome: Progressing Goal: Cardiovascular complication will be avoided Outcome: Progressing   Problem: Activity: Goal: Risk for activity intolerance will decrease Outcome: Progressing   Problem: Nutrition: Goal: Adequate nutrition will be maintained Outcome: Progressing   Problem: Coping: Goal: Level of anxiety will decrease Outcome: Progressing   Problem: Elimination: Goal: Will not experience complications related to bowel motility Outcome: Progressing Goal: Will not experience complications related to urinary retention Outcome: Progressing   Problem: Pain Managment: Goal: General experience of comfort will improve and/or be controlled Outcome: Progressing   Problem: Safety: Goal: Ability to remain free from injury will improve Outcome: Progressing   Problem: Skin Integrity: Goal: Risk for impaired skin integrity will decrease Outcome: Progressing

## 2023-12-04 NOTE — Consult Note (Signed)
 Consultation Note Date: 12/04/2023 at 1500  Patient Name: Yvette Booth  DOB: 1961/03/19  MRN: [id number]  Age / Sex: 63 y.o., female  PCP: [facility] Referring Physician: Trevor Fudge, MD  HPI/Patient Profile: 63 y.o. female  with past medical history of COPD (3L nasal cannula at baseline), RV failure, OSA (BiPAP nightly), PAF (Eliquis ), history of CVA, hyperthyroidism, HTN, DVT, and current tobacco use (2 PPD) admitted on 12/02/2023 with shortness of breath.    Patient is being treated for AECOPD.  She is currently on BiPAP.  Of note, patient was discharged on 5/24 for same acute illness.  This marks her sixth inpatient admission in the last 6 months.  PMD was consulted to support patient and family with goals of care discussions.  Clinical Assessment and Goals of Care: Extensive chart review completed prior to meeting patient including labs, vital signs, imaging, progress notes, orders, and available advanced directive documents from current and previous encounters.  I assessed the patient at bedside.  She is on BiPAP and Precedex , unable to participate in goals of care medical decision making independently at this time.  I counseled with RN Ron who shared that patient has 2 daughters but that patient's next of kin and decision maker is her daughter Camilo Cella.    RN endorses family meeting was held yesterday with multiple family members.  Outcome of that discussion remains that Camilo Cella wishes to continue full code and full scope.  RN also shares that while she is Management consultant, she takes into account opinions of all other family members present and that health literacy may be low.  After assessing patient and counseling with medical team, I attempted to speak with Camilo Cella over the phone.  No answer.  Voicemail was full and I was unable to leave a voicemail.  PMT will continue to  follow and support and attempt goals of care discussions with patient/family at a later date/time.  Primary Decision Maker NEXT OF KIN  Physical Exam Constitutional:      General: She is not in acute distress.    Appearance: She is ill-appearing.  HENT:     Head: Normocephalic.     Mouth/Throat:     Mouth: Mucous membranes are dry.  Pulmonary:     Comments: Bipap in place Abdominal:     Palpations: Abdomen is soft.  Skin:    General: Skin is dry.     Coloration: Skin is pale.  Neurological:     Comments: Precedex  in place     Palliative Assessment/Data: 30-40%     Thank you for this consult. Palliative medicine will continue to follow and assist holistically.   Time Total: 40 minutes  Time spent includes: Detailed review of medical records (labs, imaging, vital signs), medically appropriate exam (mental status, respiratory, cardiac, skin), discussed with treatment team, counseling and educating patient, family and staff, documenting clinical information, medication management and coordination of care.  Signed by: Eller Gut, DNP, FNP-BC Palliative Medicine   Please contact Palliative Medicine Team  providers via AMION for questions and concerns.

## 2023-12-05 DIAGNOSIS — J9621 Acute and chronic respiratory failure with hypoxia: Secondary | ICD-10-CM | POA: Diagnosis not present

## 2023-12-05 DIAGNOSIS — Z720 Tobacco use: Secondary | ICD-10-CM | POA: Diagnosis not present

## 2023-12-05 DIAGNOSIS — J9622 Acute and chronic respiratory failure with hypercapnia: Secondary | ICD-10-CM | POA: Diagnosis not present

## 2023-12-05 DIAGNOSIS — J441 Chronic obstructive pulmonary disease with (acute) exacerbation: Secondary | ICD-10-CM | POA: Diagnosis not present

## 2023-12-05 DIAGNOSIS — J9601 Acute respiratory failure with hypoxia: Secondary | ICD-10-CM | POA: Diagnosis not present

## 2023-12-05 LAB — COMPREHENSIVE METABOLIC PANEL WITH GFR
ALT: 11 U/L (ref 0–44)
AST: 13 U/L — ABNORMAL LOW (ref 15–41)
Albumin: 3.2 g/dL — ABNORMAL LOW (ref 3.5–5.0)
Alkaline Phosphatase: 50 U/L (ref 38–126)
Anion gap: 7 (ref 5–15)
BUN: 36 mg/dL — ABNORMAL HIGH (ref 8–23)
CO2: 40 mmol/L — ABNORMAL HIGH (ref 22–32)
Calcium: 8.9 mg/dL (ref 8.9–10.3)
Chloride: 100 mmol/L (ref 98–111)
Creatinine, Ser: 0.74 mg/dL (ref 0.44–1.00)
GFR, Estimated: >60 mL/min (ref >60)
Glucose, Bld: 107 mg/dL — ABNORMAL HIGH (ref 70–99)
Potassium: 4.7 mmol/L (ref 3.5–5.1)
Sodium: 147 mmol/L — ABNORMAL HIGH (ref 135–145)
Total Bilirubin: 0.9 mg/dL (ref 0.0–1.2)
Total Protein: 6 g/dL — ABNORMAL LOW (ref 6.5–8.1)

## 2023-12-05 LAB — GLUCOSE, CAPILLARY
Glucose-Capillary: 105 mg/dL — ABNORMAL HIGH (ref 70–99)
Glucose-Capillary: 106 mg/dL — ABNORMAL HIGH (ref 70–99)
Glucose-Capillary: 113 mg/dL — ABNORMAL HIGH (ref 70–99)
Glucose-Capillary: 124 mg/dL — ABNORMAL HIGH (ref 70–99)
Glucose-Capillary: 130 mg/dL — ABNORMAL HIGH (ref 70–99)
Glucose-Capillary: 85 mg/dL (ref 70–99)
Glucose-Capillary: 99 mg/dL (ref 70–99)

## 2023-12-05 LAB — CBC
HCT: 44.8 % (ref 36.0–46.0)
Hemoglobin: 12.9 g/dL (ref 12.0–15.0)
MCH: 28.3 pg (ref 26.0–34.0)
MCHC: 28.8 g/dL — ABNORMAL LOW (ref 30.0–36.0)
MCV: 98.2 fL (ref 80.0–100.0)
Platelets: 159 10*3/uL (ref 150–400)
RBC: 4.56 MIL/uL (ref 3.87–5.11)
RDW: 14.1 % (ref 11.5–15.5)
WBC: 6.5 10*3/uL (ref 4.0–10.5)
nRBC: 0 % (ref 0.0–0.2)

## 2023-12-05 MED ORDER — METOPROLOL TARTRATE 5 MG/5ML IV SOLN
5.0000 mg | Freq: Once | INTRAVENOUS | Status: AC
  Administered 2023-12-05: 5 mg via INTRAVENOUS
  Filled 2023-12-05: qty 5

## 2023-12-05 MED ORDER — METOPROLOL TARTRATE 5 MG/5ML IV SOLN
2.5000 mg | Freq: Four times a day (QID) | INTRAVENOUS | Status: DC | PRN
  Administered 2023-12-05 – 2023-12-10 (×3): 5 mg via INTRAVENOUS
  Filled 2023-12-05 (×3): qty 5

## 2023-12-05 MED ORDER — DEXTROSE 5 % IV SOLN: INTRAVENOUS | Status: AC

## 2023-12-05 NOTE — Plan of Care (Signed)

## 2023-12-05 NOTE — Progress Notes (Addendum)
 NAME:  Yvette Booth, MRN:  [id number], DOB:  1960-10-11, LOS: 3 ADMISSION DATE:  12/02/2023, CONSULTATION DATE:  12/02/23 REFERRING MD:  Feliciana Horn, MD, CHIEF COMPLAINT:  Respiratory distress/COPD    History of Present Illness:  63 year old female with history of afib on eliquis , COPD, chronic hypoxic respiratory failure on 2L [place] at home, current smoker, HTN and DVT. Patient was recently admitted to [facility] one week ago for a COPD exacerbation sent home on prednisone , Diamox  and PRN albuterol  and has been admitted 5 times since February of this year for management of her COPD. She presented to the ED this afternoon with respiratory distress; initially restless and sitting in tripod position then became somnolent. Upon my assessment patient is unable to answer questions appropriately, difficult to arouse requiring repeat stimulation to open eyes, is requiring BiPAP; 15/6 50% with tidal volumes in the 200s.  Pertinent  Medical History  COPD, smoker, DVT, Afib on eliquis , CVA, OSA  Significant Hospital Events: Including procedures, antibiotic start and stop dates in addition to other pertinent events   5/29 admit to ICU; Bipap 5/31 remains on BiPAP with ongoing WOB, precedex  for restlessness   Interim History / Subjective:  Trial of HHFNC 50L/ 45% this am> about the same WOB as on BiPAP, still following commands, but worsening ABG 1hr after, 7> now back on BiPAP RVP + rhinovirus, parainfluenza   Objective    Blood pressure (!) 199/87, pulse 88, temperature 98 F (36.7 C), temperature source Axillary, resp. rate (!) 31, height 5\' 3"  (1.6 m), weight 74 kg, SpO2 97%.    FiO2 (%):  [40 %-45 %] 45 % PEEP:  [5 cmH20] 5 cmH20 Pressure Support:  [18 cmH20] 18 cmH20   Intake/Output Summary (Last 24 hours) at 12/05/2023 1041 Last data filed at 12/05/2023 1000 Gross per 24 hour  Intake 606.91 ml  Output 524 ml  Net 82.91 ml   Filed Weights   12/02/23 2028 12/03/23 2024 12/05/23 0305  Weight:  75.2 kg 77 kg 74 kg   Examination: Dex now off General:  AoC ill appearing older female sitting upright in bed, ongoing WOB HEENT: MM pink/dry, pupils 3/r Neuro:  opens eyes, follows simple commands, tries to verbalize but unintelligible  CV: rr, NSR PULM:  ongoing WOB, severely diminished, diffuse exp wheeze GI: soft, bs+, NT Extremities: warm/dry, no tibial edema  Skin: no rashes, scattered ecchymosis to arms  Labs> Na 147, K 4.7, sCr 0.74 Afebrile UOP 524 ml/24hrs, plus unmeasured  1 BM I/O balance inaccurate with unmeasured urinary occurrences   Resolved problem list   Assessment and Plan   Recurrent acute on chronic respiratory failure; hypercarbic and hypoxic; 2/2 AECOPD to rhinovirus/ parainfluenza  Baseline CO2 in the 70-80s, ?90's Sent home on diamox  after recent admission. Not sure to what extent this impacted her breathing but certainly put her at risk for worsening resp status if not able to meet increased resp demand.  Plan - remains high risk for intubation - not able to tolerate HHFNC with worsening PH/ hypercarbia> now back on BIPAP, repeat VBG after  - cont scheduled triple therapy nebs, scheduled albuterol   - precedex  to tolerate BiPAP - cont solumedrol 40mg  q 12 - azithro day 4/ 5 days, remains afebrile, normal WBC - nicotine  patch - remains NPO - needs ongoing GOC given endstage lung process> PMT consulted.  Remains full code per GOC 5/30, likely no long term support - droplet precautions   Acute metabolic encephalopathy. Seems  most likely 2/2 hypercarbia, improving Plan - cont supportive care - precedex  to tolerate BiPAP   PAF Plan  - remains in NSR - unable to tolerate orals given ongoing BiPAP needs - change eliquis  to lovenox  - IV lopressor  while NPO - optimize electrolytes   DVT Plan  - hold eliquis , lovenox  while NPO   Hyperthyroidism  - TSH ok Plan - hold methimazole  for now   Hx CVA Unclear what if any deficits she has  baseline plan - serial neuro monitoring    Hypernatremia- likely due to insensible losses, NPO for several days Protein calorie malnutrition  - will need cortrak placement and free water replacement, water deficit , start D5W 50 ml/hr - trend renal panel - RD consult   Best Practice (right click and "Reselect all SmartList Selections" daily)   Diet/type: NPO DVT prophylaxis full dose lovenox  Pressure ulcer(s): N/A GI prophylaxis: PPI Lines: N/A Foley:  N/A Code Status:  full code  Pending update 6/1, no family at bedside.  Called daughter, Jessica> no answer, VM box full.   Labs   CBC: Recent Labs  Lab 12/02/23 1119 12/02/23 1128 12/02/23 1522 12/03/23 0303 12/04/23 0311 12/04/23 1015 12/05/23 0302  WBC 17.9*  --   --  9.3 6.2  --  6.5  NEUTROABS 14.8*  --   --  8.7*  --   --   --   HGB 15.1*   < > 9.2* 14.4 13.2 13.6 12.9  HCT 51.6*   < > 27.0* 48.9* 44.5 40.0 44.8  MCV 95.9  --   --  95.9 95.3  --  98.2  PLT 268  --   --  166 157  --  159   < > = values in this interval not displayed.    Basic Metabolic Panel: Recent Labs  Lab 12/02/23 1119 12/02/23 1128 12/02/23 1522 12/03/23 0303 12/04/23 0311 12/04/23 1015 12/05/23 0302  NA 140 138  139 145 142 143 144 147*  K 4.0 4.0  4.1 3.6 4.2 4.7 4.2 4.7  CL 94* 95*  --  95* 98  --  100  CO2 37*  --   --  38* 38*  --  40*  GLUCOSE 184* 187*  --  153* 132*  --  107*  BUN 21 26*  --  33* 36*  --  36*  CREATININE 0.62 0.70  --  0.86 0.66  --  0.74  CALCIUM  9.1  --   --  9.2 8.8*  --  8.9   GFR: Estimated Creatinine Clearance: 69.3 mL/min (by C-G formula based on SCr of 0.74 mg/dL). Recent Labs  Lab 12/02/23 1119 12/03/23 0303 12/04/23 0311 12/05/23 0302  WBC 17.9* 9.3 6.2 6.5    Liver Function Tests: Recent Labs  Lab 12/05/23 0302  AST 13*  ALT 11  ALKPHOS 50  BILITOT 0.9  PROT 6.0*  ALBUMIN 3.2*   No results for input(s): "LIPASE", "AMYLASE" in the last 168 hours. No results for  input(s): "AMMONIA" in the last 168 hours.  ABG    Component Value Date/Time   PHART 7.270 (L) 12/04/2023 1015   PCO2ART 91.9 (HH) 12/04/2023 1015   PO2ART 73 (L) 12/04/2023 1015   HCO3 42.3 (H) 12/04/2023 1015   TCO2 45 (H) 12/04/2023 1015   ACIDBASEDEF 4.0 (H) 12/02/2023 1522   O2SAT 91 12/04/2023 1015     Coagulation Profile: No results for input(s): "INR", "PROTIME" in the last 168 hours.  Cardiac Enzymes: No results  for input(s): "CKTOTAL", "CKMB", "CKMBINDEX", "TROPONINI" in the last 168 hours.  HbA1C: Hgb A1c MFr Bld  Date/Time Value Ref Range Status  08/21/2023 10:19 AM 5.1 4.8 - 5.6 % Final    Comment:    (NOTE) Pre diabetes:          5.7%-6.4%  Diabetes:              >6.4%  Glycemic control for   <7.0% adults with diabetes     CBG: Recent Labs  Lab 12/04/23 1528 12/04/23 1944 12/05/23 0024 12/05/23 0315 12/05/23 0728  GLUCAP 140* 141* 85 99 105*    Critical care time: 38 min         Early Glisson, MSN, AG-ACNP-BC [facility] 12/05/2023, 10:41 AM  See Amion for pager If no response to pager , please call [phone] until 7pm After 7:00 pm call Elink  [phone]

## 2023-12-05 NOTE — Progress Notes (Signed)
 Daily Progress Note   Patient Name: Yvette Booth       Date: 12/05/2023 DOB: 1960-10-18  Age: 63 y.o. MRN#: [id number] Attending Physician: Trevor Fudge, MD Primary Care Physician: [facility] Admit Date: 12/02/2023  Reason for Consultation/Follow-up: Establishing goals of care   Length of Stay: 3  Current Medications: Scheduled Meds:   albuterol   2.5 mg Nebulization QID   arformoterol   15 mcg Nebulization BID   budesonide  (PULMICORT ) nebulizer solution  0.5 mg Nebulization BID   Chlorhexidine  Gluconate Cloth  6 each Topical Daily   enoxaparin  (LOVENOX ) injection  80 mg Subcutaneous Q12H   insulin  aspart  0-15 Units Subcutaneous Q4H   methylPREDNISolone  (SOLU-MEDROL ) injection  40 mg Intravenous Q12H   metoprolol  tartrate  5 mg Intravenous Once   mupirocin  ointment  1 Application Nasal BID   mouth rinse  15 mL Mouth Rinse 4 times per day   pantoprazole  (PROTONIX ) IV  40 mg Intravenous Q24H   revefenacin   175 mcg Nebulization Daily    Continuous Infusions:  azithromycin  Stopped (12/04/23 1808)   dexmedetomidine  (PRECEDEX ) IV infusion Stopped (12/05/23 0933)    PRN Meds: albuterol , metoprolol  tartrate, nicotine , mouth rinse  Physical Exam Vitals reviewed.  Constitutional:      General: She is not in acute distress.    Appearance: She is ill-appearing.     Comments: bipap  HENT:     Head: Normocephalic and atraumatic.  Neurological:     Mental Status: She is easily aroused.             Vital Signs: BP (!) 199/87   Pulse 93   Temp 98 F (36.7 C) (Axillary)   Resp (!) 29   Ht 5\' 3"  (1.6 m)   Wt 74 kg   SpO2 (!) 89%   BMI 28.90 kg/m  SpO2: SpO2: (!) 89 % O2 Device: O2 Device: Bi-PAP O2 Flow Rate: O2 Flow Rate (L/min): 50  L/min    Patient Active Problem List   Diagnosis Date Noted   Acute respiratory failure with hypoxia (HCC) 12/02/2023   Acute on chronic respiratory failure (HCC) 12/02/2023   Chronic respiratory failure with hypoxia and hypercapnia (HCC) - on baseline 3 L/min, baseline PCO2 about 95 mmHg 11/24/2023   COPD with acute exacerbation (HCC) 11/24/2023   Tobacco  abuse 11/15/2023   Hyperthyroidism 08/21/2023   PAF (paroxysmal atrial fibrillation) (HCC) 08/21/2023   Obstructive sleep apnea treated with bilevel positive airway pressure (BiPAP) 08/21/2023   History of CVA (cerebrovascular accident) 08/21/2023   Acute on chronic respiratory failure with hypoxia and hypercapnia (HCC) 08/20/2023   Encounter for smoking cessation counseling 08/20/2023    Palliative Care Assessment & Plan   Patient Profile: 63 y.o. female  with past medical history of COPD (3L nasal cannula at baseline), RV failure, OSA (BiPAP nightly), PAF (Eliquis ), history of CVA, hyperthyroidism, HTN, DVT, and current tobacco use (2 PPD) admitted on 12/02/2023 with shortness of breath. Patient is being treated for recurrent acute on chronic respiratory failure. She is currently on BiPAP.   Of note, patient was discharged on 5/24 for same acute illness.  This marks her sixth inpatient admission i  Assessment: Extensive chart review completed prior to meeting patient including labs, vital signs, imaging, progress notes, orders, and available advanced directive documents from current and previous encounters. CCM had goals of care meeting 5/30 to discuss end stage lung disease with recurrent hospitalization with poor long term prognosis. The patient and family were open to further discussion of GOC with PMT. I assessed the patient at bedside.  She is on BiPAP and Precedex , unable to participate in goals of care medical decision making independently at this time.   9:45 Attempted to call patient's daughter Camilo Cella who is a Scientist, research (life sciences). Voicemail was full and I was unable to leave a voicemail.   Recommendations/Plan: Full code and full scope PMT will continue to attempt goals of care discussions with patient and family at a later date/time.    Code Status:    Code Status Orders  (From admission, onward)           Start     Ordered   12/02/23 1710  Full code  Continuous       Question:  By:  Answer:  Default: patient does not have capacity for decision making, no surrogate or prior directive available   12/02/23 1710           Extensive chart review has been completed prior to seeing the patient including labs, vital signs, imaging, progress/consult notes, orders, medications, and available advance directive documents.  Care plan was discussed with bedside RN  Time spent: 25 minutes  Thank you for allowing the Palliative Medicine Team to assist in the care of this patient.    [name], NP  Please contact Palliative Medicine Team phone at [phone] for questions and concerns.

## 2023-12-05 DEATH — deceased

## 2023-12-06 ENCOUNTER — Inpatient Hospital Stay (HOSPITAL_COMMUNITY)

## 2023-12-06 DIAGNOSIS — E43 Unspecified severe protein-calorie malnutrition: Secondary | ICD-10-CM | POA: Insufficient documentation

## 2023-12-06 DIAGNOSIS — E870 Hyperosmolality and hypernatremia: Secondary | ICD-10-CM

## 2023-12-06 DIAGNOSIS — G9341 Metabolic encephalopathy: Secondary | ICD-10-CM | POA: Diagnosis not present

## 2023-12-06 DIAGNOSIS — J441 Chronic obstructive pulmonary disease with (acute) exacerbation: Secondary | ICD-10-CM | POA: Diagnosis not present

## 2023-12-06 DIAGNOSIS — J9621 Acute and chronic respiratory failure with hypoxia: Secondary | ICD-10-CM | POA: Diagnosis not present

## 2023-12-06 DIAGNOSIS — J9622 Acute and chronic respiratory failure with hypercapnia: Secondary | ICD-10-CM | POA: Diagnosis not present

## 2023-12-06 LAB — RENAL FUNCTION PANEL
Albumin: 3.3 g/dL — ABNORMAL LOW (ref 3.5–5.0)
Anion gap: 12 (ref 5–15)
BUN: 34 mg/dL — ABNORMAL HIGH (ref 8–23)
CO2: 36 mmol/L — ABNORMAL HIGH (ref 22–32)
Calcium: 9.2 mg/dL (ref 8.9–10.3)
Chloride: 99 mmol/L (ref 98–111)
Creatinine, Ser: 0.72 mg/dL (ref 0.44–1.00)
GFR, Estimated: >60 mL/min (ref >60)
Glucose, Bld: 158 mg/dL — ABNORMAL HIGH (ref 70–99)
Phosphorus: 3.2 mg/dL (ref 2.5–4.6)
Potassium: 4.3 mmol/L (ref 3.5–5.1)
Sodium: 147 mmol/L — ABNORMAL HIGH (ref 135–145)

## 2023-12-06 LAB — POCT I-STAT 7, (LYTES, BLD GAS, ICA,H+H)
Acid-Base Excess: 10 mmol/L — ABNORMAL HIGH (ref 0.0–2.0)
Bicarbonate: 40.4 mmol/L — ABNORMAL HIGH (ref 20.0–28.0)
Calcium, Ion: 1.24 mmol/L (ref 1.15–1.40)
HCT: 37 % (ref 36.0–46.0)
Hemoglobin: 12.6 g/dL (ref 12.0–15.0)
O2 Saturation: 100 %
Patient temperature: 97.6
Potassium: 4.1 mmol/L (ref 3.5–5.1)
Sodium: 145 mmol/L (ref 135–145)
TCO2: 43 mmol/L — ABNORMAL HIGH (ref 22–32)
pCO2 arterial: 86.4 mmHg (ref 32–48)
pH, Arterial: 7.275 — ABNORMAL LOW (ref 7.35–7.45)
pO2, Arterial: 291 mmHg — ABNORMAL HIGH (ref 83–108)

## 2023-12-06 LAB — GLUCOSE, CAPILLARY
Glucose-Capillary: 149 mg/dL — ABNORMAL HIGH (ref 70–99)
Glucose-Capillary: 133 mg/dL — ABNORMAL HIGH (ref 70–99)
Glucose-Capillary: 157 mg/dL — ABNORMAL HIGH (ref 70–99)
Glucose-Capillary: 139 mg/dL — ABNORMAL HIGH (ref 70–99)
Glucose-Capillary: 204 mg/dL — ABNORMAL HIGH (ref 70–99)

## 2023-12-06 LAB — MAGNESIUM: Magnesium: 2.3 mg/dL (ref 1.7–2.4)

## 2023-12-06 MED ORDER — PHENYLEPHRINE 80 MCG/ML (10ML) SYRINGE FOR IV PUSH (FOR BLOOD PRESSURE SUPPORT)
PREFILLED_SYRINGE | INTRAVENOUS | Status: AC
  Administered 2023-12-06: 16 ug
  Filled 2023-12-06: qty 10

## 2023-12-06 MED ORDER — FENTANYL 2500MCG IN NS 250ML (10MCG/ML) PREMIX INFUSION
0.0000 ug/h | INTRAVENOUS | Status: DC
  Administered 2023-12-06: 50 ug/h via INTRAVENOUS
  Administered 2023-12-06 – 2023-12-07 (×2): 200 ug/h via INTRAVENOUS
  Administered 2023-12-08: 25 ug/h via INTRAVENOUS
  Administered 2023-12-08: 200 ug/h via INTRAVENOUS
  Filled 2023-12-06 (×5): qty 250

## 2023-12-06 MED ORDER — DEXTROSE 5 % IV SOLN: INTRAVENOUS | Status: DC

## 2023-12-06 MED ORDER — FAMOTIDINE 20 MG PO TABS
20.0000 mg | ORAL_TABLET | Freq: Two times a day (BID) | ORAL | Status: DC
  Administered 2023-12-06 – 2023-12-07 (×2): 20 mg
  Filled 2023-12-06 (×2): qty 1

## 2023-12-06 MED ORDER — SUCCINYLCHOLINE CHLORIDE 200 MG/10ML IV SOSY
PREFILLED_SYRINGE | INTRAVENOUS | Status: AC
  Filled 2023-12-06: qty 10

## 2023-12-06 MED ORDER — FENTANYL CITRATE PF 50 MCG/ML IJ SOSY: 50.0000 ug | PREFILLED_SYRINGE | Freq: Once | INTRAMUSCULAR | Status: AC

## 2023-12-06 MED ORDER — DOCUSATE SODIUM 50 MG/5ML PO LIQD
100.0000 mg | Freq: Two times a day (BID) | ORAL | Status: DC
Start: 2023-12-06 — End: 2023-12-10
  Administered 2023-12-06 – 2023-12-10 (×8): 100 mg
  Filled 2023-12-06 (×8): qty 10

## 2023-12-06 MED ORDER — MIDAZOLAM HCL 2 MG/2ML IJ SOLN
INTRAMUSCULAR | Status: AC
  Administered 2023-12-06: 2 mg via INTRAVENOUS
  Filled 2023-12-06: qty 2

## 2023-12-06 MED ORDER — FENTANYL CITRATE PF 50 MCG/ML IJ SOSY: 25.0000 ug | PREFILLED_SYRINGE | Freq: Once | INTRAMUSCULAR | Status: DC

## 2023-12-06 MED ORDER — POLYETHYLENE GLYCOL 3350 17 G PO PACK
17.0000 g | PACK | Freq: Every day | ORAL | Status: DC
  Administered 2023-12-09 – 2023-12-10 (×2): 17 g
  Filled 2023-12-06 (×2): qty 1

## 2023-12-06 MED ORDER — ETOMIDATE 2 MG/ML IV SOLN
INTRAVENOUS | Status: AC
  Filled 2023-12-06: qty 20

## 2023-12-06 MED ORDER — MIDAZOLAM HCL 2 MG/2ML IJ SOLN: 2.0000 mg | Freq: Once | INTRAMUSCULAR | Status: AC

## 2023-12-06 MED ORDER — ROCURONIUM BROMIDE 10 MG/ML (PF) SYRINGE: 60.0000 mg | PREFILLED_SYRINGE | Freq: Once | INTRAVENOUS | Status: AC

## 2023-12-06 MED ORDER — ORAL CARE MOUTH RINSE
15.0000 mL | OROMUCOSAL | Status: DC
  Administered 2023-12-07 (×4): 15 mL via OROMUCOSAL

## 2023-12-06 MED ORDER — FENTANYL BOLUS VIA INFUSION
25.0000 ug | INTRAVENOUS | Status: DC | PRN
  Administered 2023-12-06 – 2023-12-08 (×3): 50 ug via INTRAVENOUS
  Administered 2023-12-08 – 2023-12-10 (×5): 100 ug via INTRAVENOUS

## 2023-12-06 MED ORDER — ROCURONIUM BROMIDE 10 MG/ML (PF) SYRINGE
PREFILLED_SYRINGE | INTRAVENOUS | Status: AC
  Administered 2023-12-06: 60 mg via INTRAVENOUS
  Filled 2023-12-06: qty 10

## 2023-12-06 MED ORDER — MIDAZOLAM HCL 2 MG/2ML IJ SOLN
2.0000 mg | INTRAMUSCULAR | Status: DC | PRN
  Administered 2023-12-06: 2 mg via INTRAVENOUS
  Filled 2023-12-06: qty 2

## 2023-12-06 MED ORDER — MIDAZOLAM HCL 2 MG/2ML IJ SOLN
1.0000 mg | Freq: Once | INTRAMUSCULAR | Status: AC
  Administered 2023-12-06: 1 mg via INTRAVENOUS
  Filled 2023-12-06: qty 2

## 2023-12-06 MED ORDER — DEXMEDETOMIDINE HCL IN NACL 400 MCG/100ML IV SOLN
0.0000 ug/kg/h | INTRAVENOUS | Status: DC
  Administered 2023-12-06: 0.4 ug/kg/h via INTRAVENOUS
  Administered 2023-12-06: 1.2 ug/kg/h via INTRAVENOUS
  Administered 2023-12-07 (×2): 0.7 ug/kg/h via INTRAVENOUS
  Administered 2023-12-07: 0.8 ug/kg/h via INTRAVENOUS
  Administered 2023-12-08 – 2023-12-09 (×3): 0.7 ug/kg/h via INTRAVENOUS
  Administered 2023-12-09: 0.8 ug/kg/h via INTRAVENOUS
  Administered 2023-12-09 – 2023-12-10 (×2): 0.7 ug/kg/h via INTRAVENOUS
  Filled 2023-12-06 (×4): qty 100
  Filled 2023-12-06: qty 200
  Filled 2023-12-06 (×6): qty 100

## 2023-12-06 MED ORDER — KETAMINE HCL 50 MG/5ML IJ SOSY: 1.0000 mg/kg | PREFILLED_SYRINGE | Freq: Once | INTRAMUSCULAR | Status: AC

## 2023-12-06 MED ORDER — KETAMINE HCL 50 MG/5ML IJ SOSY
PREFILLED_SYRINGE | INTRAMUSCULAR | Status: AC
  Administered 2023-12-06: 74 mg via INTRAVENOUS
  Filled 2023-12-06: qty 10

## 2023-12-06 MED ORDER — LORAZEPAM 2 MG/ML IJ SOLN
1.0000 mg | Freq: Once | INTRAMUSCULAR | Status: AC
  Administered 2023-12-06: 1 mg via INTRAVENOUS
  Filled 2023-12-06: qty 1

## 2023-12-06 MED ORDER — PHENYLEPHRINE 80 MCG/ML (10ML) SYRINGE FOR IV PUSH (FOR BLOOD PRESSURE SUPPORT)
PREFILLED_SYRINGE | INTRAVENOUS | Status: AC
  Administered 2023-12-06: 10 ug
  Filled 2023-12-06: qty 10

## 2023-12-06 MED ORDER — FENTANYL CITRATE PF 50 MCG/ML IJ SOSY
PREFILLED_SYRINGE | INTRAMUSCULAR | Status: AC
Start: 2023-12-06 — End: 2023-12-06
  Administered 2023-12-06: 50 ug via INTRAVENOUS
  Filled 2023-12-06: qty 2

## 2023-12-06 MED ORDER — MORPHINE SULFATE (PF) 2 MG/ML IV SOLN
2.0000 mg | Freq: Once | INTRAVENOUS | Status: AC | PRN
  Administered 2023-12-06: 2 mg via INTRAVENOUS
  Filled 2023-12-06: qty 1

## 2023-12-06 NOTE — Plan of Care (Signed)
     Chart reviewed and updates received from CCM. Patient with worsening breathing status on bipap. Team was able to reach daughter to confirm goals of care. Plan for patient to be intubated to try to clear CO2 to allow patient the ability to participate in GOC discussion. No PMT needs today. PMT will continue to shadow chart. Please reach out to PMT if needs arise.   Thank you for your referral and allowing PMT to assist in [place] Giovanelli's care.   Joaquim Muir, NP Palliative Medicine Team  Team Phone # [phone]   NO CHARGE

## 2023-12-06 NOTE — Procedures (Signed)
 Intubation Procedure Note  Yvette Booth  [id number]  [birth year]  Date:12/06/23  Time:4:27 PM   Provider Performing:Yvette Booth    Procedure: Intubation (31500)  Indication(s) Respiratory Failure  Consent Risks of the procedure as well as the alternatives and risks of each were explained to the patient and/or caregiver.  Consent for the procedure was obtained and is signed in the bedside chart   Anesthesia Versed, Fentanyl, Rocuronium, and ketamine   Time Out Verified patient identification, verified procedure, site/side was marked, verified correct patient position, special equipment/implants available, medications/allergies/relevant history reviewed, required imaging and test results available.   Sterile Technique Usual hand hygeine, masks, and gloves were used   Procedure Description Patient positioned in bed supine.  Sedation given as noted above.  Patient was intubated with endotracheal tube using Glidescope.  View was Grade 1 full glottis .  Number of attempts was 1.  Colorimetric CO2 detector was consistent with tracheal placement.   Complications/Tolerance Hypotension corrected with phenylephrine and fluid.  Chest X-ray is ordered to verify placement.   EBL None   Specimen(s) None   Yvette Lair, MD [facility] ICU Physician [facility]  Pager: [phone] Or Epic Secure Chat After hours: [phone].  12/06/2023, 4:31 PM

## 2023-12-06 NOTE — Progress Notes (Addendum)
 eLink Physician-Brief Progress Note Patient Name: Mystery Schrupp DOB: 1960-12-29 MRN: [id number]   Date of Service  12/06/2023  HPI/Events of Note  End-stage COPD, intubated, ongoing goals of care conversations.  Bedside staff patient had about soft blood pressures.  Borderline at MAP of 64.  eICU Interventions  Increase RASS goal to 0.  Limit Precedex  to 0.8 mcg, fentanyl to 200 mcg.  Add restraints.  If adjustment of sedation is ineffective, can consider vasopressors   0440 - initiate low dose NE  Intervention Category Intermediate Interventions: Hypotension - evaluation and management  Zamani Crocker 12/06/2023, 10:31 PM

## 2023-12-06 NOTE — Procedures (Signed)
 Cortrak  Person Inserting Tube:  Nickolette Espinola, Kerstin Peeling, RD Tube Type:  Cortrak - 43 inches Tube Size:  10 Tube Location:  Right nare Initial Placement:  Stomach Secured by: Bridle Technique Used to Measure Tube Placement:  Marking at nare/corner of mouth Cortrak Secured At:  63 cm   Cortrak Tube Team Note:  Consult received to place a Cortrak feeding tube.   No x-ray is required. RN may begin using tube.   If the tube becomes dislodged please keep the tube and contact the Cortrak team at [email or web address] for replacement.  If after hours and replacement cannot be delayed, place a NG tube and confirm placement with an abdominal x-ray.    Frederik Jansky, RD Registered Dietitian  See Amion for more information

## 2023-12-06 NOTE — Progress Notes (Signed)
 Patient received 2 mg of Morphine. Patient complaint of feeling shortness of breath uncharged.

## 2023-12-06 NOTE — Progress Notes (Signed)
 Initial Nutrition Assessment  DOCUMENTATION CODES:  Severe malnutrition in context of acute illness/injury  INTERVENTION:  If enteral access obtained and nutrition support initiated, recommend: Osmolite 1.5 at 50ml/hr (1200ml per day) *Initiate at 56ml/hr and advance by 10ml q10h to goal rate  60ml ProSource TF20 once daily  Provides 1800 kcal, 75g protein and free water daily  Recommend monitoring magnesium  and phosphorus every daily x 4 occurrences, MD to replete as needed, as pt is at risk for refeeding syndrome given inadequate oral intake >/=5 days.  If enteral nutrition initiated, add thiamine 100mg  daily x5 days  NUTRITION DIAGNOSIS:  Severe Malnutrition related to acute illness (acute COPD exacerbation) as evidenced by energy intake < or equal to 50% for > or equal to 5 days, severe muscle depletion.  GOAL:  Patient will meet greater than or equal to 90% of their needs  MONITOR:  Diet advancement, Labs, Weight trends  REASON FOR ASSESSMENT:  Consult Enteral/tube feeding initiation and management  ASSESSMENT:  Pt admitted with respiratory distress. Pt with 5 admissions since February d/t COPD exacerbation. PMH significant for afib on eliquis , COPD, chronic hypoxic respiratory failure, current smoker, HTN and DVT.   Checked in with pt at bedside. No family present at that time.  Pt remains on BiPAP and unable to provide any nutrition history.   Pt discussed in IDT rounds.  Pt remains hypercapnic.  Today is NPO day 5 and pt is high nutrition risk.  Awaiting further GOC conversation with family.  Pt currently on Cortrak list as she has no enteral access for nutrition or medications if needed.   No significant weight changes noted per limited documentation of weight history on file. Will continue to monitor.  Admit weight: 75.2 kg Current weight: 74 kg  P meets criteria for severe acute malnutrition likely exacerbated by acute COPD. It is likely that her  malnutrition is more chronic in nature however cannot confirm at this time based on limited information obtained, will reassess on follow up as able.   Medications: SSI 0-15 units q4h, solu-medrol , abx  Labs:  Sodium 147 BUN 34 CBG's 106-133 x24 hours  NUTRITION - FOCUSED PHYSICAL EXAM: Flowsheet Row Most Recent Value  Orbital Region Unable to assess  Upper Arm Region Mild depletion  Thoracic and Lumbar Region No depletion  Buccal Region Unable to assess  Temple Region Severe depletion  Clavicle Bone Region Mild depletion  Clavicle and Acromion Bone Region Mild depletion  Scapular Bone Region Unable to assess  Dorsal Hand Severe depletion  [UTA R hand d/t edema]  Patellar Region Severe depletion  Anterior Thigh Region Severe depletion  Posterior Calf Region Severe depletion  Edema (RD Assessment) Moderate  [generalized non-pitting]  Hair Reviewed  Eyes Unable to assess  Mouth Unable to assess  Skin Reviewed  Nails Reviewed   Diet Order:   Diet Order     None       EDUCATION NEEDS:   No education needs have been identified at this time  Skin:  Skin Assessment: Reviewed RN Assessment  Last BM:  6/1 type 7 smear  Height:  Ht Readings from Last 1 Encounters:  12/02/23 5\' 3"  (1.6 m)    Weight:  Wt Readings from Last 1 Encounters:  12/05/23 74 kg    BMI:  Body mass index is 28.9 kg/m.  Estimated Nutritional Needs:   Kcal:  1700-1900  Protein:  95-105g  Fluid:  >/=1.7L  Rocklin Chute, RDN, LDN Clinical Nutrition See AMiON for  contact information.

## 2023-12-06 NOTE — TOC Progression Note (Signed)
 Transition of Care [facility]) - Progression Note    Patient Details  Name: Yvette Booth MRN: [id number] Date of Birth: [birth year]  [facility] CM/SW Contact  Tom-Johnson, Annelisa Ryback Daphne, RN Phone Number: 12/06/2023, 1:00 PM  Clinical Narrative:     Patient continues on BiPAP 2/2 persistent Hypercapnia. On IV abx. . Patient not following command at this time. GOC continues with family.  Patient not Medically ready for discharge.  CM will continue to follow as patient progresses with care towards discharge.        Expected Discharge Plan and Services                                               Social Determinants of Health (SDOH) Interventions SDOH Screenings   Food Insecurity: No Food Insecurity (11/25/2023)  Housing: Low Risk  (11/25/2023)  Transportation Needs: No Transportation Needs (11/25/2023)  Utilities: Not At Risk (11/25/2023)  Financial Resource Strain: Low Risk  (12/16/2022)   Received from [facility]  Physical Activity: Not on File (10/23/2021)   Received from [facility]  Social Connections: Moderately Isolated (11/25/2023)  Stress: Not on File (10/23/2021)   Received from [facility]  Tobacco Use: High Risk (12/02/2023)    Readmission Risk Interventions    12/03/2023    1:11 PM 11/25/2023   11:55 AM 08/22/2023   10:27 AM  Readmission Risk Prevention Plan  Post Dischage Appt   Complete  Medication Screening   Complete  Transportation Screening Complete Complete Complete  PCP or Specialist Appt within 5-7 Days  Complete   PCP or Specialist Appt within 3-5 Days Complete    Home Care Screening  Complete   Medication Review (RN CM)  Complete   HRI or Home Care Consult Complete    Social Work Consult for Recovery Care Planning/Counseling Complete    Palliative Care Screening Not Applicable    Medication Review Oceanographer) Referral to Pharmacy

## 2023-12-06 NOTE — Progress Notes (Signed)
 Call x2 to Daughter phone ... [phone]. Daughter states getting too many calls so she is not answering any of them. Encouraged to "please answer your calls today!" Relayed that pt's status is changing and NP was trying to get additional information to help with patient care. She said she will try but no guarantees she will be able too. States her daughter's phone [phone]) is also good contact and they are together almost all the time. And confifmed  Brayson's phone # as "his phone works all the time" Relayed that NP would reach out again soon and daughter then stated she would be up in the hospital sometime after an hour from when we spoke ( 1415 hours)

## 2023-12-06 NOTE — Progress Notes (Addendum)
 NAME:  Yvette Booth, MRN:  [id number], DOB:  02-Jul-1961, LOS: 4 ADMISSION DATE:  12/02/2023, CONSULTATION DATE:  12/02/23 REFERRING MD:  Feliciana Horn, MD, CHIEF COMPLAINT:  Respiratory distress/COPD    History of Present Illness:  63 year old female with history of afib on eliquis , COPD, chronic hypoxic respiratory failure on 2L [place] at home, current smoker, HTN and DVT. Patient was recently admitted to [facility] one week ago for a COPD exacerbation sent home on prednisone , Diamox  and PRN albuterol  and has been admitted 5 times since February of this year for management of her COPD. She presented to the ED this afternoon with respiratory distress; initially restless and sitting in tripod position then became somnolent. Upon my assessment patient is unable to answer questions appropriately, difficult to arouse requiring repeat stimulation to open eyes, is requiring BiPAP; 15/6 50% with tidal volumes in the 200s.  Pertinent  Medical History  COPD, smoker, DVT, Afib on eliquis , CVA, OSA  Significant Hospital Events: Including procedures, antibiotic start and stop dates in addition to other pertinent events   5/29 admit to ICU; Bipap 5/31 remains on BiPAP with ongoing WOB, precedex  for restlessness  5/31 RVP panel positive for parainfluenza and rhinovirus 6/2 remains BiPAP dependent with persistent hypercapnia.  Off Precedex  drip  Interim History / Subjective:  Seen sitting up in bed on BiPAP, minimally arousable  Objective    Blood pressure (!) 135/104, pulse 95, temperature 97.9 F (36.6 C), temperature source Axillary, resp. rate (!) 37, height 5\' 3"  (1.6 m), weight 74 kg, SpO2 (!) 88%.    Vent Mode: BIPAP;PCV FiO2 (%):  [40 %] 40 % Set Rate:  [18 bmp] 18 bmp PEEP:  [5 cmH20] 5 cmH20 Pressure Support:  [18 cmH20] 18 cmH20   Intake/Output Summary (Last 24 hours) at 12/06/2023 0950 Last data filed at 12/06/2023 0700 Gross per 24 hour  Intake 1218.73 ml  Output 600 ml  Net 618.73 ml    Filed Weights   12/02/23 2028 12/03/23 2024 12/05/23 0305  Weight: 75.2 kg 77 kg 74 kg   Examination: General: Acute on chronic ill-appearing deconditioned middle-aged female lying in bed on BiPAP in no acute distress HEENT: BiPAP mask in place, MM pink/moist, PERRL,  Neuro: Arouses to verbal stimuli but unable to follow commands CV: s1s2 regular rate and rhythm, no murmur, rubs, or gallops,  PULM: Significantly diminished air entry bilaterally, no wheezing, mild increased work of breathing on BiPAP GI: soft, bowel sounds active in all 4 quadrants, non-tender, non-distended Extremities: warm/dry, no edema  Skin: no rashes or lesions   Resolved problem list   Assessment and Plan   Recurrent acute on chronic respiratory failure; hypercarbic and hypoxic; 2/2 AECOPD to rhinovirus/ parainfluenza  -Baseline CO2 in the 70-80s, ?90's P:  Continue BiPAP Remains on azithromycin  As needed benzodiazepine Continue IV Solu-Medrol  Nicotine  patch N.p.o. will need to discuss with family regarding nutrition versus goals of care Droplet precautions  Acute metabolic encephalopathy. Seems most likely 2/2 hypercarbia, improving P: Delirium precautions Minimize sedation as able Supportive care  PAF -Remains in sinus rhythm History of DVT P: Continuous telemetry Continue Lovenox  in the place of Eliquis  given n.p.o. Optimize electrolytes As needed IV Lopressor  while n.p.o.  Hyperthyroidism  - TSH ok P: Home Methimazole  while NPO   Hx CVA -Unclear what if any deficits she has baseline P: Neuroprotective measures Supportive care  Hypernatremia -Likely due to insensible losses, NPO for several days Protein calorie malnutrition  - will need  cortrak placement and free water replacement, water deficit , start D5W 50 ml/hr P: D5W at 50 started 6/1 given free water deficit, continue for now Possible Cortack placement if able given BIPAP dependence   Goals of care Patient  remains BiPAP dependent in the setting of end-stage COPD with multiple exacerbations over the last several months.  Currently we are struggling with being able to provide enteral nutrition as patient cannot come off BiPAP.  Will discuss with family regarding goals of care later today  Best Practice (right click and "Reselect all SmartList Selections" daily)   Diet/type: NPO DVT prophylaxis full dose lovenox  Pressure ulcer(s): N/A GI prophylaxis: PPI Lines: N/A Foley:  N/A Code Status:  full code  Pending update 6/1, no family at bedside.  Called daughter, Jessica> no answer, VM box full.   Critical care time:   CRITICAL CARE Performed by: Alfreida Steffenhagen D. Harris   Total critical care time: 38 minutes  Critical care time was exclusive of separately billable procedures and treating other patients.  Critical care was necessary to treat or prevent imminent or life-threatening deterioration.  Critical care was time spent personally by me on the following activities: development of treatment plan with patient and/or surrogate as well as nursing, discussions with consultants, evaluation of patient's response to treatment, examination of patient, obtaining history from patient or surrogate, ordering and performing treatments and interventions, ordering and review of laboratory studies, ordering and review of radiographic studies, pulse oximetry and re-evaluation of patient's condition.  Naisha Wisdom D. Harris, NP-C [facility] Personal contact information can be found on Amion  If no contact or response made please call [phone] 12/06/2023, 10:08 AM

## 2023-12-06 NOTE — Progress Notes (Signed)
 eLink Physician-Brief Progress Note Patient Name: Yvette Booth DOB: 03-04-1961 MRN: [id number]   Date of Service  12/06/2023  HPI/Events of Note  Patient with increased anxiety, can't get rest causing BP to go up now 161/81 (104) treated with Lopressor . Requesting anti-anxiety med. She has Precedex  ordered but BSRN instructed not to resume  Seen on BiPap, does appear anxious  eICU Interventions  Will give a trial of morphine 2 mg IV to aid with the sensation of dyspnea. Discussed with BSRN     Intervention Category Minor Interventions: Agitation / anxiety - evaluation and management  [name] 12/06/2023, 1:33 AM

## 2023-12-06 NOTE — IPAL (Signed)
  Interdisciplinary Goals of Care Family Meeting   Date carried out: 12/06/2023  Location of the meeting: Bedside  Member's involved: Nurse Practitioner and Family Member or next of kin  Durable Power of Attorney or acting medical decision maker: Sister Yvette Booth  POA  Discussion: Despite continuous BiPAP therapy patient continued to remain hypercapnic and somnolent prompting communication with family.  Collective decision was made to intubate to hopefully allow for clearance of hypercapnia to hopefully allow patient to participate in goals of care discussion  It was confirmed post intubation that patient's sister Yvette Booth holds POA (paperwork added to chart) and moving forward she will be the decision maker and primary contact.Yvette Booth   Postintubation I had a long discussion with Yvette Booth at bedside regarding goals of care.  I relayed my concerns that Yvette Booth is very likely to have avery poor quality of life moving forward given end-stage COPD and Yvette Booth confirms that Yvette Booth would not wanted a prolonged intubation or trach/trach. Will try and give a couple days on the vent to optimize patient but if little to no progress is made I encouraged Yvette Booth to consider transition to comfort care but Wednesday or Thursday.   Code status:   Code Status: Full Code   Disposition: Continue current acute care  Time spent for the meeting: 40 mins   Yvette Laver D. Harris, NP-C [facility] Personal contact information can be found on Amion  If no contact or response made please call [phone] 12/06/2023, 5:41 PM

## 2023-12-06 NOTE — Plan of Care (Signed)
  Problem: Skin Integrity: Goal: Risk for impaired skin integrity will decrease Outcome: Progressing   Problem: Tissue Perfusion: Goal: Adequacy of tissue perfusion will improve Outcome: Progressing   Problem: Clinical Measurements: Goal: Will remain free from infection Outcome: Progressing Goal: Cardiovascular complication will be avoided Outcome: Progressing   Problem: Pain Managment: Goal: General experience of comfort will improve and/or be controlled Outcome: Progressing   Problem: Safety: Goal: Ability to remain free from injury will improve Outcome: Progressing   Problem: Clinical Measurements: Goal: Diagnostic test results will improve (ABG) Outcome: Not Progressing Goal: Respiratory complications will improve Outcome: Not Progressing

## 2023-12-06 NOTE — Plan of Care (Signed)
  Problem: Nutritional: Goal: Maintenance of adequate nutrition will improve Outcome: Progressing Note: Patient for Cor track tube placement today

## 2023-12-07 DIAGNOSIS — G9341 Metabolic encephalopathy: Secondary | ICD-10-CM | POA: Diagnosis not present

## 2023-12-07 DIAGNOSIS — J9601 Acute respiratory failure with hypoxia: Secondary | ICD-10-CM

## 2023-12-07 DIAGNOSIS — J441 Chronic obstructive pulmonary disease with (acute) exacerbation: Secondary | ICD-10-CM | POA: Diagnosis not present

## 2023-12-07 DIAGNOSIS — J9622 Acute and chronic respiratory failure with hypercapnia: Secondary | ICD-10-CM | POA: Diagnosis not present

## 2023-12-07 DIAGNOSIS — J9621 Acute and chronic respiratory failure with hypoxia: Secondary | ICD-10-CM | POA: Diagnosis not present

## 2023-12-07 DIAGNOSIS — Z515 Encounter for palliative care: Secondary | ICD-10-CM

## 2023-12-07 LAB — GLUCOSE, CAPILLARY
Glucose-Capillary: 157 mg/dL — ABNORMAL HIGH (ref 70–99)
Glucose-Capillary: 167 mg/dL — ABNORMAL HIGH (ref 70–99)
Glucose-Capillary: 186 mg/dL — ABNORMAL HIGH (ref 70–99)
Glucose-Capillary: 197 mg/dL — ABNORMAL HIGH (ref 70–99)
Glucose-Capillary: 203 mg/dL — ABNORMAL HIGH (ref 70–99)
Glucose-Capillary: 235 mg/dL — ABNORMAL HIGH (ref 70–99)
Glucose-Capillary: 90 mg/dL (ref 70–99)

## 2023-12-07 LAB — POCT I-STAT 7, (LYTES, BLD GAS, ICA,H+H)
Acid-Base Excess: 13 mmol/L — ABNORMAL HIGH (ref 0.0–2.0)
Bicarbonate: 40.9 mmol/L — ABNORMAL HIGH (ref 20.0–28.0)
Calcium, Ion: 1.28 mmol/L (ref 1.15–1.40)
HCT: 33 % — ABNORMAL LOW (ref 36.0–46.0)
Hemoglobin: 11.2 g/dL — ABNORMAL LOW (ref 12.0–15.0)
O2 Saturation: 100 %
Patient temperature: 99.6
Potassium: 4.1 mmol/L (ref 3.5–5.1)
Sodium: 141 mmol/L (ref 135–145)
TCO2: 43 mmol/L — ABNORMAL HIGH (ref 22–32)
pCO2 arterial: 73.4 mmHg (ref 32–48)
pH, Arterial: 7.357 (ref 7.35–7.45)
pO2, Arterial: 352 mmHg — ABNORMAL HIGH (ref 83–108)

## 2023-12-07 LAB — RENAL FUNCTION PANEL
Albumin: 2.8 g/dL — ABNORMAL LOW (ref 3.5–5.0)
Anion gap: 10 (ref 5–15)
BUN: 40 mg/dL — ABNORMAL HIGH (ref 8–23)
CO2: 35 mmol/L — ABNORMAL HIGH (ref 22–32)
Calcium: 9.1 mg/dL (ref 8.9–10.3)
Chloride: 100 mmol/L (ref 98–111)
Creatinine, Ser: 0.97 mg/dL (ref 0.44–1.00)
GFR, Estimated: >60 mL/min (ref >60)
Glucose, Bld: 153 mg/dL — ABNORMAL HIGH (ref 70–99)
Phosphorus: 1.8 mg/dL — ABNORMAL LOW (ref 2.5–4.6)
Potassium: 4.4 mmol/L (ref 3.5–5.1)
Sodium: 145 mmol/L (ref 135–145)

## 2023-12-07 LAB — CBC
HCT: 42.4 % (ref 36.0–46.0)
Hemoglobin: 12.3 g/dL (ref 12.0–15.0)
MCH: 28.6 pg (ref 26.0–34.0)
MCHC: 29 g/dL — ABNORMAL LOW (ref 30.0–36.0)
MCV: 98.6 fL (ref 80.0–100.0)
Platelets: 137 10*3/uL — ABNORMAL LOW (ref 150–400)
RBC: 4.3 MIL/uL (ref 3.87–5.11)
RDW: 14.6 % (ref 11.5–15.5)
WBC: 10.7 10*3/uL — ABNORMAL HIGH (ref 4.0–10.5)
nRBC: 0 % (ref 0.0–0.2)

## 2023-12-07 LAB — PHOSPHORUS: Phosphorus: 1.4 mg/dL — ABNORMAL LOW (ref 2.5–4.6)

## 2023-12-07 LAB — MAGNESIUM: Magnesium: 2 mg/dL (ref 1.7–2.4)

## 2023-12-07 MED ORDER — THIAMINE MONONITRATE 100 MG PO TABS
100.0000 mg | ORAL_TABLET | Freq: Every day | ORAL | Status: DC
  Administered 2023-12-07 – 2023-12-10 (×4): 100 mg
  Filled 2023-12-07 (×4): qty 1

## 2023-12-07 MED ORDER — FREE WATER
100.0000 mL | Status: DC
  Administered 2023-12-07 – 2023-12-10 (×19): 100 mL

## 2023-12-07 MED ORDER — ORAL CARE MOUTH RINSE: 15.0000 mL | OROMUCOSAL | Status: DC | PRN

## 2023-12-07 MED ORDER — METHIMAZOLE 10 MG PO TABS
20.0000 mg | ORAL_TABLET | Freq: Every day | ORAL | Status: DC
  Administered 2023-12-07 – 2023-12-10 (×4): 20 mg
  Filled 2023-12-07 (×4): qty 2

## 2023-12-07 MED ORDER — ORAL CARE MOUTH RINSE
15.0000 mL | OROMUCOSAL | Status: DC
  Administered 2023-12-07 – 2023-12-10 (×38): 15 mL via OROMUCOSAL

## 2023-12-07 MED ORDER — NOREPINEPHRINE 4 MG/250ML-% IV SOLN
0.0000 ug/min | INTRAVENOUS | Status: DC
  Administered 2023-12-07: 2 ug/min via INTRAVENOUS
  Filled 2023-12-07: qty 250

## 2023-12-07 MED ORDER — ACETAZOLAMIDE SODIUM 500 MG IJ SOLR
500.0000 mg | Freq: Once | INTRAMUSCULAR | Status: AC
  Administered 2023-12-07: 500 mg via INTRAVENOUS
  Filled 2023-12-07: qty 500

## 2023-12-07 MED ORDER — OSMOLITE 1.5 CAL PO LIQD
1000.0000 mL | ORAL | Status: DC
  Administered 2023-12-07 – 2023-12-10 (×3): 1000 mL
  Filled 2023-12-07: qty 1000

## 2023-12-07 MED ORDER — SODIUM PHOSPHATES 45 MMOLE/15ML IV SOLN
30.0000 mmol | Freq: Once | INTRAVENOUS | Status: AC
  Administered 2023-12-07: 30 mmol via INTRAVENOUS
  Filled 2023-12-07: qty 10

## 2023-12-07 MED ORDER — SODIUM CHLORIDE 0.9 % IV SOLN: 250.0000 mL | INTRAVENOUS | Status: AC

## 2023-12-07 MED ORDER — PROSOURCE TF20 ENFIT COMPATIBL EN LIQD
60.0000 mL | Freq: Every day | ENTERAL | Status: DC
Start: 2023-12-07 — End: 2023-12-10
  Administered 2023-12-07 – 2023-12-10 (×4): 60 mL
  Filled 2023-12-07 (×4): qty 60

## 2023-12-07 MED ORDER — VITAL HIGH PROTEIN PO LIQD: 1000.0000 mL | ORAL | Status: DC

## 2023-12-07 MED ORDER — PREDNISONE 20 MG PO TABS
20.0000 mg | ORAL_TABLET | Freq: Every day | ORAL | Status: DC
  Administered 2023-12-08 – 2023-12-10 (×3): 20 mg
  Filled 2023-12-07 (×3): qty 1

## 2023-12-07 NOTE — Progress Notes (Signed)
 Nutrition Brief Note  Consult received for enteral/tube feeding initiation and management.  Full RD assessment completed 6/2.  Per CCM and family discussion, plan to trial intubation, if no progression to extubation will consider transition to comfort at that time.   Nutrition recommendations as below: Osmolite 1.5 at 65ml/hr ( per day) *Initiate at 78ml/hr and advance by 10ml q12h to goal rate  60ml ProSource TF20 once daily  Provides 1800 kcal, 75g protein and free water daily   Recommend monitoring magnesium  and phosphorus every daily x 4 occurrences, MD to replete as needed, as pt is at risk for refeeding syndrome given inadequate oral intake >/=5 days.   Add thiamine 100mg  daily x5 days  Refeeding labs: Sodium 145 (wdl) Potassium 4.4 (wdl) Mg 2.0 (wdl) Phosphorus 1.4, 1.8   Discussed repletion of phosphorus with Pharmacy.  Will plan for slow titration of TF to allow time for repletion infusion.  Pharmacy requesting free water flush recommendations d/t discontinuation of D5W.  Free water flushes 100ml q4h (provides ) Total free water daily (TF + FWF)- 1514ml   Allie Neriyah Cercone, RDN, LDN Clinical Nutrition See AM[facility]ON for contact information.

## 2023-12-07 NOTE — Progress Notes (Signed)
 eLink Physician-Brief Progress Note Patient Name: Yvette Booth DOB: [birth year] MRN: [id number]   Date of Service  12/07/2023  HPI/Events of Note  History of COPD, intubated, 3-day trial of intubation  Intermittent agitation, sedation in place  eICU Interventions  Renew restraints for patient safety     Intervention Category Minor Interventions: Agitation / anxiety - evaluation and management  Tatym Schermer 12/07/2023, 7:50 PM

## 2023-12-07 NOTE — Progress Notes (Signed)
 NAME:  Yvette Booth, MRN:  [id number], DOB:  10-28-1960, LOS: 5 ADMISSION DATE:  12/02/2023, CONSULTATION DATE:  12/02/23 REFERRING MD:  Feliciana Horn, MD, CHIEF COMPLAINT:  Respiratory distress/COPD    History of Present Illness:  63 year old female with history of afib on eliquis , COPD, chronic hypoxic respiratory failure on 2L [place] at home, current smoker, HTN and DVT. Patient was recently admitted to [facility] one week ago for a COPD exacerbation sent home on prednisone , Diamox  and PRN albuterol  and has been admitted 5 times since February of this year for management of her COPD. She presented to the ED this afternoon with respiratory distress; initially restless and sitting in tripod position then became somnolent. Upon my assessment patient is unable to answer questions appropriately, difficult to arouse requiring repeat stimulation to open eyes, is requiring BiPAP; 15/6 50% with tidal volumes in the 200s.  Pertinent  Medical History  COPD, smoker, DVT, Afib on eliquis , CVA, OSA  Significant Hospital Events: Including procedures, antibiotic start and stop dates in addition to other pertinent events   5/29 admit to ICU; Bipap 5/31 remains on BiPAP with ongoing WOB, precedex  for restlessness  5/31 RVP panel positive for parainfluenza and rhinovirus 6/2 remains BiPAP dependent with persistent hypercapnia.  Off Precedex  drip 6/2 intubated for trial of mechanical ventilation following conversation with the family.   Interim History / Subjective:   Intubated yesterday with improvement in CO2.  Some sedation related hypotension.   Objective    Blood pressure (!) 96/44, pulse 67, temperature 100.1 F (37.8 C), temperature source Axillary, resp. rate (!) 21, height 5\' 3"  (1.6 m), weight 74 kg, SpO2 91%.    Vent Mode: PRVC FiO2 (%):  [40 %-100 %] 50 % Set Rate:  [18 bmp-24 bmp] 20 bmp Vt Set:  [420 mL] 420 mL PEEP:  [5 cmH20-8 cmH20] 5 cmH20 Plateau Pressure:  [20 cmH20-29 cmH20] 20 cmH20    Intake/Output Summary (Last 24 hours) at 12/07/2023 0840 Last data filed at 12/07/2023 1443 Gross per 24 hour  Intake 2052.92 ml  Output 150 ml  Net 1902.92 ml   Filed Weights   12/02/23 2028 12/03/23 2024 12/05/23 0305  Weight: 75.2 kg 77 kg 74 kg   Examination: General: Acute on chronic ill-appearing deconditioned middle-aged female lying in bed HEENT: ETT in place, Cortrak in place. Neuro: On sedation, no response to painful stimulation.  CV:  HS normal. Hands cool PULM: Synchronous with ventilator with acceptable airway pressures. No evidence of gas trapping. ++ wheezing.  GI: Soft.  Extremities: warm/dry, no edema  Skin: no rashes or lesions  Ancillary Tests Personally Reviewed:  ABG now shows compensated hypercarbia.   Assessment and Plan   Recurrent acute on chronic respiratory failure; hypercarbic and hypoxic; 2/2 AECOPD to rhinovirus/ parainfluenza  Acute metabolic encephalopathy due to hypercarbia PAF by history currently in SR History of DVT Hyperthyroidism  Hx CVA Protein calorie malnutrition   Plan:  - continue current mechanical ventilation. Follow pCO2 and start SBT once pCO2 normalizes. - Diamox  - Continue current sedation, target RASS -2.  - Continue current bronchodilators - Continue IV and inhaled steroids.  Continue azithromycin  for 5 days.  - Time limited trial of intubation: will start with an initial 3 days of stabilization - if looks like patient is not progressing to extubation at that point, will consider transition to comfort care.   Best Practice (right click and "Reselect all SmartList Selections" daily)   Diet/type: NPO - start tube  feeds.  DVT prophylaxis full dose lovenox  Pressure ulcer(s): N/A GI prophylaxis: PPI Lines: N/A Foley:  N/A Code Status:  full code   CRITICAL CARE Performed by: [name]   Total critical care time: 45 minutes  Critical care time was exclusive of separately billable procedures and treating  other patients.  Critical care was necessary to treat or prevent imminent or life-threatening deterioration.  Critical care was time spent personally by me on the following activities: development of treatment plan with patient and/or surrogate as well as nursing, discussions with consultants, evaluation of patient's response to treatment, examination of patient, obtaining history from patient or surrogate, ordering and performing treatments and interventions, ordering and review of laboratory studies, ordering and review of radiographic studies, pulse oximetry, re-evaluation of patient's condition and participation in multidisciplinary rounds.  [name], MD [facility] ICU Physician [facility] [place] Critical Care  Pager: [phone] Mobile: [phone] After hours: [phone].   12/07/2023, 8:40 AM

## 2023-12-07 NOTE — Plan of Care (Signed)
  Problem: Safety: Goal: Non-violent Restraint(s) Outcome: Progressing   

## 2023-12-07 NOTE — Plan of Care (Signed)
     Referral previously received for Yvette Booth for goals of care discussion. Noted most recent palliative note dated 12/06/2023 at which time it was recommended to follow from a distance/chart check for allowing time for outcomes with elevation to try to clear CO2 and have a goals of care conversation with the patient.  If patient unable to clear CO2 then would need a goals of care conversation with the family.  Sister Yvette Booth holds [employer] documentation and would be Agricultural consultant if needed.  Chart reviewed for Recent provider notes, vitals, and labs and updates received from RN.   At this time patient appears stable, soft blood pressures likely related to sedation and appropriately addressed.  At this point planning for 3-day trial of intubation as family was clear she would not want prolonged intubation/trach.  If no improvement at that point PCCM recommends consider transition to comfort care.  No plan for in person follow-up today.  I have contacted PCCM Dr. Marce Sensing with plan to follow-up with family tomorrow and as needed after that for ongoing GOC conversations.    Please contact the palliative medicine provider on service for any new/urgent needs that require our assistance with this patient.  Thank you for your referral and allowing PMT to assist in Yvette Booth's care.   Lizbeth Right, NP Palliative Medicine Team Phone: [phone]  NO CHARGE

## 2023-12-08 DIAGNOSIS — E43 Unspecified severe protein-calorie malnutrition: Secondary | ICD-10-CM

## 2023-12-08 DIAGNOSIS — Z515 Encounter for palliative care: Secondary | ICD-10-CM | POA: Diagnosis not present

## 2023-12-08 DIAGNOSIS — Z7189 Other specified counseling: Secondary | ICD-10-CM | POA: Diagnosis not present

## 2023-12-08 DIAGNOSIS — J9601 Acute respiratory failure with hypoxia: Secondary | ICD-10-CM | POA: Diagnosis not present

## 2023-12-08 LAB — POCT I-STAT 7, (LYTES, BLD GAS, ICA,H+H)
Acid-Base Excess: 11 mmol/L — ABNORMAL HIGH (ref 0.0–2.0)
Bicarbonate: 37.3 mmol/L — ABNORMAL HIGH (ref 20.0–28.0)
Calcium, Ion: 1.24 mmol/L (ref 1.15–1.40)
HCT: 31 % — ABNORMAL LOW (ref 36.0–46.0)
Hemoglobin: 10.5 g/dL — ABNORMAL LOW (ref 12.0–15.0)
O2 Saturation: 89 %
Patient temperature: 99.2
Potassium: 3.7 mmol/L (ref 3.5–5.1)
Sodium: 141 mmol/L (ref 135–145)
TCO2: 39 mmol/L — ABNORMAL HIGH (ref 22–32)
pCO2 arterial: 58.5 mmHg — ABNORMAL HIGH (ref 32–48)
pH, Arterial: 7.414 (ref 7.35–7.45)
pO2, Arterial: 59 mmHg — ABNORMAL LOW (ref 83–108)

## 2023-12-08 LAB — RENAL FUNCTION PANEL
Albumin: 2.3 g/dL — ABNORMAL LOW (ref 3.5–5.0)
Anion gap: 10 (ref 5–15)
BUN: 47 mg/dL — ABNORMAL HIGH (ref 8–23)
CO2: 32 mmol/L (ref 22–32)
Calcium: 8.8 mg/dL — ABNORMAL LOW (ref 8.9–10.3)
Chloride: 100 mmol/L (ref 98–111)
Creatinine, Ser: 1.03 mg/dL — ABNORMAL HIGH (ref 0.44–1.00)
GFR, Estimated: >60 mL/min (ref >60)
Glucose, Bld: 107 mg/dL — ABNORMAL HIGH (ref 70–99)
Phosphorus: 2.6 mg/dL (ref 2.5–4.6)
Potassium: 3.7 mmol/L (ref 3.5–5.1)
Sodium: 142 mmol/L (ref 135–145)

## 2023-12-08 LAB — PHOSPHORUS: Phosphorus: 2.6 mg/dL (ref 2.5–4.6)

## 2023-12-08 LAB — GLUCOSE, CAPILLARY
Glucose-Capillary: 142 mg/dL — ABNORMAL HIGH (ref 70–99)
Glucose-Capillary: 147 mg/dL — ABNORMAL HIGH (ref 70–99)
Glucose-Capillary: 164 mg/dL — ABNORMAL HIGH (ref 70–99)
Glucose-Capillary: 182 mg/dL — ABNORMAL HIGH (ref 70–99)
Glucose-Capillary: 214 mg/dL — ABNORMAL HIGH (ref 70–99)
Glucose-Capillary: 96 mg/dL (ref 70–99)

## 2023-12-08 LAB — MAGNESIUM: Magnesium: 2 mg/dL (ref 1.7–2.4)

## 2023-12-08 MED ORDER — ACETAMINOPHEN 325 MG PO TABS
650.0000 mg | ORAL_TABLET | Freq: Four times a day (QID) | ORAL | Status: DC | PRN
  Administered 2023-12-08 – 2023-12-10 (×2): 650 mg
  Filled 2023-12-08 (×2): qty 2

## 2023-12-08 NOTE — Progress Notes (Signed)
  Daily Progress Note   Date: 12/08/2023   Patient Name: Yvette Booth  DOB: 1961-06-15  MRN: [id number]  Age / Sex: 62 y.o., female  Attending Physician: [name], MD Primary Care Physician[facility] Admit Date: 12/02/2023 Length of Stay: 6 days  Reason for Consultation: {Reason for Consult:23484}  Past Medical History:  Diagnosis Date   A-fib [facility])    Chronic hypercapnic respiratory failure (HCC)    BIPAP at night   Chronic hypoxic respiratory failure (HCC)    COPD (chronic obstructive pulmonary disease) (HCC)    DVT (deep venous thrombosis) (HCC)    Hypertension    Hyperthyroidism    Ischemic stroke (HCC)    OSA (obstructive sleep apnea)    RVF (right ventricular failure) (HCC)     Subjective:   Subjective: Chart Reviewed. Updates received. Patient Assessed. Created space and opportunity for patient  and family to explore thoughts and feelings regarding current medical situation.  Today's Discussion: Today before meeting with the patient/family, I reviewed the chart including ***. ***  Review of Systems  Objective:   Primary Diagnoses: Present on Admission:  Acute respiratory failure with hypoxia (HCC)  Acute on chronic respiratory failure (HCC)  COPD exacerbation (HCC)   Vital Signs:  BP (!) 126/52   Pulse 72   Temp 99.3 F (37.4 C) (Axillary)   Resp 16   Ht 5\' 3"  (1.6 m)   Wt 78.2 kg   SpO2 97%   BMI 30.54 kg/m   Physical Exam  Palliative Assessment/Data: ***   Existing Vynca/ACP Documentation: ***  Advanced Care Planning:   Existing Vynca/ACP Documentation: ***  Primary Decision Maker: {Primary Decision ONGEX:52841}  Pertinent diagnosis: ***  The patient and/or family consented to a voluntary Advance Care Planning Conversation in person/over the phone***. Individuals present for the conversation:  Summary of the conversation: ***  Outcome of the conversations and/or documents completed: ***  I spent  *** minutes providing separately identifiable ACP services with the patient and/or surrogate decision maker in a voluntary, in-person conversation discussing the patient's wishes and goals as detailed in the above note.  Assessment & Plan:   HPI/Patient Profile:  ***  SUMMARY OF RECOMMENDATIONS   ***  Symptom Management:  ***  Code Status: {Palliative Code status:23503}  Prognosis: {Palliative Care Prognosis:23504}  Discharge Planning: {Palliative dispostion:23505}  Discussed with: ***  Thank you for allowing us  to participate in the care of Yvette Booth PMT will continue to support holistically.  Time Total: ***  Detailed review of medical records (labs, imaging, vital signs), medically appropriate exam, discussed with treatment team, counseling and education to patient, family, & staff, documenting clinical information, medication management, coordination of care  Lizbeth Right, NP Palliative Medicine Team  Team Phone # [phone] (Nights/Weekends)  03/04/2021, 8:17 AM

## 2023-12-08 NOTE — Progress Notes (Signed)
 eLink Physician-Brief Progress Note Patient Name: Yvette Booth DOB: [birth year] MRN: [id number]   Date of Service  12/08/2023  HPI/Events of Note   pt is febrile, 101.3  eICU Interventions  prn tylenol  order per tube Blood culture   0352 -Add standard retention protocol.  In-N-Out cath as needed.  Intervention Category Minor Interventions: Routine modifications to care plan (e.g. PRN medications for pain, fever)  Shameika Speelman 12/08/2023, 7:41 PM

## 2023-12-09 DIAGNOSIS — Z7189 Other specified counseling: Secondary | ICD-10-CM | POA: Diagnosis not present

## 2023-12-09 DIAGNOSIS — Z515 Encounter for palliative care: Secondary | ICD-10-CM | POA: Diagnosis not present

## 2023-12-09 DIAGNOSIS — G9341 Metabolic encephalopathy: Secondary | ICD-10-CM | POA: Diagnosis not present

## 2023-12-09 DIAGNOSIS — J9622 Acute and chronic respiratory failure with hypercapnia: Secondary | ICD-10-CM | POA: Diagnosis not present

## 2023-12-09 DIAGNOSIS — J9601 Acute respiratory failure with hypoxia: Secondary | ICD-10-CM | POA: Diagnosis not present

## 2023-12-09 DIAGNOSIS — J441 Chronic obstructive pulmonary disease with (acute) exacerbation: Secondary | ICD-10-CM | POA: Diagnosis not present

## 2023-12-09 DIAGNOSIS — J9621 Acute and chronic respiratory failure with hypoxia: Secondary | ICD-10-CM | POA: Diagnosis not present

## 2023-12-09 LAB — BLOOD CULTURE ID PANEL (REFLEXED) - BCID2

## 2023-12-09 LAB — BASIC METABOLIC PANEL WITH GFR
Anion gap: 8 (ref 5–15)
BUN: 38 mg/dL — ABNORMAL HIGH (ref 8–23)
CO2: 39 mmol/L — ABNORMAL HIGH (ref 22–32)
Calcium: 8.7 mg/dL — ABNORMAL LOW (ref 8.9–10.3)
Chloride: 99 mmol/L (ref 98–111)
Creatinine, Ser: 0.9 mg/dL (ref 0.44–1.00)
GFR, Estimated: >60 mL/min (ref >60)
Glucose, Bld: 271 mg/dL — ABNORMAL HIGH (ref 70–99)
Potassium: 3.7 mmol/L (ref 3.5–5.1)
Sodium: 146 mmol/L — ABNORMAL HIGH (ref 135–145)

## 2023-12-09 LAB — GLUCOSE, CAPILLARY
Glucose-Capillary: 168 mg/dL — ABNORMAL HIGH (ref 70–99)
Glucose-Capillary: 177 mg/dL — ABNORMAL HIGH (ref 70–99)
Glucose-Capillary: 194 mg/dL — ABNORMAL HIGH (ref 70–99)
Glucose-Capillary: 209 mg/dL — ABNORMAL HIGH (ref 70–99)
Glucose-Capillary: 217 mg/dL — ABNORMAL HIGH (ref 70–99)
Glucose-Capillary: 232 mg/dL — ABNORMAL HIGH (ref 70–99)

## 2023-12-09 LAB — BRAIN NATRIURETIC PEPTIDE: B Natriuretic Peptide: 228.3 pg/mL — ABNORMAL HIGH (ref 0.0–100.0)

## 2023-12-09 MED ORDER — VANCOMYCIN HCL 500 MG/100ML IV SOLN
500.0000 mg | Freq: Two times a day (BID) | INTRAVENOUS | Status: DC
  Administered 2023-12-10: 500 mg via INTRAVENOUS
  Filled 2023-12-09 (×2): qty 100

## 2023-12-09 MED ORDER — FUROSEMIDE 10 MG/ML IJ SOLN
40.0000 mg | Freq: Once | INTRAMUSCULAR | Status: AC
  Administered 2023-12-09: 40 mg via INTRAVENOUS
  Filled 2023-12-09: qty 4

## 2023-12-09 MED ORDER — VANCOMYCIN HCL 1750 MG/350ML IV SOLN
1750.0000 mg | Freq: Once | INTRAVENOUS | Status: AC
  Administered 2023-12-10: 1750 mg via INTRAVENOUS
  Filled 2023-12-09: qty 350

## 2023-12-09 NOTE — Progress Notes (Signed)
 Pharmacy Antibiotic Note  Yvette Booth is a 63 y.o. female admitted on 12/02/2023 with respiratory distress .  Pharmacy has been consulted for vancomycin dosing.  Plan: vancomycin 1750mg  IV x1 then 500mg  IV q12 hours (eAUC 530, sCr 0.9, Vd 0.5) Monitor clinical progression and order levels as appropriate  Height: 5\' 3"  (160 cm) Weight: 78.2 kg (172 lb 6.4 oz) IBW/kg (Calculated) : 52.4  Temp (24hrs), Avg:99.8 F (37.7 C), Min:98.8 F (37.1 C), Max:101.2 F (38.4 C)  Recent Labs  Lab 12/03/23 0303 12/04/23 0311 12/05/23 0302 12/06/23 0425 12/07/23 0454 12/08/23 0403 12/09/23 1501  WBC 9.3 6.2 6.5  --  10.7*  --   --   CREATININE 0.86 0.66 0.74 0.72 0.97 1.03* 0.90    Estimated Creatinine Clearance: 63.3 mL/min (by C-G formula based on SCr of 0.9 mg/dL).    Allergies  Allergen Reactions   Seroquel [Quetiapine] Other (See Comments)    Delirium and confusion     Antimicrobials this admission: Vancomycin 6/5>>   Microbiology results: 6/4 BCx: Methicillin (oxacillin) resistant coagulase negative staphylococcus  5/29 MRSA PCR: +  Thank you for allowing pharmacy to be a part of this patient's care.  Angelo Kennedy Terrea Bruster 12/09/2023 10:36 PM

## 2023-12-09 NOTE — Progress Notes (Signed)
 NAME:  Yvette Booth, MRN:  [id number], DOB:  29-May-1961, LOS: 7 ADMISSION DATE:  12/02/2023, CONSULTATION DATE:  12/02/23 REFERRING MD:  Feliciana Horn, MD, CHIEF COMPLAINT:  Respiratory distress/COPD    History of Present Illness:  63 year old female with history of afib on eliquis , COPD, chronic hypoxic respiratory failure on 2L [place] at home, current smoker, HTN and DVT. Patient was recently admitted to [facility] one week ago for a COPD exacerbation sent home on prednisone , Diamox  and PRN albuterol  and has been admitted 5 times since February of this year for management of her COPD. She presented to the ED this afternoon with respiratory distress; initially restless and sitting in tripod position then became somnolent. Upon my assessment patient is unable to answer questions appropriately, difficult to arouse requiring repeat stimulation to open eyes, is requiring BiPAP; 15/6 50% with tidal volumes in the 200s.  Pertinent  Medical History  COPD, smoker, DVT, Afib on eliquis , CVA, OSA  Significant Hospital Events: Including procedures, antibiotic start and stop dates in addition to other pertinent events   5/29 admit to ICU; Bipap 5/31 remains on BiPAP with ongoing WOB, precedex  for restlessness  5/31 RVP panel positive for parainfluenza and rhinovirus 6/2 remains BiPAP dependent with persistent hypercapnia.  Off Precedex  drip 6/2 intubated for trial of mechanical ventilation following conversation with the family.   Interim History / Subjective:   Not tolerating any switch to pressure support.    Objective    Blood pressure 119/61, pulse 74, temperature (!) 100.6 F (38.1 C), temperature source Oral, resp. rate (!) 21, height 5\' 3"  (1.6 m), weight 78.2 kg, SpO2 94%.    Vent Mode: PRVC FiO2 (%):  [40 %] 40 % Set Rate:  [16 bmp-20 bmp] 16 bmp Vt Set:  [420 mL] 420 mL PEEP:  [5 cmH20] 5 cmH20 Plateau Pressure:  [18 cmH20-24 cmH20] 18 cmH20   Intake/Output Summary (Last 24 hours) at  12/09/2023 0824 Last data filed at 12/09/2023 0600 Gross per 24 hour  Intake 1362.39 ml  Output 905 ml  Net 457.39 ml   Filed Weights   12/03/23 2024 12/05/23 0305 12/08/23 0427  Weight: 77 kg 74 kg 78.2 kg   Examination: General: Acute on chronic ill-appearing deconditioned middle-aged female lying in bed HEENT: ETT in place, Cortrak in place. Neuro: On sedation, eyes open, responds to pain but not following commands.  CV:  HS normal.  PULM: Still ++ wheezing. Takes very small tidal volumes on PSV.  GI: Soft.  Extremities: warm/dry, no edema  Skin: no rashes or lesions  Ancillary Tests Personally Reviewed:  ABG now shows compensated hypercarbia.   Assessment and Plan   Recurrent acute on chronic respiratory failure; hypercarbic and hypoxic; 2/2 AECOPD to rhinovirus/ parainfluenza  Acute metabolic encephalopathy due to hypercarbia PAF by history currently in SR History of DVT Hyperthyroidism  Hx CVA Protein calorie malnutrition   Plan:  - Still not close to tolerating pressure support. Patient is likely to require prolonged ventilation.  - Diurese today.  - Wean sedation, RASS goal 0 - Continue current bronchodilators - Continue IV and inhaled steroids.  Continue azithromycin  for 5 days.  - Time limited trial of intubation: we are close to our 3 day reassessment point and she is not showing signs of prompt improvement. Will discuss goals with family further.  Best Practice (right click and "Reselect all SmartList Selections" daily)   Diet/type: NPO - start tube feeds.  DVT prophylaxis full dose lovenox  Pressure  ulcer(s): N/A GI prophylaxis: PPI Lines: N/A Foley:  N/A Code Status:  full code   CRITICAL CARE Performed by: [name]   Total critical care time: 45 minutes  Critical care time was exclusive of separately billable procedures and treating other patients.  Critical care was necessary to treat or prevent imminent or life-threatening  deterioration.  Critical care was time spent personally by me on the following activities: development of treatment plan with patient and/or surrogate as well as nursing, discussions with consultants, evaluation of patient's response to treatment, examination of patient, obtaining history from patient or surrogate, ordering and performing treatments and interventions, ordering and review of laboratory studies, ordering and review of radiographic studies, pulse oximetry, re-evaluation of patient's condition and participation in multidisciplinary rounds.  [name], MD [facility] ICU Physician [facility]  Critical Care  Pager: [phone] Mobile: [phone] After hours: [phone].   12/09/2023, 8:24 AM

## 2023-12-09 NOTE — Progress Notes (Addendum)
 Addendum: Lab called back about and reported a second set of positive cultures. Spoke to [facility] provider and decided to start vancomycin.   PHARMACY - PHYSICIAN COMMUNICATION CRITICAL VALUE ALERT - BLOOD CULTURE IDENTIFICATION (BCID)  Yvette Booth is an 63 y.o. female who presented to [facility] on 12/02/2023 with a chief complaint of respiratory distress   Assessment: Staphylococcus species 1 of 4 bottles - likely contaminant  Name of physician (or Provider) Contacted: Agarwala  Current antibiotics: azithromycin   Changes to prescribed antibiotics recommended:  No treatment needed at this time  Results for orders placed or performed during the hospital encounter of 12/02/23  Blood Culture ID Panel (Reflexed) (Collected: 12/08/2023  8:32 PM)  Result Value Ref Range   Enterococcus faecalis NOT DETECTED NOT DETECTED   Enterococcus Faecium NOT DETECTED NOT DETECTED   Listeria monocytogenes NOT DETECTED NOT DETECTED   Staphylococcus species DETECTED (A) NOT DETECTED   Staphylococcus aureus (BCID) NOT DETECTED NOT DETECTED   Staphylococcus epidermidis DETECTED (A) NOT DETECTED   Staphylococcus lugdunensis NOT DETECTED NOT DETECTED   Streptococcus species NOT DETECTED NOT DETECTED   Streptococcus agalactiae NOT DETECTED NOT DETECTED   Streptococcus pneumoniae NOT DETECTED NOT DETECTED   Streptococcus pyogenes NOT DETECTED NOT DETECTED   A.calcoaceticus-baumannii NOT DETECTED NOT DETECTED   Bacteroides fragilis NOT DETECTED NOT DETECTED   Enterobacterales NOT DETECTED NOT DETECTED   Enterobacter cloacae complex NOT DETECTED NOT DETECTED   Escherichia coli NOT DETECTED NOT DETECTED   Klebsiella aerogenes NOT DETECTED NOT DETECTED   Klebsiella oxytoca NOT DETECTED NOT DETECTED   Klebsiella pneumoniae NOT DETECTED NOT DETECTED   Proteus species NOT DETECTED NOT DETECTED   Salmonella species NOT DETECTED NOT DETECTED   Serratia marcescens NOT DETECTED NOT DETECTED   Haemophilus influenzae NOT  DETECTED NOT DETECTED   Neisseria meningitidis NOT DETECTED NOT DETECTED   Pseudomonas aeruginosa NOT DETECTED NOT DETECTED   Stenotrophomonas maltophilia NOT DETECTED NOT DETECTED   Candida albicans NOT DETECTED NOT DETECTED   Candida auris NOT DETECTED NOT DETECTED   Candida glabrata NOT DETECTED NOT DETECTED   Candida krusei NOT DETECTED NOT DETECTED   Candida parapsilosis NOT DETECTED NOT DETECTED   Candida tropicalis NOT DETECTED NOT DETECTED   Cryptococcus neoformans/gattii NOT DETECTED NOT DETECTED   Methicillin resistance mecA/C DETECTED (A) NOT DETECTED    [name] 12/09/2023  6:34 PM

## 2023-12-09 NOTE — TOC Progression Note (Signed)
 Transition of Care [facility] - Progression Note    Patient Details  Name: Yvette Booth MRN: [id number] Date of Birth: 1961/06/26  [facility] of [facility]) CM/SW Contact  Tom-Johnson, Angelique Ken, RN Phone Number: 12/09/2023, 2:23 PM  Clinical Narrative:      Patient intubated on 12/09/23 for Respiratory Distress. Patient still not tolerating pressure support. Diurese today, on IV abx, Neb tx and inhalers. Palliative following for GOC with family.  Patient not Medically ready for discharge.  CM will continue to follow as patient progresses with care towards discharge.         Expected Discharge Plan and Services                                               Social Determinants of Health (SDOH) Interventions SDOH Screenings   Food Insecurity: No Food Insecurity (11/25/2023)  Housing: Low Risk  (11/25/2023)  Transportation Needs: No Transportation Needs (11/25/2023)  Utilities: Not At Risk (11/25/2023)  Financial Resource Strain: Low Risk  (12/16/2022)   Received from [facility]  Physical Activity: Not on File (10/23/2021)   Received from [facility]  Social Connections: Moderately Isolated (11/25/2023)  Stress: Not on File (10/23/2021)   Received from [facility]  Tobacco Use: High Risk (12/02/2023)    Readmission Risk Interventions    12/03/2023    1:11 PM 11/25/2023   11:55 AM 08/22/2023   10:27 AM  Readmission Risk Prevention Plan  Post Dischage Appt   Complete  Medication Screening   Complete  Transportation Screening Complete Complete Complete  PCP or Specialist Appt within 5-7 Days  Complete   PCP or Specialist Appt within 3-5 Days Complete    Home Care Screening  Complete   Medication Review (RN CM)  Complete   HRI or Home Care Consult Complete    Social Work Consult for Recovery Care Planning/Counseling Complete    Palliative Care Screening Not Applicable    Medication Review Oceanographer) Referral to Pharmacy

## 2023-12-09 NOTE — Progress Notes (Signed)
 Daily Progress Note   Date: 12/09/2023   Patient Name: Yvette Booth  DOB: 1961-05-14  MRN: [id number]  Age / Sex: 63 y.o., female  Attending Physician: [name], MD Primary Care Physician: [facility] Admit Date: 12/02/2023 Length of Stay: 7 days  Reason for Consultation: Establishing goals of care  Past Medical History:  Diagnosis Date   A-fib [facility])    Chronic hypercapnic respiratory failure (HCC)    BIPAP at night   Chronic hypoxic respiratory failure (HCC)    COPD (chronic obstructive pulmonary disease) (HCC)    DVT (deep venous thrombosis) (HCC)    Hypertension    Hyperthyroidism    Ischemic stroke (HCC)    OSA (obstructive sleep apnea)    RVF (right ventricular failure) (HCC)     Subjective:   Subjective: Chart Reviewed. Updates received. Patient Assessed. Created space and opportunity for patient  and family to explore thoughts and feelings regarding current medical situation.  Today's Discussion: Today before meeting with the patient/family, I reviewed the chart including PCCM note from today as well as nursing flowsheets, assessments, vitals.  I reviewed labs today including BNP of 228, BNP with mild hyponatremia at 146, normal/stable kidney function.  Prior to seeing the patient I spoke with Dr. Marce Sensing from pulmonary critical care medicine regarding his note indicating we are at the 72-hour mark and now with compensated hypercarbia per ABG but still not close to tolerating pressure support/weaning, likely prolonged vent which per patient family discussions previously she would not want.  I shared that I was at the bedside and going to engage family in goals of care discussion and and he expressed that he would be up to participate as well.  Today we went to the bedside to see the patient and she is intubated, sedated, resting peacefully and unable to engage in meaningful conversation.  She did have significant coughing and noted cuff leak  during our conversation.  Also present at the bedside was the patient's 3 sisters including [name] who is HCPOA.  We spent a good amount of time talking about her progress and prognosis.  Dr. Marce Sensing shared that at the 72-hour mark her CO2 is improving and she is triggering breast, but she is taking tiny breasts and when they try pressure support she gets quite tachypneic and uncomfortable.  Sister shares that prior to admission she was struggling to breathe, required oxygen, had a poor quality of life.  Dr. Alana Hoyle shared that he miraculous situation would be getting back to that state with a poor quality of life.  More likely would be much worse off and potentially suffering.  After acute viral infections, which are technically "reversible" her lungs would be worse off and they were already bad.  After substantial discussion back-and-forth, keeping the patient's wishes at the center of the conversation based on conversations with Gracy Law, the decision was made that we would continue current scope of care, allow time for other family members to visit, anticipate transition to full comfort care with compassionate extubation in the next 24 to 48 hours.  Should the palliative medicine is available to participate with this either tomorrow or over the weekend.  Timing completely deferred to patient's family.  They requested if we could all meet again tomorrow around the same time (2:00) and either make the decision at that time to transition to comfort care, or get an idea about when that would be.  I shared that I would have somebody from  my team to follow-up tomorrow at 2:00.  I shared the palliative medicine continue to follow and support.  I ensured they had our contact number for any questions or concerns.  I provided emotional and general support through therapeutic listening, empathy, sharing of stories, therapeutic touch, and other techniques. I answered all questions and addressed all concerns to the best  of my ability.  Review of Systems  Unable to perform ROS: Intubated    Objective:   Primary Diagnoses: Present on Admission:  Acute respiratory failure with hypoxia (HCC)  Acute on chronic respiratory failure (HCC)  COPD exacerbation (HCC)   Vital Signs:  BP 138/64 (BP Location: Left Arm)   Pulse (!) 102   Temp (!) 101.2 F (38.4 C) (Oral)   Resp 16   Ht 5\' 3"  (1.6 m)   Wt 78.2 kg   SpO2 91%   BMI 30.54 kg/m   Physical Exam Vitals and nursing note reviewed.  Constitutional:      General: She is sleeping. She is not in acute distress.    Appearance: She is ill-appearing.     Interventions: She is sedated and intubated.  HENT:     Head: Normocephalic and atraumatic.  Cardiovascular:     Rate and Rhythm: Normal rate.  Pulmonary:     Effort: Pulmonary effort is normal. No respiratory distress. She is intubated.  Abdominal:     General: Abdomen is flat.  Skin:    General: Skin is warm and dry.     Palliative Assessment/Data: 10%   Advanced Care Planning:   Existing Vynca/ACP Documentation: Advance directive signed 11/30/2022  Primary Decision Maker: HCPOA  Pertinent diagnosis: Severe COPD, acute respiratory failure with hypoxia requiring intubation  The patient and/or family consented to a voluntary Advance Care Planning Conversation in person. Individuals present for the conversation: This NP, Sister [name] (POA), Sister Stephenie Einstein, Sister Maureen Sour Dr. Marce Sensing from PCCM  Summary of the conversation: Discussed her progress to date, the fact that she is not tolerating weaning/pressure support, and if we continue full scope of care it would be a prolonged intubation and very possibly require tracheostomy.  We shared prognosis that she could potentially require 24-hour care, and would have a quality of life that may not be acceptable to her based on sister's descriptions of her wishes.  We discussed the other alternative which would be transition to comfort care. I  explained comfort care as care where the patient would no longer receive aggressive medical interventions such as continuous vital signs, lab work, radiology testing, or medications not focused on comfort, peace, and dignity. This includes stopping antibiotics and weaning oxygen to room air, as these are generally not accepted as providing comfort but only prolonging the dying process artificially. All care would focus on how the patient is looking and feeling. This would include management of any symptoms that may cause discomfort, pain, shortness of breath/air hunger, increased work of breathing, cough, nausea, agitation/restlessness, anxiety, and/or secretions etc. Symptoms would be managed with medications and other non-pharmacological interventions such as spiritual support if requested, repositioning, music therapy, or therapeutic listening. Family verbalized understanding and agreement.   Outcome of the conversations and/or documents completed: Sisters anticipated transition to comfort care, but would like 24 to 48 hours for other family/friends to come visit including the patient's mother.  They would like to meet again tomorrow around 2:00 to either transition to comfort care at that time or confirm a date/time (possibly Saturday morning) to make that transition.  I spent 30 minutes providing separately identifiable ACP services with the patient and/or surrogate decision maker in a voluntary, in-person conversation discussing the patient's wishes and goals as detailed in the above note.  Assessment & Plan:   HPI/Patient Profile:  63 y.o. female  with past medical history of COPD (3L nasal cannula at baseline), RV failure, OSA (BiPAP nightly), PAF (Eliquis ), history of CVA, hyperthyroidism, HTN, DVT, and current tobacco use (2 PPD) admitted on 12/02/2023 with shortness of breath. Patient is being treated for recurrent acute on chronic respiratory failure. She is currently on BiPAP.   Of note,  patient was discharged on 5/24 for same acute illness.  This marks her sixth inpatient admission.  SUMMARY OF RECOMMENDATIONS   Full code Continue current scope of care to allow time for family to visit Anticipate transition to comfort in the next 24 to 48 hours after family has decided Will request palliative medicine to follow-up tomorrow at 2:00 for family support and possible transition to comfort  Symptom Management:  Per primary team PMD is available to assist as needed  Code Status: Full code  Prognosis: Hours - Days if compassionate extubation in the next 24 to 48 hours  Discharge Planning: Anticipated Hospital Death  Discussed with: Patient's family, medical team, bedside nursing  Thank you for allowing us  to participate in the care of Laniece Hornbaker PMT will continue to support holistically.  Billing based on MDM: High  Problems Addressed: One acute or chronic illness or injury that poses a threat to life or bodily function  Amount and/or Complexity of Data: Category 1:Review of prior external note(s) from each unique source, Review of the result(s) of each unique test, and Assessment requiring an independent historian(s) and Category 3:Discussion of management or test interpretation with external physician/other qualified health care professional/appropriate source (not separately reported)  Risks: N/A  Detailed review of medical records (labs, imaging, vital signs), medically appropriate exam, discussed with treatment team, counseling and education to patient, family, & staff, documenting clinical information, medication management, coordination of care  Lizbeth Right, NP Palliative Medicine Team  Team Phone # [phone] (Nights/Weekends)  03/04/2021, 8:17 AM

## 2023-12-10 DIAGNOSIS — J441 Chronic obstructive pulmonary disease with (acute) exacerbation: Secondary | ICD-10-CM | POA: Diagnosis not present

## 2023-12-10 DIAGNOSIS — J9622 Acute and chronic respiratory failure with hypercapnia: Secondary | ICD-10-CM | POA: Diagnosis not present

## 2023-12-10 DIAGNOSIS — J9601 Acute respiratory failure with hypoxia: Secondary | ICD-10-CM | POA: Diagnosis not present

## 2023-12-10 DIAGNOSIS — G9341 Metabolic encephalopathy: Secondary | ICD-10-CM | POA: Diagnosis not present

## 2023-12-10 DIAGNOSIS — J9621 Acute and chronic respiratory failure with hypoxia: Secondary | ICD-10-CM | POA: Diagnosis not present

## 2023-12-10 LAB — CBC WITH DIFFERENTIAL/PLATELET
Abs Immature Granulocytes: 0.07 10*3/uL (ref 0.00–0.07)
Basophils Absolute: 0 10*3/uL (ref 0.0–0.1)
Basophils Relative: 0 %
Eosinophils Absolute: 0 10*3/uL (ref 0.0–0.5)
Eosinophils Relative: 0 %
HCT: 37.7 % (ref 36.0–46.0)
Hemoglobin: 10.6 g/dL — ABNORMAL LOW (ref 12.0–15.0)
Immature Granulocytes: 1 %
Lymphocytes Relative: 6 %
Lymphs Abs: 0.7 10*3/uL (ref 0.7–4.0)
MCH: 28.2 pg (ref 26.0–34.0)
MCHC: 28.1 g/dL — ABNORMAL LOW (ref 30.0–36.0)
MCV: 100.3 fL — ABNORMAL HIGH (ref 80.0–100.0)
Monocytes Absolute: 1 10*3/uL (ref 0.1–1.0)
Monocytes Relative: 8 %
Neutro Abs: 10.3 10*3/uL — ABNORMAL HIGH (ref 1.7–7.7)
Neutrophils Relative %: 85 %
Platelets: 117 10*3/uL — ABNORMAL LOW (ref 150–400)
RBC: 3.76 MIL/uL — ABNORMAL LOW (ref 3.87–5.11)
RDW: 14.2 % (ref 11.5–15.5)
WBC: 12.1 10*3/uL — ABNORMAL HIGH (ref 4.0–10.5)
nRBC: 0 % (ref 0.0–0.2)

## 2023-12-10 LAB — GLUCOSE, CAPILLARY
Glucose-Capillary: 173 mg/dL — ABNORMAL HIGH (ref 70–99)
Glucose-Capillary: 258 mg/dL — ABNORMAL HIGH (ref 70–99)
Glucose-Capillary: 236 mg/dL — ABNORMAL HIGH (ref 70–99)

## 2023-12-10 MED ORDER — POLYETHYLENE GLYCOL 3350 17 G PO PACK: 17.0000 g | PACK | Freq: Two times a day (BID) | ORAL | Status: DC

## 2023-12-10 MED ORDER — GLYCOPYRROLATE 0.2 MG/ML IJ SOLN: 0.2000 mg | INTRAMUSCULAR | Status: DC | PRN

## 2023-12-10 MED ORDER — MORPHINE 100MG IN NS 100ML (1MG/ML) PREMIX INFUSION
0.0000 mg/h | INTRAVENOUS | Status: DC
  Administered 2023-12-10: 5 mg/h via INTRAVENOUS
  Filled 2023-12-10: qty 100

## 2023-12-10 MED ORDER — SENNOSIDES-DOCUSATE SODIUM 8.6-50 MG PO TABS
2.0000 | ORAL_TABLET | Freq: Every day | ORAL | Status: DC
  Administered 2023-12-10: 2
  Filled 2023-12-10: qty 2

## 2023-12-10 MED ORDER — POLYVINYL ALCOHOL 1.4 % OP SOLN: 1.0000 [drp] | Freq: Four times a day (QID) | OPHTHALMIC | Status: DC | PRN

## 2023-12-10 MED ORDER — GLYCOPYRROLATE 1 MG PO TABS: 1.0000 mg | ORAL_TABLET | ORAL | Status: DC | PRN

## 2023-12-10 MED ORDER — ACETAMINOPHEN 650 MG RE SUPP: 650.0000 mg | Freq: Four times a day (QID) | RECTAL | Status: DC | PRN

## 2023-12-10 MED ORDER — INSULIN ASPART 100 UNIT/ML IJ SOLN
0.0000 [IU] | INTRAMUSCULAR | Status: DC
  Administered 2023-12-10: 7 [IU] via SUBCUTANEOUS

## 2023-12-10 MED ORDER — MIDAZOLAM HCL 2 MG/2ML IJ SOLN
2.0000 mg | INTRAMUSCULAR | Status: DC | PRN
  Administered 2023-12-10: 2 mg via INTRAVENOUS
  Filled 2023-12-10: qty 4

## 2023-12-10 MED ORDER — MORPHINE BOLUS VIA INFUSION: 5.0000 mg | INTRAVENOUS | Status: DC | PRN

## 2023-12-10 MED ORDER — GLYCOPYRROLATE 0.2 MG/ML IJ SOLN
0.2000 mg | INTRAMUSCULAR | Status: DC | PRN
  Administered 2023-12-10: 0.2 mg via INTRAVENOUS
  Filled 2023-12-10: qty 1

## 2023-12-10 MED ORDER — ACETAMINOPHEN 325 MG PO TABS: 650.0000 mg | ORAL_TABLET | Freq: Four times a day (QID) | ORAL | Status: DC | PRN

## 2023-12-11 LAB — CULTURE, BLOOD (ROUTINE X 2)
Special Requests: ADEQUATE
Special Requests: ADEQUATE

## 2024-01-04 NOTE — Progress Notes (Addendum)
 NAME:  Yvette Booth, MRN:  [id number], DOB:  01-28-1961, LOS: 8 ADMISSION DATE:  12/02/2023, CONSULTATION DATE:  12/02/23 REFERRING MD:  Feliciana Horn, MD, CHIEF COMPLAINT:  Respiratory distress/COPD    History of Present Illness:  63 year old female with history of afib on eliquis , COPD, chronic hypoxic respiratory failure on 2L [place] at home, current smoker, HTN and DVT. Patient was recently admitted to [facility] one week ago for a COPD exacerbation sent home on prednisone , Diamox  and PRN albuterol  and has been admitted 5 times since February of this year for management of her COPD. She presented to the ED this afternoon with respiratory distress; initially restless and sitting in tripod position then became somnolent. Upon my assessment patient is unable to answer questions appropriately, difficult to arouse requiring repeat stimulation to open eyes, is requiring BiPAP; 15/6 50% with tidal volumes in the 200s.  Pertinent  Medical History  COPD, smoker, DVT, Afib on eliquis , CVA, OSA  Significant Hospital Events: Including procedures, antibiotic start and stop dates in addition to other pertinent events   5/29 admit to ICU; Bipap 5/31 remains on BiPAP with ongoing WOB, precedex  for restlessness  5/31 RVP panel positive for parainfluenza and rhinovirus 6/2 remains BiPAP dependent with persistent hypercapnia.  Off Precedex  drip 6/2 intubated for trial of mechanical ventilation following conversation with the family.  6/6 more alert and interactive on ventilator this a.m.  Interim History / Subjective:  Able to follow simple commands on ventilator this morning  Objective    Blood pressure (!) 118/51, pulse 83, temperature (!) 101.4 F (38.6 C), temperature source Axillary, resp. rate 14, height 5\' 3"  (1.6 m), weight 78.2 kg, SpO2 90%.    Vent Mode: PRVC FiO2 (%):  [40 %] 40 % Set Rate:  [15 bmp-16 bmp] 15 bmp Vt Set:  [420 mL] 420 mL PEEP:  [5 cmH20] 5 cmH20 Plateau Pressure:  [17  cmH20-22 cmH20] 20 cmH20   Intake/Output Summary (Last 24 hours) at 12/10/2023 0912 Last data filed at 12/10/2023 0800 Gross per 24 hour  Intake 1699.71 ml  Output 2325 ml  Net -625.29 ml   Filed Weights   12/03/23 2024 12/05/23 0305 12/08/23 0427  Weight: 77 kg 74 kg 78.2 kg   Examination: General: Acute ill-appearing deconditioned middle-aged female lying in bed on mechanical ventilation in no acute distress HEENT: ETT, MM pink/moist, PERRL,  Neuro: Able to follow simple commands on ventilator CV: s1s2 regular rate and rhythm, no murmur, rubs, or gallops,  PULM: Diminished air entry bilaterally, faint expiratory wheeze, tolerating ventilator GI: soft, bowel sounds active in all 4 quadrants, non-tender, non-distended, tolerating TF Extremities: warm/dry, no edema  Skin: no rashes or lesions  Ancillary Tests Personally Reviewed:  ABG now shows compensated hypercarbia.   Assessment and Plan   Septic shock in the setting of staph epi bacteremia - Patient developed a fever 6/4 with Tmax 101.3 this prompted collecting blood cultures 6/4 which revealed positive staph epi and 4 of 4 bottles P: Continue vancomycin Trend CBC and fever curve  Recurrent acute on chronic respiratory failure; hypercarbic and hypoxic; 2/2 AECOPD to rhinovirus/ parainfluenza  P: Mentation much improved this a.m., hopeful for extubation attempt this afternoon to allow for dissipate and goals of care discussion Continue ventilator support with lung protective strategies  Wean PEEP and FiO2 for sats greater than 90%. Head of bed elevated 30 degrees. Plateau pressures less than 30 cm H20.  Follow intermittent chest x-ray and ABG.  SAT/SBT as tolerated, mentation preclude extubation  Ensure adequate pulmonary hygiene  Follow cultures  VAP bundle in place  PAD protocol  Acute metabolic encephalopathy due to hypercarbia -improved History of prior CVA P: Management per neurology  Maintain neuro protective  measures Nutrition and bowel regiment  Aspirations precautions  Minimize sedation  PAF by history currently in SR P: Optimize electrolytes Continuous telemetry  History of DVT P: Full dose Lovenox , home Eliquis  on hold  Hyperthyroidism  P: Continue home Methimazole   Protein calorie malnutrition  P: Continue tube feeds Optimize protein  Hypernatremia P: Hold further diuretics this a.m. Continue free water flushes  Thrombocytopenia P: Continue to trend Monitor for signs of bleeding For now continue Lovenox  Check 4 T-score  Best Practice (right click and "Reselect all SmartList Selections" daily)   Diet/type: NPO - start tube feeds.  DVT prophylaxis full dose lovenox  Pressure ulcer(s): N/A GI prophylaxis: PPI Lines: N/A Foley:  N/A Code Status:  full code  CRITICAL CARE Performed by: Eshal Propps D. Harris   Total critical care time: 38 minutes  Critical care time was exclusive of separately billable procedures and treating other patients.  Critical care was necessary to treat or prevent imminent or life-threatening deterioration.  Critical care was time spent personally by me on the following activities: development of treatment plan with patient and/or surrogate as well as nursing, discussions with consultants, evaluation of patient's response to treatment, examination of patient, obtaining history from patient or surrogate, ordering and performing treatments and interventions, ordering and review of laboratory studies, ordering and review of radiographic studies, pulse oximetry, re-evaluation of patient's condition and participation in multidisciplinary rounds.  Bushra Denman D. Harris, NP-C [facility] Personal contact information can be found on Amion  If no contact or response made please call [phone] 12/10/2023, 9:48 AM

## 2024-01-04 NOTE — Accreditation Note (Signed)
 Death within 24 hours of discontinuation of bilateral soft wrist restraints logged on 12/15/2023 at 1610 by Jaci Martini RN

## 2024-01-04 NOTE — Death Summary Note (Signed)
 DEATH SUMMARY   Patient Details  Name: Yvette Booth MRN: [id number] DOB: 13-Aug-1960  Admission/Discharge Information   Admit Date:  2023/12/24  Date of Death: Date of Death: 2024/01/01  Time of Death: Time of Death: 01/10/1612  Length of Stay: 8  Referring Physician: [facility]   Reason(s) for Hospitalization  Respiratory failure  Diagnoses  Preliminary cause of death: Parainfluenza 3 and rhinovirus pneumonia Secondary Diagnoses (including complications and co-morbidities):  Principal Problem:   Acute respiratory failure with hypoxia (HCC) Active Problems:   COPD exacerbation (HCC)   Acute on chronic respiratory failure (HCC)   Protein-calorie malnutrition, severe   Palliative care by specialist   Brief Hospital Course (including significant findings, care, treatment, and services provided and events leading to death)  Yvette Booth is a 63 y.o. year old female who was in declining health due to advanced oxygen dependent COPD with several recent admissions for acute exacerbations. She presented again with the same with marked wheezing and hypercarbia/hypoxia. RVP was positive for parainfluenza and rhinovirus. She was placed on BIPAP and started on bronchodilators, IV steroids and empiric pneumonia coverage. Despite this she remained hypercarbic. The family requested a short term trial of intubation, understanding that her lung disease was severe enough that she might not be successfully weaned and that if she needed prolonged mechanical ventilation, her quality of life would be very poor thereafter. After a 3 day trial of mechanical ventilation, the patient remained very wheezy and didn't tolerate even as short period of ventilator weaning. The family then decided that a transition to comfort care and compassionate extubation would be most in keeping with the patient's wishes.    Pertinent Labs and Studies  Significant Diagnostic Studies DG Abd Portable 1V Result  Date: 12/06/2023 CLINICAL DATA:  Orogastric tube placement. Respiratory failure with hypoxia and respiratory distress. EXAM: PORTABLE ABDOMEN - 1 VIEW COMPARISON:  None Available. FINDINGS: Limited field of view for tube placement verification purposes. Enteric tube is present with tip projecting over the upper abdomen consistent with location in the distal stomach. Visualized bowel gas pattern is normal with stool seen in the transverse colon. IMPRESSION: Enteric tube projects over the upper abdomen consistent with location in the distal stomach. Electronically Signed   By: Boyce Byes M.D.   On: 12/06/2023 17:24   DG Chest Port 1 View Result Date: 12/06/2023 CLINICAL DATA:  Intubation. Respiratory failure with hypoxia and respiratory distress. EXAM: PORTABLE CHEST 1 VIEW COMPARISON:  2023-12-24 FINDINGS: Endotracheal tube placed with tip measuring 5 cm above the carina. Enteric tube is present. Tip is off the field of view but below the left hemidiaphragm. Normal heart size and pulmonary vascularity. Emphysematous changes in the lungs. Patchy opacities in the lung bases may represent atelectasis or pneumonia. Similar appearance to prior study. No pleural effusion or pneumothorax. Mediastinal contours appear intact. IMPRESSION: Appliances appear in satisfactory position. Emphysematous changes in the lungs. Atelectasis or infiltration in the lung bases. Electronically Signed   By: Boyce Byes M.D.   On: 12/06/2023 17:24   DG Chest Portable 1 View Result Date: 12/24/2023 CLINICAL DATA:  Respiratory distress EXAM: PORTABLE CHEST 1 VIEW COMPARISON:  11/24/2023 FINDINGS: The heart size and mediastinal contours are within normal limits. Both lungs are clear. The visualized skeletal structures are unremarkable. IMPRESSION: No active disease. Electronically Signed   By: Janeece Mechanic M.D.   On: 12/24/2023 13:25   DG Chest 2 View Result Date: 11/24/2023 CLINICAL DATA:  SOB EXAM: CHEST -  2 VIEW COMPARISON:   None available. FINDINGS: No focal airspace consolidation, pleural effusion, or pneumothorax. No cardiomegaly. No acute fracture or destructive lesion. IMPRESSION: No acute cardiopulmonary abnormality. Electronically Signed   By: Rance Burrows M.D.   On: 11/24/2023 16:45    Microbiology Recent Results (from the past 240 hours)  Culture, blood (Routine X 2) w Reflex to ID Panel     Status: Abnormal   Collection Time: 12/08/23  8:01 PM   Specimen: BLOOD  Result Value Ref Range Status   Specimen Description BLOOD SITE NOT SPECIFIED  Final   Special Requests   Final    BOTTLES DRAWN AEROBIC AND ANAEROBIC Blood Culture adequate volume   Culture  Setup Time   Final    GRAM POSITIVE COCCI IN CLUSTERS IN BOTH AEROBIC AND ANAEROBIC BOTTLES CRITICAL RESULT CALLED TO, READ BACK BY AND VERIFIED WITH: PHARMD TONY RUDISILL ON 12/09/23 @ 1930 BY DRT    Culture (A)  Final    STAPHYLOCOCCUS EPIDERMIDIS SUSCEPTIBILITIES PERFORMED ON PREVIOUS CULTURE WITHIN THE LAST 5 DAYS. Performed at [facility], [address] [place], [place] [address]    Report Status 12/11/2023 FINAL  Final  Culture, blood (Routine X 2) w Reflex to ID Panel     Status: Abnormal   Collection Time: 12/08/23  8:32 PM   Specimen: BLOOD  Result Value Ref Range Status   Specimen Description BLOOD SITE NOT SPECIFIED  Final   Special Requests   Final    BOTTLES DRAWN AEROBIC AND ANAEROBIC Blood Culture adequate volume   Culture  Setup Time   Final    GRAM POSITIVE COCCI IN CLUSTERS IN BOTH AEROBIC AND ANAEROBIC BOTTLES CRITICAL RESULT CALLED TO, READ BACK BY AND VERIFIED WITH: Margot Sheng RUDISILL [id number] @ 1825FH Performed at [facility], [address] [place], [place] [address]    Culture STAPHYLOCOCCUS EPIDERMIDIS (A)  Final   Report Status 12/11/2023 FINAL  Final   Organism ID, Bacteria STAPHYLOCOCCUS EPIDERMIDIS  Final      Susceptibility   Staphylococcus epidermidis - MIC*    CIPROFLOXACIN >=8 RESISTANT  Resistant     ERYTHROMYCIN >=8 RESISTANT Resistant     GENTAMICIN >=16 RESISTANT Resistant     OXACILLIN >=4 RESISTANT Resistant     TETRACYCLINE 2 SENSITIVE Sensitive     VANCOMYCIN  4 SENSITIVE Sensitive     TRIMETH/SULFA 80 RESISTANT Resistant     CLINDAMYCIN >=8 RESISTANT Resistant     RIFAMPIN <=0.5 SENSITIVE Sensitive     Inducible Clindamycin NEGATIVE Sensitive     * STAPHYLOCOCCUS EPIDERMIDIS  Blood Culture ID Panel (Reflexed)     Status: Abnormal   Collection Time: 12/08/23  8:32 PM  Result Value Ref Range Status   Enterococcus faecalis NOT DETECTED NOT DETECTED Final   Enterococcus Faecium NOT DETECTED NOT DETECTED Final   Listeria monocytogenes NOT DETECTED NOT DETECTED Final   Staphylococcus species DETECTED (A) NOT DETECTED Final    Comment: CRITICAL RESULT CALLED TO, READ BACK BY AND VERIFIED WITH: PHARMD T. RUDISILL [id number] @ 1825FH    Staphylococcus aureus (BCID) NOT DETECTED NOT DETECTED Final   Staphylococcus epidermidis DETECTED (A) NOT DETECTED Final    Comment: Methicillin (oxacillin) resistant coagulase negative staphylococcus. Possible blood culture contaminant (unless isolated from more than one blood culture draw or clinical case suggests pathogenicity). No antibiotic treatment is indicated for blood  culture contaminants. CRITICAL RESULT CALLED TO, READ BACK BY AND VERIFIED WITH: PHARMD T. RUDISILL [id number] @  1825FH    Staphylococcus lugdunensis NOT DETECTED NOT DETECTED Final   Streptococcus species NOT DETECTED NOT DETECTED Final   Streptococcus agalactiae NOT DETECTED NOT DETECTED Final   Streptococcus pneumoniae NOT DETECTED NOT DETECTED Final   Streptococcus pyogenes NOT DETECTED NOT DETECTED Final   A.calcoaceticus-baumannii NOT DETECTED NOT DETECTED Final   Bacteroides fragilis NOT DETECTED NOT DETECTED Final   Enterobacterales NOT DETECTED NOT DETECTED Final   Enterobacter cloacae complex NOT DETECTED NOT DETECTED Final   Escherichia coli NOT  DETECTED NOT DETECTED Final   Klebsiella aerogenes NOT DETECTED NOT DETECTED Final   Klebsiella oxytoca NOT DETECTED NOT DETECTED Final   Klebsiella pneumoniae NOT DETECTED NOT DETECTED Final   Proteus species NOT DETECTED NOT DETECTED Final   Salmonella species NOT DETECTED NOT DETECTED Final   Serratia marcescens NOT DETECTED NOT DETECTED Final   Haemophilus influenzae NOT DETECTED NOT DETECTED Final   Neisseria meningitidis NOT DETECTED NOT DETECTED Final   Pseudomonas aeruginosa NOT DETECTED NOT DETECTED Final   Stenotrophomonas maltophilia NOT DETECTED NOT DETECTED Final   Candida albicans NOT DETECTED NOT DETECTED Final   Candida auris NOT DETECTED NOT DETECTED Final   Candida glabrata NOT DETECTED NOT DETECTED Final   Candida krusei NOT DETECTED NOT DETECTED Final   Candida parapsilosis NOT DETECTED NOT DETECTED Final   Candida tropicalis NOT DETECTED NOT DETECTED Final   Cryptococcus neoformans/gattii NOT DETECTED NOT DETECTED Final   Methicillin resistance mecA/C DETECTED (A) NOT DETECTED Final    Comment: CRITICAL RESULT CALLED TO, READ BACK BY AND VERIFIED WITH: Margot Sheng RUDISILL [id number] @ 1825FH Performed at [facility], [address], [place], [place] [address]     Lab Basic Metabolic Panel: Recent Labs  Lab 12/09/23 1501  NA 146*  K 3.7  CL 99  CO2 39*  GLUCOSE 271*  BUN 38*  CREATININE 0.90  CALCIUM  8.7*   Liver Function Tests: No results for input(s): AST, ALT, ALKPHOS, BILITOT, PROT, ALBUMIN in the last 168 hours. No results for input(s): LIPASE, AMYLASE in the last 168 hours. No results for input(s): AMMONIA in the last 168 hours. CBC: Recent Labs  Lab 12/09/2023 0433  WBC 12.1*  NEUTROABS 10.3*  HGB 10.6*  HCT 37.7  MCV 100.3*  PLT 117*   Cardiac Enzymes: No results for input(s): CKTOTAL, CKMB, CKMBINDEX, TROPONINI in the last 168 hours. Sepsis Labs: Recent Labs  Lab 12/09/2023 0433  WBC 12.1*     Procedures/Operations  Intubation, mechanical ventilation.    Yvette Booth 12/15/2023, 3:18 PM

## 2024-01-04 NOTE — IPAL (Signed)
  Interdisciplinary Goals of Care Family Meeting   Date carried out: 12/08/2023  Location of the meeting: Unit  Member's involved: Nurse Practitioner, Bedside Registered Nurse, Family Member or next of kin, and Palliative care team member  Durable Power of Attorney or acting medical decision maker:    Yvette Booth - sister POA   Discussion: We met at bedside this a.m. to discuss goals of care for [name].  Fortunately this morning Yvette Booth is completely alert and interactive on ventilator she appears appropriate and answers all questions appropriately.  Therefore conversation revolved around [name].  I relayed to her that I believe her COPD is now end-stage and that she is not going to be successfully extubated.  Therefore I recommended transition to comfort care with a one-way extubation.  She indicated she understood.  Yvette Booth was at bedside and confirms Yvette Booth's agreement to plan.  Orders placed to transition to DNR with comfort care approach.  Code status:   Code Status: Do not attempt resuscitation (DNR) - Comfort care   Disposition: In-patient comfort care  Time spent for the meeting: 35 min  Darelle Kings D. Harris, NP-C [facility] Personal contact information can be found on Amion  If no contact or response made please call [phone] 01/02/2024, 2:57 PM

## 2024-01-04 NOTE — Progress Notes (Addendum)
 This chaplain responded to PMT NP-Dawn's consult for spiritual care. Family is at the bedside. The chaplain understands the Pt. expressed her desire for one way extubation and comfort care. The Pt. HCPOA-[name] joined the Pt. and medical team in confirming the Pt. goals of care.  The Pt. and family accepted the invitation for the chaplain's bedside presence and prayer. The chaplain listened reflectively to [name] share her own anticipatory grief and the peace in hearing the Pt. share her voice in the decision.   This chaplain is available for F/U spiritual care as needed.  **[id number] This chaplain responded to Pt. Death after an update from the PMT and RN. The Pt. sisters and mothers are at the bedside. Affirmation of the Pt. passing and peace was shared with [name] and the family.   Chaplain Kathleene Papas [phone]

## 2024-01-04 NOTE — Progress Notes (Signed)
 75 mL of morphine  wasted in steri-cycle with Myrtis Atlas and this RN.

## 2024-01-04 NOTE — Inpatient Diabetes Management (Signed)
 Inpatient Diabetes Program Recommendations  AACE/ADA: New Consensus Statement on Inpatient Glycemic Control (2015)  Target Ranges:  Prepandial:   less than 140 mg/dL      Peak postprandial:   less than 180 mg/dL (1-2 hours)      Critically ill patients:  140 - 180 mg/dL   Lab Results  Component Value Date   GLUCAP 236 (H) 12/22/2023   HGBA1C 5.1 08/21/2023    Review of Glycemic Control  Latest Reference Range & Units 12/09/23 11:25 12/09/23 15:34 12/09/23 19:49 12/09/23 23:44 12/31/2023 03:42 12/31/2023 07:36 12/23/2023 11:41  Glucose-Capillary 70 - 99 mg/dL 829 (H) 562 (H) 130 (H) 168 (H) 173 (H) 258 (H) 236 (H)   Diabetes history: None Outpatient Diabetes medications:  None Current orders for Inpatient glycemic control:  Novolog  0-20 units q 4 hours Osmolite 50 ml/hr Prednisone  20 mg daily Inpatient Diabetes Program Recommendations:    Note elevated blood sugars with steroids and tube feeds.  Novolog  correction increased today to resistant (0-20 units) q 4 hours.  If CBG's continue to be elevated, may need addition of tube feed coverage.   Thanks,  Josefa Ni, RN, BC-ADM Inpatient Diabetes Coordinator Pager [phone]  (8a-5p)

## 2024-01-04 NOTE — Progress Notes (Signed)
 Daily Progress Note   Patient Name: Yvette Booth       Date: 12/10/2023 DOB: [birth year]  Age: 63 y.o. MRN#: [id number] Attending Physician: [name], MD Primary Care Physician: [facility] Admit Date: 12/02/2023  Reason for Consultation/Follow-up: Establishing goals of care   Length of Stay: 8  Current Medications: Scheduled Meds:   arformoterol   15 mcg Nebulization BID   budesonide  (PULMICORT ) nebulizer solution  0.5 mg Nebulization BID   Chlorhexidine  Gluconate Cloth  6 each Topical Daily   free water  100 mL Per Tube Q4H   mouth rinse  15 mL Mouth Rinse Q2H   pantoprazole  (PROTONIX ) IV  40 mg Intravenous Q24H   polyethylene glycol  17 g Per Tube BID   revefenacin   175 mcg Nebulization Daily   senna-docusate  2 tablet Per Tube Daily   thiamine  100 mg Per Tube Daily    Continuous Infusions:  morphine     vancomycin Stopped (12/10/23 1021)    PRN Meds: acetaminophen  **OR** acetaminophen , acetaminophen , albuterol , artificial tears, glycopyrrolate **OR** glycopyrrolate **OR** glycopyrrolate, midazolam, morphine, nicotine , mouth rinse  Physical Exam Vitals reviewed.  Constitutional:      General: She is not in acute distress.    Appearance: She is ill-appearing.     Interventions: She is intubated.  HENT:     Head: Normocephalic and atraumatic.  Cardiovascular:     Rate and Rhythm: Normal rate.  Pulmonary:     Effort: She is intubated.  Skin:    General: Skin is warm and dry.  Neurological:     Mental Status: She is alert.  Psychiatric:        Behavior: Behavior normal.             Vital Signs: BP 139/78   Pulse 95   Temp (!) 101.3 F (38.5 C) (Axillary)   Resp 19   Ht 5\' 3"  (1.6 m)   Wt 78.2 kg   SpO2 93%   BMI 30.54 kg/m   SpO2: SpO2: 93 % O2 Device: O2 Device: Ventilator O2 Flow Rate: O2 Flow Rate (L/min): 50 L/min       Palliative Assessment/Data: 10%      Patient Active Problem List   Diagnosis Date Noted   Palliative care by specialist 12/07/2023   Protein-calorie  malnutrition, severe 12/06/2023   Acute respiratory failure with hypoxia (HCC) 12/02/2023   Acute on chronic respiratory failure (HCC) 12/02/2023   Chronic respiratory failure with hypoxia and hypercapnia (HCC) - on baseline 3 L/min, baseline PCO2 about 95 mmHg 11/24/2023   COPD exacerbation (HCC) 11/24/2023   Tobacco abuse 11/15/2023   Hyperthyroidism 08/21/2023   PAF (paroxysmal atrial fibrillation) (HCC) 08/21/2023   Obstructive sleep apnea treated with bilevel positive airway pressure (BiPAP) 08/21/2023   History of CVA (cerebrovascular accident) 08/21/2023   Acute on chronic respiratory failure with hypoxia and hypercapnia (HCC) 08/20/2023   Encounter for smoking cessation counseling 08/20/2023    Palliative Care Assessment & Plan   Patient Profile: 63 y.o. female  with past medical history of COPD (3L nasal cannula at baseline), RV failure, OSA (BiPAP nightly), PAF (Eliquis ), history of CVA, hyperthyroidism, HTN, DVT, and current tobacco use (2 PPD) admitted on 12/02/2023 with shortness of breath. Patient is being treated for recurrent acute on chronic respiratory failure. She is currently intubated.   Of note, patient was discharged on 5/24 for same acute illness.  This marks her sixth inpatient admission.  Today's Discussion: Met at bedside with patient, her sister [name]/HCPOA, [name] [name] and bedside RN.  [name] reviewed the patient's current hospitalization. She shared her concern that despite our best efforts and aggressive care the patient has not shown significant improvement. She confirmed with the patient that she would never want a tracheostomy. She discussed what compassionate extubation would look like  for this patient. Both patient and sister [name] agreed to compassionate extubation.  [name] confirm DNR status. [name] discussed available medications to ensure her the patient's end-of-life is comfortable. They would like chaplain support for spiritual care consulted. PMT will continue to support. Encouraged [name] and family to call PMT with needs. PMT will continue to support.    Recommendations/Plan: Changed to DNR Transition to comfort measures Spiritual care consult PMT will continue to follow  Code Status:    Code Status Orders  (From admission, onward)           Start     Ordered   12/10/23 1415  Do not attempt resuscitation (DNR) - Comfort care  Continuous       Question Answer Comment  If patient has no pulse and is not breathing Do Not Attempt Resuscitation   In Pre-Arrest Conditions (Patient Is Breathing and Has a Pulse) Provide comfort measures. Relieve any mechanical airway obstruction. Avoid transfer unless required for comfort.   Consent: Discussion documented in EHR or advanced directives reviewed      12/10/23 1419         Extensive chart review has been completed prior to seeing the patient including labs, vital signs, imaging, progress/consult notes, orders, medications, and available advance directive documents.  Care plan was discussed with bedside RN and [name] NP  Time spent: 35 minutes  Thank you for allowing the Palliative Medicine Team to assist in the care of this patient.    [name], NP  Please contact Palliative Medicine Team phone at [phone] for questions and concerns.

## 2024-01-04 NOTE — Progress Notes (Signed)
 Pt extubated to comfort care per CCM order. RN at bedside.

## 2024-01-04 DEATH — deceased
